# Patient Record
Sex: Male | Born: 1935 | Race: Black or African American | Hispanic: No | Marital: Single | Smoking: Former smoker
Health system: Southern US, Community
[De-identification: ages and names within clinical notes are randomized; demographics above are authoritative.]

## PROBLEM LIST (undated history)

## (undated) DIAGNOSIS — L89309 Pressure ulcer of unspecified buttock, unspecified stage: Secondary | ICD-10-CM | POA: Diagnosis present

## (undated) DIAGNOSIS — D649 Anemia, unspecified: Secondary | ICD-10-CM | POA: Diagnosis present

## (undated) DIAGNOSIS — N390 Urinary tract infection, site not specified: Secondary | ICD-10-CM | POA: Diagnosis present

## (undated) DIAGNOSIS — R1310 Dysphagia, unspecified: Secondary | ICD-10-CM | POA: Diagnosis present

## (undated) DIAGNOSIS — E7800 Pure hypercholesterolemia, unspecified: Secondary | ICD-10-CM

## (undated) DIAGNOSIS — Z87891 Personal history of nicotine dependence: Secondary | ICD-10-CM

## (undated) DIAGNOSIS — E119 Type 2 diabetes mellitus without complications: Secondary | ICD-10-CM | POA: Diagnosis present

## (undated) DIAGNOSIS — G9340 Encephalopathy, unspecified: Secondary | ICD-10-CM

## (undated) DIAGNOSIS — M1990 Unspecified osteoarthritis, unspecified site: Secondary | ICD-10-CM

## (undated) DIAGNOSIS — E785 Hyperlipidemia, unspecified: Secondary | ICD-10-CM | POA: Diagnosis present

## (undated) DIAGNOSIS — Y92009 Unspecified place in unspecified non-institutional (private) residence as the place of occurrence of the external cause: Secondary | ICD-10-CM

## (undated) DIAGNOSIS — Z960 Presence of urogenital implants: Secondary | ICD-10-CM

## (undated) DIAGNOSIS — I639 Cerebral infarction, unspecified: Secondary | ICD-10-CM

## (undated) DIAGNOSIS — I10 Essential (primary) hypertension: Secondary | ICD-10-CM | POA: Diagnosis present

## (undated) DIAGNOSIS — L8992 Pressure ulcer of unspecified site, stage 2: Secondary | ICD-10-CM | POA: Diagnosis present

## (undated) DIAGNOSIS — T83511A Infection and inflammatory reaction due to indwelling urethral catheter, initial encounter: Principal | ICD-10-CM | POA: Diagnosis present

## (undated) DIAGNOSIS — L8993 Pressure ulcer of unspecified site, stage 3: Secondary | ICD-10-CM | POA: Diagnosis present

## (undated) DIAGNOSIS — I4891 Unspecified atrial fibrillation: Secondary | ICD-10-CM

## (undated) DIAGNOSIS — L8995 Pressure ulcer of unspecified site, unstageable: Secondary | ICD-10-CM | POA: Diagnosis present

## (undated) DIAGNOSIS — Z978 Presence of other specified devices: Secondary | ICD-10-CM

## (undated) DIAGNOSIS — L89109 Pressure ulcer of unspecified part of back, unspecified stage: Secondary | ICD-10-CM | POA: Diagnosis present

## (undated) DIAGNOSIS — S2239XA Fracture of one rib, unspecified side, initial encounter for closed fracture: Secondary | ICD-10-CM

## (undated) DIAGNOSIS — I69959 Hemiplegia and hemiparesis following unspecified cerebrovascular disease affecting unspecified side: Secondary | ICD-10-CM

## (undated) DIAGNOSIS — E860 Dehydration: Secondary | ICD-10-CM | POA: Diagnosis present

## (undated) DIAGNOSIS — J189 Pneumonia, unspecified organism: Secondary | ICD-10-CM

## (undated) DIAGNOSIS — L89609 Pressure ulcer of unspecified heel, unspecified stage: Secondary | ICD-10-CM | POA: Diagnosis present

## (undated) DIAGNOSIS — Y846 Urinary catheterization as the cause of abnormal reaction of the patient, or of later complication, without mention of misadventure at the time of the procedure: Secondary | ICD-10-CM | POA: Diagnosis present

## (undated) HISTORY — PX: NO PAST SURGERIES: SHX2092

---

## 2013-12-15 DIAGNOSIS — J189 Pneumonia, unspecified organism: Secondary | ICD-10-CM

## 2013-12-15 HISTORY — DX: Pneumonia, unspecified organism: J18.9

## 2013-12-28 ENCOUNTER — Emergency Department: Payer: Self-pay | Admitting: Emergency Medicine

## 2013-12-28 LAB — URINALYSIS, COMPLETE
BACTERIA: NONE SEEN
BILIRUBIN, UR: NEGATIVE
Glucose,UR: NEGATIVE mg/dL (ref 0–75)
Leukocyte Esterase: NEGATIVE
NITRITE: NEGATIVE
Ph: 5 (ref 4.5–8.0)
RBC,UR: 8 /HPF (ref 0–5)
Specific Gravity: 1.029 (ref 1.003–1.030)
Squamous Epithelial: NONE SEEN
WBC UR: 1 /HPF (ref 0–5)

## 2013-12-28 LAB — COMPREHENSIVE METABOLIC PANEL
ANION GAP: 12 (ref 7–16)
Albumin: 3.7 g/dL (ref 3.4–5.0)
Alkaline Phosphatase: 74 U/L
BUN: 28 mg/dL — AB (ref 7–18)
Bilirubin,Total: 0.7 mg/dL (ref 0.2–1.0)
CALCIUM: 9.5 mg/dL (ref 8.5–10.1)
CHLORIDE: 101 mmol/L (ref 98–107)
CO2: 24 mmol/L (ref 21–32)
CREATININE: 0.96 mg/dL (ref 0.60–1.30)
EGFR (African American): >60
Glucose: 103 mg/dL — ABNORMAL HIGH (ref 65–99)
OSMOLALITY: 280 (ref 275–301)
Potassium: 4.4 mmol/L (ref 3.5–5.1)
SGOT(AST): 112 U/L — ABNORMAL HIGH (ref 15–37)
SGPT (ALT): 40 U/L (ref 12–78)
SODIUM: 137 mmol/L (ref 136–145)
TOTAL PROTEIN: 7.4 g/dL (ref 6.4–8.2)

## 2013-12-28 LAB — CBC
HCT: 52.7 % — AB (ref 40.0–52.0)
HGB: 17.5 g/dL (ref 13.0–18.0)
MCH: 31.5 pg (ref 26.0–34.0)
MCHC: 33.2 g/dL (ref 32.0–36.0)
MCV: 95 fL (ref 80–100)
PLATELETS: 328 10*3/uL (ref 150–440)
RBC: 5.56 10*6/uL (ref 4.40–5.90)
RDW: 13.6 % (ref 11.5–14.5)
WBC: 10.6 10*3/uL (ref 3.8–10.6)

## 2013-12-28 LAB — MAGNESIUM: MAGNESIUM: 1.8 mg/dL

## 2013-12-28 LAB — CK TOTAL AND CKMB (NOT AT ARMC)
CK, Total: 2202 U/L — ABNORMAL HIGH
CK-MB: 54.6 ng/mL — AB (ref 0.5–3.6)

## 2013-12-28 LAB — PROTIME-INR
INR: 1.2
PROTHROMBIN TIME: 14.7 s (ref 11.5–14.7)

## 2013-12-28 LAB — TROPONIN I: TROPONIN-I: 0.05 ng/mL

## 2013-12-29 DIAGNOSIS — S2239XA Fracture of one rib, unspecified side, initial encounter for closed fracture: Secondary | ICD-10-CM

## 2013-12-29 DIAGNOSIS — I639 Cerebral infarction, unspecified: Secondary | ICD-10-CM

## 2013-12-29 HISTORY — DX: Cerebral infarction, unspecified: I63.9

## 2013-12-29 HISTORY — DX: Fracture of one rib, unspecified side, initial encounter for closed fracture: S22.39XA

## 2014-01-09 ENCOUNTER — Emergency Department (HOSPITAL_COMMUNITY): Payer: Medicare Other

## 2014-01-09 ENCOUNTER — Inpatient Hospital Stay (HOSPITAL_COMMUNITY): Payer: Medicare Other

## 2014-01-09 ENCOUNTER — Other Ambulatory Visit: Payer: Self-pay

## 2014-01-09 ENCOUNTER — Inpatient Hospital Stay (HOSPITAL_COMMUNITY)
Admission: EM | Admit: 2014-01-09 | Discharge: 2014-01-14 | DRG: 189 | Disposition: A | Discharge status: Skilled Nursing Facility | Payer: Medicare Other | Attending: Internal Medicine | Admitting: Internal Medicine

## 2014-01-09 ENCOUNTER — Encounter (HOSPITAL_COMMUNITY): Payer: Self-pay | Admitting: Emergency Medicine

## 2014-01-09 DIAGNOSIS — E785 Hyperlipidemia, unspecified: Secondary | ICD-10-CM | POA: Diagnosis present

## 2014-01-09 DIAGNOSIS — D72829 Elevated white blood cell count, unspecified: Secondary | ICD-10-CM | POA: Diagnosis present

## 2014-01-09 DIAGNOSIS — I69991 Dysphagia following unspecified cerebrovascular disease: Secondary | ICD-10-CM

## 2014-01-09 DIAGNOSIS — J9600 Acute respiratory failure, unspecified whether with hypoxia or hypercapnia: Principal | ICD-10-CM | POA: Diagnosis present

## 2014-01-09 DIAGNOSIS — J9691 Respiratory failure, unspecified with hypoxia: Secondary | ICD-10-CM | POA: Diagnosis present

## 2014-01-09 DIAGNOSIS — I69959 Hemiplegia and hemiparesis following unspecified cerebrovascular disease affecting unspecified side: Secondary | ICD-10-CM

## 2014-01-09 DIAGNOSIS — J189 Pneumonia, unspecified organism: Secondary | ICD-10-CM | POA: Diagnosis present

## 2014-01-09 DIAGNOSIS — I4891 Unspecified atrial fibrillation: Secondary | ICD-10-CM | POA: Diagnosis present

## 2014-01-09 DIAGNOSIS — Z7901 Long term (current) use of anticoagulants: Secondary | ICD-10-CM

## 2014-01-09 DIAGNOSIS — Z823 Family history of stroke: Secondary | ICD-10-CM

## 2014-01-09 DIAGNOSIS — G9349 Other encephalopathy: Secondary | ICD-10-CM | POA: Diagnosis present

## 2014-01-09 DIAGNOSIS — G9340 Encephalopathy, unspecified: Secondary | ICD-10-CM | POA: Diagnosis present

## 2014-01-09 DIAGNOSIS — E119 Type 2 diabetes mellitus without complications: Secondary | ICD-10-CM | POA: Diagnosis present

## 2014-01-09 DIAGNOSIS — M25569 Pain in unspecified knee: Secondary | ICD-10-CM | POA: Diagnosis present

## 2014-01-09 DIAGNOSIS — R1310 Dysphagia, unspecified: Secondary | ICD-10-CM | POA: Diagnosis present

## 2014-01-09 DIAGNOSIS — I6340 Cerebral infarction due to embolism of unspecified cerebral artery: Secondary | ICD-10-CM | POA: Diagnosis present

## 2014-01-09 DIAGNOSIS — Z79899 Other long term (current) drug therapy: Secondary | ICD-10-CM

## 2014-01-09 DIAGNOSIS — I69992 Facial weakness following unspecified cerebrovascular disease: Secondary | ICD-10-CM

## 2014-01-09 DIAGNOSIS — I10 Essential (primary) hypertension: Secondary | ICD-10-CM | POA: Diagnosis present

## 2014-01-09 DIAGNOSIS — E870 Hyperosmolality and hypernatremia: Secondary | ICD-10-CM | POA: Diagnosis present

## 2014-01-09 DIAGNOSIS — I639 Cerebral infarction, unspecified: Secondary | ICD-10-CM

## 2014-01-09 HISTORY — DX: Fracture of one rib, unspecified side, initial encounter for closed fracture: S22.39XA

## 2014-01-09 HISTORY — DX: Unspecified atrial fibrillation: I48.91

## 2014-01-09 HISTORY — DX: Cerebral infarction, unspecified: I63.9

## 2014-01-09 HISTORY — DX: Essential (primary) hypertension: I10

## 2014-01-09 LAB — CBC WITH DIFFERENTIAL/PLATELET
BASOS ABS: 0 10*3/uL (ref 0.0–0.1)
Basophils Relative: 0 % (ref 0–1)
EOS ABS: 0 10*3/uL (ref 0.0–0.7)
Eosinophils Relative: 0 % (ref 0–5)
HCT: 37.9 % — ABNORMAL LOW (ref 39.0–52.0)
Hemoglobin: 13.1 g/dL (ref 13.0–17.0)
LYMPHS ABS: 1.8 10*3/uL (ref 0.7–4.0)
Lymphocytes Relative: 6 % — ABNORMAL LOW (ref 12–46)
MCH: 32.3 pg (ref 26.0–34.0)
MCHC: 34.6 g/dL (ref 30.0–36.0)
MCV: 93.6 fL (ref 78.0–100.0)
MONO ABS: 1.5 10*3/uL — AB (ref 0.1–1.0)
MONOS PCT: 5 % (ref 3–12)
Neutro Abs: 27.1 10*3/uL — ABNORMAL HIGH (ref 1.7–7.7)
Neutrophils Relative %: 89 % — ABNORMAL HIGH (ref 43–77)
PLATELETS: 415 10*3/uL — AB (ref 150–400)
RBC: 4.05 MIL/uL — ABNORMAL LOW (ref 4.22–5.81)
RDW: 13.5 % (ref 11.5–15.5)
WBC: 30.4 10*3/uL — AB (ref 4.0–10.5)

## 2014-01-09 LAB — URINALYSIS, ROUTINE W REFLEX MICROSCOPIC
Bilirubin Urine: NEGATIVE
GLUCOSE, UA: NEGATIVE mg/dL
Hgb urine dipstick: NEGATIVE
Ketones, ur: NEGATIVE mg/dL
LEUKOCYTES UA: NEGATIVE
Nitrite: NEGATIVE
PH: 6 (ref 5.0–8.0)
Protein, ur: NEGATIVE mg/dL
Specific Gravity, Urine: 1.027 (ref 1.005–1.030)
Urobilinogen, UA: 1 mg/dL (ref 0.0–1.0)

## 2014-01-09 LAB — COMPREHENSIVE METABOLIC PANEL
ALBUMIN: 2.5 g/dL — AB (ref 3.5–5.2)
ALT: 80 U/L — ABNORMAL HIGH (ref 0–53)
AST: 90 U/L — ABNORMAL HIGH (ref 0–37)
Alkaline Phosphatase: 173 U/L — ABNORMAL HIGH (ref 39–117)
BUN: 33 mg/dL — AB (ref 6–23)
CALCIUM: 9.3 mg/dL (ref 8.4–10.5)
CHLORIDE: 100 meq/L (ref 96–112)
CO2: 22 mEq/L (ref 19–32)
CREATININE: 1.23 mg/dL (ref 0.50–1.35)
GFR calc Af Amer: 61 mL/min — ABNORMAL LOW (ref >90)
GFR calc non Af Amer: 52 mL/min — ABNORMAL LOW (ref >90)
Glucose, Bld: 149 mg/dL — ABNORMAL HIGH (ref 70–99)
Potassium: 5.3 mEq/L (ref 3.7–5.3)
Sodium: 137 mEq/L (ref 137–147)
Total Bilirubin: 0.3 mg/dL (ref 0.3–1.2)
Total Protein: 7.1 g/dL (ref 6.0–8.3)

## 2014-01-09 LAB — PROTIME-INR
INR: 1.23 (ref 0.00–1.49)
PROTHROMBIN TIME: 15.2 s (ref 11.6–15.2)

## 2014-01-09 LAB — I-STAT CG4 LACTIC ACID, ED: LACTIC ACID, VENOUS: 1.58 mmol/L (ref 0.5–2.2)

## 2014-01-09 MED ORDER — PIPERACILLIN-TAZOBACTAM 3.375 G IVPB 30 MIN
3.3750 g | Freq: Once | INTRAVENOUS | Status: AC
  Administered 2014-01-09: 3.375 g via INTRAVENOUS
  Filled 2014-01-09: qty 50

## 2014-01-09 MED ORDER — VANCOMYCIN HCL 10 G IV SOLR
1500.0000 mg | Freq: Once | INTRAVENOUS | Status: AC
  Administered 2014-01-09: 1500 mg via INTRAVENOUS
  Filled 2014-01-09: qty 1500

## 2014-01-09 NOTE — ED Provider Notes (Signed)
CSN: [id number]     Arrival date & time 01/09/14  1809 History   First MD Initiated Contact with Patient 01/09/14 1810     Chief Complaint  Patient presents with  . Altered Mental Status  . Fever    HPI 80 year old male presents with fever and altered male status. He was admitted to [facility] a few days ago with a stroke. He was found laying in the floor covered in urine. His family had not seen him for about 3 days and they called the police. The police had to break the door down. He was found to have a right MCA ischemic stroke and was newly diagnosed with atrial fibrillation. Anticoagulation was deferred due to the fact that he had an epidural placed for pain management during his hospitalization pain control related to multiple rib fractures from his fall. He has dysphagia, mild left-sided facial droop, and dense left hemiparesis from his stroke.   He was brought in today by family from his nursing home via EMS. They are concerned that he has been lethargic, slightly confused and disoriented. He reportedly had a fever of 101.8 at the nursing home earlier today and nursing home notes indicate that he was hypoxic to 81%. Of note he is satting 90% on room air currently in the emergency department.  The history is limited as the patient is slightly altered. He is oriented to person, time, situation. He thinks that he is in the hospital at [facility] currently. He is somnolent and falls asleep easily during conversation and has poor attention. The history is mostly obtained from EMS and from his daughter. Also obtained records from [facility] for further history. He denies headache, chest pain, abdominal pain, nausea, vomiting. He denies dysuria.    Past Medical History  Diagnosis Date  . Stroke   . A-fib   . Diabetes mellitus without complication   . Hypertension   . Rib fracture    No past surgical history on file. No family history on file. History  Substance Use Topics  . Smoking status: Not  on file  . Smokeless tobacco: Not on file  . Alcohol Use: Not on file    Review of Systems  Unable to perform ROS: Mental status change      Allergies  Review of patient's allergies indicates no known allergies.  Home Medications   No current outpatient prescriptions on file. BP 145/60  Pulse 77  Temp(Src) 98.5 F (36.9 C) (Oral)  Resp 16  Ht 5\' 11"  (1.803 m)  Wt 188 lb 9.6 oz (85.548 kg)  BMI 26.32 kg/m2  SpO2 100% Physical Exam  Nursing note and vitals reviewed. Constitutional: He appears well-developed. No distress.  Ill appearing, lethargic, disoriented  HENT:  Head: Normocephalic and atraumatic.  Mouth/Throat: Oropharynx is clear and moist.  L facial droop  Eyes: Conjunctivae are normal. Pupils are equal, round, and reactive to light.  Neck: Normal range of motion. Neck supple. No JVD present.  Cardiovascular: Normal rate and intact distal pulses.  An irregularly irregular rhythm present. Exam reveals no gallop and no friction rub.   No murmur heard. Pulmonary/Chest: No accessory muscle usage. Tachypnea noted. No respiratory distress. He has no decreased breath sounds. He has no wheezes. He has rhonchi.  Abdominal: Soft. He exhibits no distension. There is no tenderness. There is no rebound and no guarding.  Musculoskeletal: He exhibits no edema.  Neurological:  Lethargic; oriented to person but not place, time, or situation.  Disordered thinking.  Poor attention.  Dense L upper and lower extremitiy hemiparesis.  L facial droop.  Protecting his airway, handling secretions.  Skin: Skin is warm and dry.    ED Course  Procedures (including critical care time) Labs Review Labs Reviewed  CBC WITH DIFFERENTIAL - Abnormal; Notable for the following:    WBC 30.4 (*)    RBC 4.05 (*)    HCT 37.9 (*)    Platelets 415 (*)    Neutrophils Relative % 89 (*)    Lymphocytes Relative 6 (*)    Neutro Abs 27.1 (*)    Monocytes Absolute 1.5 (*)    All other components  within normal limits  COMPREHENSIVE METABOLIC PANEL - Abnormal; Notable for the following:    Glucose, Bld 149 (*)    BUN 33 (*)    Albumin 2.5 (*)    AST 90 (*)    ALT 80 (*)    Alkaline Phosphatase 173 (*)    GFR calc non Af Amer 52 (*)    GFR calc Af Amer 61 (*)    All other components within normal limits  CULTURE, BLOOD (ROUTINE X 2)  CULTURE, BLOOD (ROUTINE X 2)  URINALYSIS, ROUTINE W REFLEX MICROSCOPIC  PROTIME-INR  I-STAT CG4 LACTIC ACID, ED   Imaging Review Dg Chest 2 View  01/09/2014   CLINICAL DATA:  Altered mental status.  Fever.  EXAM: CHEST  2 VIEW  COMPARISON:  CT CHEST W/O CM dated 12/28/2013  FINDINGS: The cardiac silhouette is enlarged. The lungs are mildly hypoinflated with mild bibasilar opacities. No pleural effusion or pneumothorax is identified. Minimally displaced left-sided rib fractures are better demonstrated on recent CT.  IMPRESSION: Hypoinflation with mild bibasilar opacity, likely atelectasis. No pneumothorax identified.   Electronically Signed   By: Sebastian AcheAllen  Grady   On: 01/09/2014 19:38   Ct Head Wo Contrast  01/09/2014   CLINICAL DATA:  Altered mental status and fever.  EXAM: CT HEAD WITHOUT CONTRAST  TECHNIQUE: Contiguous axial images were obtained from the base of the skull through the vertex without intravenous contrast.  COMPARISON:  12/28/2013  FINDINGS: Hypoattenuation involving the right basal ganglia is stable to slightly decreased compared to the prior study. Periventricular white matter hypodensities do not appear significantly changed and are compatible with moderate chronic small vessel ischemic disease. There is no evidence of acute intracranial hemorrhage. Cerebral atrophy is unchanged. There is no midline shift or extra-axial fluid collection. Orbits are unremarkable. Mastoid air cells are clear. Small amount of frothy secretion is present in the left sphenoid sinus.  IMPRESSION: 1. No evidence of acute intracranial hemorrhage or definite new  infarct. 2. Stable to slightly decreased prominence right basal ganglia hypoattenuation, which may reflect evolving infarct.   Electronically Signed   By: Sebastian AcheAllen  Grady   On: 01/09/2014 20:41     EKG Interpretation None      MDM   80 yo M, [facility] with [facility] and multiple rib fractures, here with acute encephalopathy, febrile illness, and hypoxia.    On exam, he is afebrile here, but reported T of 101.8 at nursing facility prior to arrival.  He is mildly tachypneic. sats on room air, 90% (no prior O2 requirement).  Normal BP in the 100s/50s.  He is lethargic.  Oriented to person.  Not place, or time or situation.  Poor concentration.  Disordered thinking.  Ill appearing.  No distress.  No resp distress.  L facial droop and dense LUE and LLE  hemiparesis (similar to discharge exam documented at discharge from [facility], unchanged since discharge per his daughter).    Obtained blood cultures.   Gave vancomycin, zosyn, and IVF bolus. I reviewed all labs and imaging as detailed above. ED workup pertinent for: leukocytosis, WBC 30.  Elevated BUN to 30.  Mildly elevated transaminasis.  Normal lactate.  Normal urine.  CT head with no new hemorrhage; possible small evolving R basal ganglia infarct, but similar to CT from [facility].    Impression: Final diagnoses:  Acute encephalopathy  Atrial fibrillation  HTN (hypertension)  Leukocytosis  PNA (pneumonia)  Respiratory failure with hypoxia   Admitted to hospitalist for further management.    Toney Sang, MD 01/10/14 1535

## 2014-01-09 NOTE — H&P (Addendum)
[facility] History and Physical  Angie Favaeter Breon ZOX:[id number] DOB: 08/28/1928 DOA: 01/09/2014  Referring physician: ER physician PCP: No primary provider on file.   Chief Complaint: altered mental status  HPI:  80 year old male with past medical history of recent CVA (right MCA stroke), [facility], was found to be in atrial fibrillation and is now on Eliquis for anticoagulation who [facility]09[facility] with complaints of increased lethargy and confusion as noted by his family for past 24 hours. Patient is from nursing home and per SNF report patient had a fever there of 101.8 F and his oxygen saturation was 81%. There was no respiratory distress. No complaints of chest pain, palpitations of shortness of breath. No cough. No abdominal pain, no nausea or vomiting. No diarrhea and no blood in stool or urine.  In ED, BP was 107/55, HR 80, Tmax 100.108F and oxygen saturation 91% on 2 L [place] oxygen support. CXR shoed possible basilar infiltrates. CT head did not reveal acute intracranial findings but it did show possible evolving infarct in right basal ganglia.  Blood work revealed leukocytosis of 30 otherwise unremarkable. Urinalysis did not show evidence of an infection. Patient was started on broad spectrum antibiotics for possible pneumonia.   Assessment and Plan:  Principal Problem:   Acute respiratory failure with hypoxia - likely secondary to pneumonia (HCAP) - patient was started on broad spectrum antibiotics, vanco and zosyn - resp status is stable at this time - continue oxygen support via nasal canula to keep O2 saturation above 90% Active Problems:   Acute encephalopathy - possible related to pneumonia but evolving infarct also possible - treatment is with vanco and zosyn for pneumonia; would also order MRI brain  - PT/OT eval once pt able to participate - also needs SLP eval prior to initiating PO meds   Possible CVA - evolving infarct seen on CT  head - obtain MRI brain; will see if neurology needs to be consulted once results are available   HCAP - pneumonia order set in place - continue vanco and zosyn - follow up blood culture results, legionella, influenza, strep pneumoniae results - oxygen support via nasal canula to keep O2 saturation above 90%   Atrial fibrillation - continue coreg and Eliquis   Left knee pain - obtain x ray    HTN (hypertension) - may continue coreg but hold norvasc as BP somewhat soft 107/55   Dyslipidemia - continue statin therapy   Leukocytosis - likely due to pneumonia - management as above   Radiological Exams on Admission: Dg Chest 2 View 01/09/2014 IMPRESSION: Hypoinflation with mild bibasilar opacity, likely atelectasis. No pneumothorax identified.     Ct Head Wo Contrast 01/09/2014   IMPRESSION: 1. No evidence of acute intracranial hemorrhage or definite new infarct. 2. Stable to slightly decreased prominence right basal ganglia hypoattenuation, which may reflect evolving infarct.    Code Status: Full Family Communication: Pt at bedside Disposition Plan: Admit for further evaluation  Manson PasseyEVINE, Alver Leete, MD  [facility] Pager [phone]  Review of Systems:  Constitutional: positive for fever, chills and malaise/fatigue. Negative for diaphoresis.  HENT: Negative for hearing loss, ear pain, nosebleeds, congestion, sore throat, neck pain, tinnitus and ear discharge.   Eyes: Negative for blurred vision, double vision, photophobia, pain, discharge and redness.  Respiratory: Negative for cough, hemoptysis, sputum production, shortness of breath, wheezing and stridor.   Cardiovascular: Negative for chest pain, palpitations, orthopnea, claudication and leg swelling.  Gastrointestinal: Negative for  nausea, vomiting and abdominal pain. Negative for heartburn, constipation, blood in stool and melena.  Genitourinary: Negative for dysuria, urgency, frequency, hematuria and flank pain.   Musculoskeletal: left knee pain  Skin: Negative for itching and rash.  Neurological: positive for confusion, lethargy Endo/Heme/Allergies: Negative for environmental allergies and polydipsia. Does not bruise/bleed easily.  Psychiatric/Behavioral: Negative for suicidal ideas. The patient is not nervous/anxious.      Past Medical History  Diagnosis Date  . Stroke   . A-fib   . Diabetes mellitus without complication   . Hypertension   . Rib fracture    No past surgical history on file. Social History:  has no tobacco, alcohol, and drug history on file.  No Known Allergies  Family History: Family medical history significant for CVA in brother, mother   Prior to Admission medications   Medication Sig Start Date End Date Taking? Authorizing Provider  acetaminophen (TYLENOL) 325 MG tablet Take 975 mg by mouth 3 (three) times daily.   Yes Historical Provider, MD  amLODipine (NORVASC) 10 MG tablet Take 10 mg by mouth daily.   Yes Historical Provider, MD  amoxicillin-clavulanate (AUGMENTIN) 875-125 MG per tablet Take 1 tablet by mouth 2 (two) times daily. For 5 days for UTI.  Started 01/09/14   Yes Historical Provider, MD  apixaban (ELIQUIS) 5 MG TABS tablet Take 5 mg by mouth 2 (two) times daily.   Yes Historical Provider, MD  atorvastatin (LIPITOR) 40 MG tablet Take 40 mg by mouth daily.   Yes Historical Provider, MD  brinzolamide (AZOPT) 1 % ophthalmic suspension Place 1 drop into both eyes 2 (two) times daily.   Yes Historical Provider, MD  Brinzolamide-Brimonidine 1-0.2 % SUSP Place 1 drop into both eyes 3 (three) times daily.   Yes Historical Provider, MD  carvedilol (COREG) 12.5 MG tablet Take 12.5 mg by mouth 2 (two) times daily with a meal.   Yes Historical Provider, MD  lidocaine (LIDODERM) 5 % Place 1 patch onto the skin daily. Remove & Discard patch within 12 hours or as directed by MD   Yes Historical Provider, MD  oxyCODONE (OXY IR/ROXICODONE) 5 MG immediate release tablet Take  5 mg by mouth every 4 (four) hours as needed for severe pain.   Yes Historical Provider, MD  polyethylene glycol (MIRALAX / GLYCOLAX) packet Take 17 g by mouth daily.   Yes Historical Provider, MD  sennosides-docusate sodium (SENOKOT-S) 8.6-50 MG tablet Take 2 tablets by mouth 2 (two) times daily.   Yes Historical Provider, MD  traMADol (ULTRAM) 50 MG tablet Take 25 mg by mouth every 8 (eight) hours.   Yes Historical Provider, MD  travoprost, benzalkonium, (TRAVATAN) 0.004 % ophthalmic solution Place 1 drop into both eyes at bedtime.   Yes Historical Provider, MD   Physical Exam: Filed Vitals:   01/09/14 1819 01/09/14 1835 01/09/14 2000 01/09/14 2050  BP:  109/53 107/55 108/67  Pulse:  80 71 66  Temp:  100.8 F (38.2 C)    TempSrc:  Rectal    Resp:  22 25 16   SpO2: 98% 95% 96% 99%    Physical Exam  Constitutional: Appears in no acute distress  HENT: Normocephalic. Dry mucus membranes Eyes: Conjunctivae are normal, PERRLA Neck: Normal ROM. Neck supple. No JVD. No tracheal deviation.  CVS: irregular rhythm, rate controlled, S1/S2 appreciated Pulmonary: decreased breath sounds but no wheezing appreciated  Abdominal: Soft. BS +,  no distension, tenderness, rebound or guarding.  Musculoskeletal: Normal range of motion. No edema and  no tenderness.  Lymphadenopathy: No lymphadenopathy noted, cervical, inguinal. Neuro: Alert. Left facial droop Skin: Skin is warm and dry. Psychiatric: Normal mood and affect.  Labs on Admission:  Basic Metabolic Panel:  Recent Labs Lab 01/09/14 1840  NA 137  K 5.3  CL 100  CO2 22  GLUCOSE 149*  BUN 33*  CREATININE 1.23  CALCIUM 9.3   Liver Function Tests:  Recent Labs Lab 01/09/14 1840  AST 90*  ALT 80*  ALKPHOS 173*  BILITOT 0.3  PROT 7.1  ALBUMIN 2.5*   No results found for this basename: LIPASE, AMYLASE,  in the last 168 hours No results found for this basename: AMMONIA,  in the last 168 hours CBC:  Recent Labs Lab  01/09/14 1840  WBC 30.4*  NEUTROABS 27.1*  HGB 13.1  HCT 37.9*  MCV 93.6  PLT 415*   Cardiac Enzymes: No results found for this basename: CKTOTAL, CKMB, CKMBINDEX, TROPONINI,  in the last 168 hours BNP: No components found with this basename: POCBNP,  CBG: No results found for this basename: GLUCAP,  in the last 168 hours  If 7PM-7AM, please contact night-coverage [email or web address] Password [facility] 01/09/2014, 9:11 PM

## 2014-01-09 NOTE — ED Notes (Signed)
Pt asked if he knew where he was, or the date. Responded "in the hospital, September 10 2014." When asked if he knew who the president was. "obama" A&O X 2

## 2014-01-09 NOTE — Progress Notes (Signed)
ANTIBIOTIC CONSULT NOTE - INITIAL  Pharmacy Consult for Zosyn, Vancomycin Indication: Fever with leukocytosis   No Known Allergies  Patient Measurements:   Adjusted Body Weight: n/a  Vital Signs: Temp: 100.8 F (38.2 C) (03/26 1835) Temp src: Rectal (03/26 1835) BP: 117/64 mmHg (03/26 2100) Pulse Rate: 60 (03/26 2100) Intake/Output from previous day:   Intake/Output from this shift:    Labs:  Recent Labs  01/09/14 1840  WBC 30.4*  HGB 13.1  PLT 415*  CREATININE 1.23   CrCl is unknown because there is no height on file for the current visit. No results found for this basename: VANCOTROUGH, VANCOPEAK, VANCORANDOM, GENTTROUGH, GENTPEAK, GENTRANDOM, TOBRATROUGH, TOBRAPEAK, TOBRARND, AMIKACINPEAK, AMIKACINTROU, AMIKACIN,  in the last 72 hours   Microbiology: No results found for this or any previous visit (from the past 720 hour(s)).  Medical History: Past Medical History  Diagnosis Date  . Stroke   . A-fib   . Diabetes mellitus without complication   . Hypertension   . Rib fracture     Medications:   (Not in a hospital admission) Assessment: [age] YOM presents with fever and AMS. He was [facility] a few days ago with a stroke. He had a temp of 100.8 in the ED and is somnolent and falls asleep easily during conversation. WBC elevated at 30.4. SCr 1.23. CrCl ~ 48.5. UA unimpressive. CXR note seems to show no evidence of pneumonia   Cultures: 3/26 Blood Cx x2>>  Abx doses given in ED: 1) Vancomycin 1500 mg IV x 1 dose  2) Zosyn 3.375 gm IV x 1 dose   Goal of Therapy:  Vancomycin trough level 15-20 mcg/ml  Plan:  1) Continue Vancomycin at maintenance dose of 750 mg IV Q 12 hours  2) Continue Zosyn 3.373 gm IV Q 8 hours  3) Monitor CBC, renal fx, cultures and patient's clinical progress 4) Collect vancomycin trough as necessary    Vinnie LevelBenjamin Zyion Doxtater, PharmD.  Clinical Pharmacist Pager [phone]

## 2014-01-09 NOTE — ED Notes (Signed)
Pt from [facility] via [facility] c/o increase lethargy, confusion, and fever.  [facility]/p found on floor x3 days with stroke.  [facility]  Pt in NAD.

## 2014-01-09 NOTE — ED Notes (Signed)
NOTIFIED DR. SHELDON OF PATIENTS LAB RESULTS OF CG4+ ,@ 1910 PM , 01/09/2014.

## 2014-01-10 ENCOUNTER — Inpatient Hospital Stay (HOSPITAL_COMMUNITY): Payer: Medicare Other

## 2014-01-10 DIAGNOSIS — J9600 Acute respiratory failure, unspecified whether with hypoxia or hypercapnia: Principal | ICD-10-CM

## 2014-01-10 DIAGNOSIS — J189 Pneumonia, unspecified organism: Secondary | ICD-10-CM

## 2014-01-10 LAB — CBC WITH DIFFERENTIAL/PLATELET
Basophils Absolute: 0 10*3/uL (ref 0.0–0.1)
Basophils Relative: 0 % (ref 0–1)
EOS ABS: 0.3 10*3/uL (ref 0.0–0.7)
Eosinophils Relative: 1 % (ref 0–5)
HCT: 38.4 % — ABNORMAL LOW (ref 39.0–52.0)
HEMOGLOBIN: 13.1 g/dL (ref 13.0–17.0)
Lymphocytes Relative: 10 % — ABNORMAL LOW (ref 12–46)
Lymphs Abs: 2.6 10*3/uL (ref 0.7–4.0)
MCH: 32.3 pg (ref 26.0–34.0)
MCHC: 34.1 g/dL (ref 30.0–36.0)
MCV: 94.8 fL (ref 78.0–100.0)
MONO ABS: 1.3 10*3/uL — AB (ref 0.1–1.0)
Monocytes Relative: 5 % (ref 3–12)
NEUTROS PCT: 84 % — AB (ref 43–77)
Neutro Abs: 22.1 10*3/uL — ABNORMAL HIGH (ref 1.7–7.7)
Platelets: 390 10*3/uL (ref 150–400)
RBC: 4.05 MIL/uL — ABNORMAL LOW (ref 4.22–5.81)
RDW: 13.4 % (ref 11.5–15.5)
WBC: 26.3 10*3/uL — ABNORMAL HIGH (ref 4.0–10.5)

## 2014-01-10 LAB — MAGNESIUM: MAGNESIUM: 2.3 mg/dL (ref 1.5–2.5)

## 2014-01-10 LAB — PROTIME-INR
INR: 1.25 (ref 0.00–1.49)
Prothrombin Time: 15.4 seconds — ABNORMAL HIGH (ref 11.6–15.2)

## 2014-01-10 LAB — MRSA PCR SCREENING: MRSA by PCR: NEGATIVE

## 2014-01-10 LAB — LIPID PANEL
CHOLESTEROL: 78 mg/dL (ref 0–200)
Cholesterol: 76 mg/dL (ref 0–200)
HDL: 21 mg/dL — ABNORMAL LOW (ref >39)
HDL: 21 mg/dL — ABNORMAL LOW (ref >39)
LDL CALC: 43 mg/dL (ref 0–99)
LDL Cholesterol: 45 mg/dL (ref 0–99)
TRIGLYCERIDES: 62 mg/dL (ref <150)
Total CHOL/HDL Ratio: 3.6 RATIO
Total CHOL/HDL Ratio: 3.7 RATIO
Triglycerides: 58 mg/dL (ref <150)
VLDL: 12 mg/dL (ref 0–40)
VLDL: 12 mg/dL (ref 0–40)

## 2014-01-10 LAB — LEGIONELLA ANTIGEN, URINE: LEGIONELLA ANTIGEN, URINE: NEGATIVE

## 2014-01-10 LAB — HEMOGLOBIN A1C
HEMOGLOBIN A1C: 6.8 % — AB (ref <5.7)
MEAN PLASMA GLUCOSE: 148 mg/dL — AB (ref <117)

## 2014-01-10 LAB — PHOSPHORUS: Phosphorus: 3.9 mg/dL (ref 2.3–4.6)

## 2014-01-10 LAB — INFLUENZA PANEL BY PCR (TYPE A & B)
H1N1 flu by pcr: NOT DETECTED
INFLAPCR: NEGATIVE
Influenza B By PCR: NEGATIVE

## 2014-01-10 LAB — COMPREHENSIVE METABOLIC PANEL
ALK PHOS: 156 U/L — AB (ref 39–117)
ALT: 67 U/L — ABNORMAL HIGH (ref 0–53)
AST: 63 U/L — ABNORMAL HIGH (ref 0–37)
Albumin: 2.5 g/dL — ABNORMAL LOW (ref 3.5–5.2)
BILIRUBIN TOTAL: 0.4 mg/dL (ref 0.3–1.2)
BUN: 30 mg/dL — ABNORMAL HIGH (ref 6–23)
CO2: 23 meq/L (ref 19–32)
Calcium: 9 mg/dL (ref 8.4–10.5)
Chloride: 101 mEq/L (ref 96–112)
Creatinine, Ser: 1.16 mg/dL (ref 0.50–1.35)
GFR calc Af Amer: 65 mL/min — ABNORMAL LOW (ref >90)
GFR calc non Af Amer: 56 mL/min — ABNORMAL LOW (ref >90)
GLUCOSE: 126 mg/dL — AB (ref 70–99)
POTASSIUM: 4.9 meq/L (ref 3.7–5.3)
Sodium: 138 mEq/L (ref 137–147)
Total Protein: 6.9 g/dL (ref 6.0–8.3)

## 2014-01-10 LAB — TSH: TSH: 2.31 u[IU]/mL (ref 0.350–4.500)

## 2014-01-10 LAB — APTT: aPTT: 43 seconds — ABNORMAL HIGH (ref 24–37)

## 2014-01-10 LAB — STREP PNEUMONIAE URINARY ANTIGEN: Strep Pneumo Urinary Antigen: NEGATIVE

## 2014-01-10 MED ORDER — APIXABAN 5 MG PO TABS
5.0000 mg | ORAL_TABLET | Freq: Two times a day (BID) | ORAL | Status: DC
  Administered 2014-01-10 – 2014-01-14 (×8): 5 mg via ORAL
  Filled 2014-01-10 (×11): qty 1

## 2014-01-10 MED ORDER — HYDROCODONE-ACETAMINOPHEN 5-325 MG PO TABS
1.0000 | ORAL_TABLET | ORAL | Status: DC | PRN
  Administered 2014-01-11 – 2014-01-14 (×5): 1 via ORAL
  Filled 2014-01-10 (×5): qty 1

## 2014-01-10 MED ORDER — ATORVASTATIN CALCIUM 40 MG PO TABS
40.0000 mg | ORAL_TABLET | Freq: Every day | ORAL | Status: DC
  Administered 2014-01-10 – 2014-01-14 (×5): 40 mg via ORAL
  Filled 2014-01-10 (×5): qty 1

## 2014-01-10 MED ORDER — ACETAMINOPHEN 650 MG RE SUPP
650.0000 mg | Freq: Four times a day (QID) | RECTAL | Status: DC | PRN
Start: 2014-01-10 — End: 2014-01-14

## 2014-01-10 MED ORDER — VANCOMYCIN HCL IN DEXTROSE 750-5 MG/150ML-% IV SOLN
750.0000 mg | Freq: Two times a day (BID) | INTRAVENOUS | Status: DC
  Administered 2014-01-10 – 2014-01-14 (×9): 750 mg via INTRAVENOUS
  Filled 2014-01-10 (×10): qty 150

## 2014-01-10 MED ORDER — SODIUM CHLORIDE 0.9 % IJ SOLN
3.0000 mL | Freq: Two times a day (BID) | INTRAMUSCULAR | Status: DC
  Administered 2014-01-12: 3 mL via INTRAVENOUS

## 2014-01-10 MED ORDER — PIPERACILLIN-TAZOBACTAM 3.375 G IVPB
3.3750 g | Freq: Three times a day (TID) | INTRAVENOUS | Status: DC
  Administered 2014-01-10 – 2014-01-14 (×13): 3.375 g via INTRAVENOUS
  Filled 2014-01-10 (×15): qty 50

## 2014-01-10 MED ORDER — TRAVOPROST (BAK FREE) 0.004 % OP SOLN
1.0000 [drp] | Freq: Every day | OPHTHALMIC | Status: DC
  Administered 2014-01-10 – 2014-01-13 (×5): 1 [drp] via OPHTHALMIC
  Filled 2014-01-10: qty 2.5

## 2014-01-10 MED ORDER — ONDANSETRON HCL 4 MG/2ML IJ SOLN: 4.0000 mg | Freq: Four times a day (QID) | INTRAMUSCULAR | Status: DC | PRN

## 2014-01-10 MED ORDER — SENNA-DOCUSATE SODIUM 8.6-50 MG PO TABS
2.0000 | ORAL_TABLET | Freq: Two times a day (BID) | ORAL | Status: DC
  Administered 2014-01-10 – 2014-01-14 (×9): 2 via ORAL
  Filled 2014-01-10 (×11): qty 2

## 2014-01-10 MED ORDER — SODIUM CHLORIDE 0.9 % IV SOLN
INTRAVENOUS | Status: AC
  Administered 2014-01-10: 01:00:00 via INTRAVENOUS

## 2014-01-10 MED ORDER — CARVEDILOL 12.5 MG PO TABS
12.5000 mg | ORAL_TABLET | Freq: Two times a day (BID) | ORAL | Status: DC
  Administered 2014-01-10 – 2014-01-14 (×9): 12.5 mg via ORAL
  Filled 2014-01-10 (×12): qty 1

## 2014-01-10 MED ORDER — ONDANSETRON HCL 4 MG PO TABS: 4.0000 mg | ORAL_TABLET | Freq: Four times a day (QID) | ORAL | Status: DC | PRN

## 2014-01-10 MED ORDER — TRAMADOL HCL 50 MG PO TABS
25.0000 mg | ORAL_TABLET | Freq: Three times a day (TID) | ORAL | Status: DC
  Administered 2014-01-10 – 2014-01-14 (×11): 25 mg via ORAL
  Filled 2014-01-10 (×13): qty 1

## 2014-01-10 MED ORDER — ACETAMINOPHEN 325 MG PO TABS
650.0000 mg | ORAL_TABLET | Freq: Four times a day (QID) | ORAL | Status: DC | PRN
  Administered 2014-01-11: 650 mg via ORAL
  Filled 2014-01-10: qty 2

## 2014-01-10 MED ORDER — POLYETHYLENE GLYCOL 3350 17 G PO PACK
17.0000 g | PACK | Freq: Every day | ORAL | Status: DC
  Administered 2014-01-10 – 2014-01-14 (×5): 17 g via ORAL
  Filled 2014-01-10 (×5): qty 1

## 2014-01-10 MED ORDER — BRINZOLAMIDE 1 % OP SUSP
1.0000 [drp] | Freq: Two times a day (BID) | OPHTHALMIC | Status: DC
Start: 2014-01-10 — End: 2014-01-14
  Administered 2014-01-10 – 2014-01-14 (×9): 1 [drp] via OPHTHALMIC
  Filled 2014-01-10: qty 10

## 2014-01-10 NOTE — Progress Notes (Signed)
PT Cancellation Note  Patient Details Name: Angie Favaeter Goulette MRN: [id number] DOB: 04/29/1928   Cancelled Treatment:    Reason Eval Not Completed: Patient at procedure or test/unavailable (SLP to work with pt and RN reports MRI coming to get him); also remains on strict bedrest.   Imelda Dandridge 01/10/2014, 9:53 AM Pager [phone]

## 2014-01-10 NOTE — Progress Notes (Signed)
OT Cancellation Note  Patient Details Name: Joel [name] MRN: [id number] DOB: 07/30/1928   Cancelled Treatment:    Reason Eval/Treat Not Completed: Other (comment);Patient at procedure or test/ unavailable. SLP to work with pt and RN reports MRI coming to get him; also remains on strict bedrest   Galen ManilaSpencer, Anara Cowman [name], [place]OT [phone] 01/10/2014, 9:58 AM

## 2014-01-10 NOTE — ED Provider Notes (Signed)
I saw and evaluated the patient, reviewed the resident's note and I agree with the findings and plan.   EKG Interpretation None      Pt with recently inpatient admission for stroke and rib fracture now in local SNF began to be confused and febrile today. Admit for probable pneumonia.   Joel [name] B. Bernette MayersSheldon, MD 01/10/14 [phone]

## 2014-01-10 NOTE — Progress Notes (Signed)
Utilization review completed.  

## 2014-01-10 NOTE — Evaluation (Signed)
Speech Language Pathology Evaluation Patient Details Name: Joel [name] MRN: [id number] DOB: 01/14/1928 Today's Date: 01/10/2014 Time: 1100-1130 SLP Time Calculation (min): 30 min  Problem List:  Patient Active Problem List   Diagnosis Date Noted  . Acute encephalopathy 01/09/2014  . Atrial fibrillation 01/09/2014  . HTN (hypertension) 01/09/2014  . Dyslipidemia 01/09/2014  . Leukocytosis 01/09/2014  . Respiratory failure with hypoxia 01/09/2014  . PNA (pneumonia) 01/09/2014   Past Medical History:  Past Medical History  Diagnosis Date  . Stroke   . A-fib   . Diabetes mellitus without complication   . Hypertension   . Rib fracture    Past Surgical History: No past surgical history on file. HPI:  80 year old male admitted 01/09/14 due to AMS.  PMH significant for R MCA CVA.   Assessment / Plan / Recommendation Clinical Impression  Pt able to attend to evaluation in quiet environment.  Oriented x4 at this time (except day/date). Recalled specific information given to pt after ~ 5 minute delay. Pt awareness and higher level cognition are anticipated to be impaired, given advanced age, RN indication of confusion, and reduced awareness of Left UE/LE functionality.    SLP Assessment  All further Speech Lanaguage Pathology  needs can be addressed in the next venue of care    Follow Up Recommendations  24 hour supervision/assistance;Skilled Nursing facility    Frequency and Duration min 1 x/week      Pertinent Vitals/Pain VSS, pt reported pain in left leg   SLP Goals   defer to SLP at next level of care  SLP Evaluation Prior Functioning  Cognitive/Linguistic Baseline: Information not available Type of Home: Skilled Nursing Facility Education: 11th grade Vocation: Retired   IT consultantCognition  Overall Cognitive Status: No family/caregiver present to determine baseline cognitive functioning Arousal/Alertness: Awake/alert Orientation Level: Oriented to person;Oriented to  place;Oriented to time;Oriented to situation Comments: Pt able to recall information after a delay, but was then confused re: living arrangements and if he had oxygen in place.     Comprehension  Auditory Comprehension Overall Auditory Comprehension: Appears within functional limits for tasks assessed    Expression Expression Primary Mode of Expression: Verbal Verbal Expression Overall Verbal Expression: Appears within functional limits for tasks assessed Written Expression Dominant Hand: Right   Oral / Motor Oral Motor/Sensory Function Overall Oral Motor/Sensory Function: Appears within functional limits for tasks assessed Motor Speech Overall Motor Speech: Appears within functional limits for tasks assessed   GO   Chundra Sauerwein B. Murvin NatalBueche, [facility] North HospitalMSP, CCC-SLP [phone]  Leigh AuroraBueche, Orva Riles Brown 01/10/2014, 12:13 PM

## 2014-01-10 NOTE — Progress Notes (Addendum)
[facility] PROGRESS NOTE  Joel [name] ZOX:[id number] DOB: [birth year] DOA: 01/09/2014 PCP: No primary provider on file.  Assessment/Plan: recent CVA (right MCA stroke), hospitalized at [facility], was found to be in atrial fibrillation and is now on Eliquis for anticoagulation who presented to [facility] ED from SNF with complaints of increased lethargy and confusion, fever is admitted with HCAP  Principal Problem:   1. HCAP/sepsis + productive cough, +fever; CXR: likely infiltrate  -cont IV atx, pending cultures; cont bronchodilator, oxygen as needed  -speech eval;   2. Acute respiratory failure with hypoxia likely secondary to pneumonia (HCAP); as above   3. Acute encephalopathy  possible related to pneumonia but evolving infarct also possible  -improving; cont current regimen; f/u MRI  4. Recent CVA(right MCA) possible evolving CVA; CT head: no acute infarct, but possible evolution cva -patient has residual L sided weakness, neglect;  -pend MRI brain; cont current regimen; will see if neurology needs to be consulted once results are available   5. Atrial fibrillation continue coreg and Eliquis   6. DJD, Left knee pain; cont pain control    7. HTN (hypertension)  - may continue coreg but hold norvasc as BP somewhat soft 107/55    Code Status: full Family Communication: d/w patient, called updated [name],Joel Daughter [phone] (indicate person spoken with, relationship, and if by phone, the number) Disposition Plan: likely snf pend clinical improvement    Consultants:  None   Procedures:  Pend MRI  Antibiotics:  Zosyn  3/26<<<<  vanc 3/26<<<<  (indicate start date, and stop date if known)  HPI/Subjective: alert  Objective: Filed Vitals:   01/10/14 0600  BP: 145/60  Pulse: 77  Temp: 98.5 F (36.9 C)  Resp: 16    Intake/Output Summary (Last 24 hours) at 01/10/14 1007 Last data filed at 01/10/14 0040  Gross per 24 hour  Intake      0 ml   Output    900 ml  Net   -900 ml   Filed Weights   01/09/14 2100 01/10/14 0034  Weight: 88.905 kg (196 lb) 85.548 kg (188 lb 9.6 oz)    Exam:   General:  alert  Cardiovascular: s1,s2 rrr  Respiratory: LL rales  Abdomen: soft, nt,nd   Musculoskeletal: no edema   Data Reviewed: Basic Metabolic Panel:  Recent Labs Lab 01/09/14 1840 01/10/14 0153  NA 137 138  K 5.3 4.9  CL 100 101  CO2 22 23  GLUCOSE 149* 126*  BUN 33* 30*  CREATININE 1.23 1.16  CALCIUM 9.3 9.0  MG  --  2.3  PHOS  --  3.9   Liver Function Tests:  Recent Labs Lab 01/09/14 1840 01/10/14 0153  AST 90* 63*  ALT 80* 67*  ALKPHOS 173* 156*  BILITOT 0.3 0.4  PROT 7.1 6.9  ALBUMIN 2.5* 2.5*   No results found for this basename: LIPASE, AMYLASE,  in the last 168 hours No results found for this basename: AMMONIA,  in the last 168 hours CBC:  Recent Labs Lab 01/09/14 1840 01/10/14 0153  WBC 30.4* 26.3*  NEUTROABS 27.1* 22.1*  HGB 13.1 13.1  HCT 37.9* 38.4*  MCV 93.6 94.8  PLT 415* 390   Cardiac Enzymes: No results found for this basename: CKTOTAL, CKMB, CKMBINDEX, TROPONINI,  in the last 168 hours BNP (last 3 results) No results found for this basename: PROBNP,  in the last 8760 hours CBG: No results found for this basename: GLUCAP,  in the last 168 hours  Recent Results (from the past 240 hour(s))  MRSA PCR SCREENING     Status: None   Collection Time    01/10/14 12:47 AM      Result Value Ref Range Status   MRSA by PCR NEGATIVE  NEGATIVE Final   Comment:            The GeneXpert MRSA Assay (FDA     approved for NASAL specimens     only), is one component of a     comprehensive MRSA colonization     surveillance program. It is not     intended to diagnose MRSA     infection nor to guide or     monitor treatment for     MRSA infections.     Studies: Dg Chest 2 View  01/09/2014   CLINICAL DATA:  Weakness on the left side.  EXAM: CHEST  2 VIEW  COMPARISON:  Chest x-ray  01/09/2014.  FINDINGS: Lung volumes are slightly low. Mild diffuse peribronchial cuffing. No definite acute consolidative airspace disease. No definite pleural effusions. No evidence of pulmonary edema. Heart size is mildly enlarged. Upper mediastinal contours are within normal limits. Atherosclerosis in the thoracic aorta.  IMPRESSION: 1. Mild diffuse peribronchial cuffing. This could suggest a mild bronchitis. 2. Mild cardiomegaly. 3. Atherosclerosis.   Electronically Signed   By: Trudie Reed M.D.   On: 01/09/2014 23:25   Dg Chest 2 View  01/09/2014   CLINICAL DATA:  Altered mental status.  Fever.  EXAM: CHEST  2 VIEW  COMPARISON:  CT CHEST W/O CM dated 12/28/2013  FINDINGS: The cardiac silhouette is enlarged. The lungs are mildly hypoinflated with mild bibasilar opacities. No pleural effusion or pneumothorax is identified. Minimally displaced left-sided rib fractures are better demonstrated on recent CT.  IMPRESSION: Hypoinflation with mild bibasilar opacity, likely atelectasis. No pneumothorax identified.   Electronically Signed   By: Sebastian Ache   On: 01/09/2014 19:38   Ct Head Wo Contrast  01/09/2014   CLINICAL DATA:  Altered mental status and fever.  EXAM: CT HEAD WITHOUT CONTRAST  TECHNIQUE: Contiguous axial images were obtained from the base of the skull through the vertex without intravenous contrast.  COMPARISON:  12/28/2013  FINDINGS: Hypoattenuation involving the right basal ganglia is stable to slightly decreased compared to the prior study. Periventricular white matter hypodensities do not appear significantly changed and are compatible with moderate chronic small vessel ischemic disease. There is no evidence of acute intracranial hemorrhage. Cerebral atrophy is unchanged. There is no midline shift or extra-axial fluid collection. Orbits are unremarkable. Mastoid air cells are clear. Small amount of frothy secretion is present in the left sphenoid sinus.  IMPRESSION: 1. No evidence of acute  intracranial hemorrhage or definite new infarct. 2. Stable to slightly decreased prominence right basal ganglia hypoattenuation, which may reflect evolving infarct.   Electronically Signed   By: Sebastian Ache   On: 01/09/2014 20:41   Dg Knee Left Port  01/10/2014   CLINICAL DATA:  Fall  EXAM: PORTABLE LEFT KNEE - 1-2 VIEW  COMPARISON:  None available  FINDINGS: Study is somewhat limited due to patient positioning. Severe tricompartmental degenerative osteoarthrosis is seen as evidenced by joint space narrowing, juxta-articular osteophytosis, and subchondral sclerosis. Findings are most severe within the patellofemoral joint space compartment. A small joint effusion present within the suprapatellar recess. Chondrocalcinosis noted. No definite acute fracture or dislocation.  No soft tissue abnormality identified.  IMPRESSION: 1. Somewhat limited study due to patient positioning.  No definite acute fracture or dislocation. 2. Severe tricompartmental degenerative osteoarthrosis. 3. Small joint effusion.   Electronically Signed   By: Rise MuBenjamin  McClintock M.D.   On: 01/10/2014 00:27    Scheduled Meds: . apixaban  5 mg Oral BID  . atorvastatin  40 mg Oral Daily  . brinzolamide  1 drop Both Eyes BID  . carvedilol  12.5 mg Oral BID WC  . piperacillin-tazobactam (ZOSYN)  IV  3.375 g Intravenous Q8H  . polyethylene glycol  17 g Oral Daily  . sennosides-docusate sodium  2 tablet Oral BID  . sodium chloride  3 mL Intravenous Q12H  . traMADol  25 mg Oral 3 times per day  . Travoprost (BAK Free)  1 drop Both Eyes QHS  . vancomycin  750 mg Intravenous Q12H   Continuous Infusions: . sodium chloride 75 mL/hr at 01/10/14 0115    Principal Problem:   Respiratory failure with hypoxia Active Problems:   Acute encephalopathy   Atrial fibrillation   HTN (hypertension)   Dyslipidemia   Leukocytosis   PNA (pneumonia)    Time spent: >35 minutes     Esperanza SheetsBURIEV, Hodan Wurtz N  [facility] Pager [phone]. If  7PM-7AM, please contact night-coverage at [email or web address], password [email or web address] 01/10/2014, 10:07 AM  LOS: 1 day

## 2014-01-10 NOTE — Evaluation (Signed)
Clinical/Bedside Swallow Evaluation Patient Details  Name: Joel [name] MRN: [id number] Date of Birth: 06/18/1928  Today's Date: 01/10/2014 Time: 1010-1100 SLP Time Calculation (min): 50 min  Past Medical History:  Past Medical History  Diagnosis Date  . Stroke   . A-fib   . Diabetes mellitus without complication   . Hypertension   . Rib fracture    Past Surgical History: No past surgical history on file. HPI:  80 year old male admitted 01/09/14 due to AMS.  PMH significant for R MCA CVA.   Assessment / Plan / Recommendation Clinical Impression  Pt completed oral care after setup.  Oral motor strength and function appear adequate.  No overt s/s aspiration observed during po trials.  Will begin modified diet due to LUE immobility (dys 3 with chopped meats and thin liquids). SLP will follow for diet tolerance.    Aspiration Risk  Mild    Diet Recommendation Dysphagia 3 (Mechanical Soft);Thin liquid   Liquid Administration via: Cup;Straw Medication Administration: Whole meds with liquid Supervision: Patient able to self feed Compensations: Slow rate;Small sips/bites Postural Changes and/or Swallow Maneuvers: Seated upright 90 degrees;Upright 30-60 min after meal    Other  Recommendations Oral Care Recommendations: Oral care BID   Follow Up Recommendations  24 hour supervision/assistance    Frequency and Duration min 1 x/week  1 week   Pertinent Vitals/Pain VSS; pt reports pain in left leg    SLP Swallow Goals   see care plan   Swallow Study Prior Functional Status   history of CVA. Per pt, [facility], and pt placed on thickened liquids.    General Date of Onset: 01/09/14 HPI: 80 year old male admitted 01/09/14 due to AMS.  PMH significant for R MCA CVA. Type of Study: Bedside swallow evaluation Previous Swallow Assessment: none at this facility. Pt [facility]. Diet Prior to this Study: NPO Temperature Spikes Noted: No Respiratory  Status: Nasal cannula History of Recent Intubation: No Behavior/Cognition: Alert;Cooperative;Pleasant mood;Confused Oral Cavity - Dentition: Dentures, bottom;Dentures, top Self-Feeding Abilities: Needs assist Patient Positioning: Upright in bed Baseline Vocal Quality: Clear Volitional Cough: Strong Volitional Swallow: Able to elicit    Oral/Motor/Sensory Function Overall Oral Motor/Sensory Function: Appears within functional limits for tasks assessed   Ice Chips Ice chips: Not tested   Thin Liquid Thin Liquid: Within functional limits Presentation: Straw;Cup    Nectar Thick Nectar Thick Liquid: Not tested   Honey Thick Honey Thick Liquid: Not tested   Puree Puree: Within functional limits Presentation: Spoon   Solid   GO   Vangie Henthorn B. Murvin NatalBueche, [facility] Physicians PaMSP, CCC-SLP [phone] Solid: Within functional limits Presentation: Self Fed       Murvin NatalBueche, Ruffin Pyoelia Brown 01/10/2014,11:59 AM

## 2014-01-11 LAB — CBC
HEMATOCRIT: 38.6 % — AB (ref 39.0–52.0)
HEMOGLOBIN: 13.1 g/dL (ref 13.0–17.0)
MCH: 32 pg (ref 26.0–34.0)
MCHC: 33.9 g/dL (ref 30.0–36.0)
MCV: 94.1 fL (ref 78.0–100.0)
Platelets: 417 10*3/uL — ABNORMAL HIGH (ref 150–400)
RBC: 4.1 MIL/uL — AB (ref 4.22–5.81)
RDW: 13.1 % (ref 11.5–15.5)
WBC: 12.8 10*3/uL — ABNORMAL HIGH (ref 4.0–10.5)

## 2014-01-11 LAB — BASIC METABOLIC PANEL
BUN: 24 mg/dL — AB (ref 6–23)
CALCIUM: 9.2 mg/dL (ref 8.4–10.5)
CO2: 18 meq/L — AB (ref 19–32)
CREATININE: 1.12 mg/dL (ref 0.50–1.35)
Chloride: 99 mEq/L (ref 96–112)
GFR calc Af Amer: 68 mL/min — ABNORMAL LOW (ref >90)
GFR, EST NON AFRICAN AMERICAN: 59 mL/min — AB (ref >90)
GLUCOSE: 117 mg/dL — AB (ref 70–99)
Potassium: 4.7 mEq/L (ref 3.7–5.3)
Sodium: 133 mEq/L — ABNORMAL LOW (ref 137–147)

## 2014-01-11 LAB — GLUCOSE, CAPILLARY: Glucose-Capillary: 107 mg/dL — ABNORMAL HIGH (ref 70–99)

## 2014-01-11 NOTE — Evaluation (Signed)
Physical Therapy Evaluation Patient Details Name: Joel [name] MRN: [id number] DOB: 04/12/1928 Today's Date: 01/11/2014   History of Present Illness  80 year old male with past medical history of recent CVA (right MCA stroke), [facility], was found to be in atrial fibrillation and is now on Eliquis for anticoagulation who [facility] with complaints of increased lethargy and confusion as noted by his family for past 24 hours. Patient is from nursing home and per SNF report patient had a fever there of 101.8 F and his oxygen saturation was 81%. There was no respiratory distress. No complaints of chest pain, palpitations of shortness of breath. No cough. No abdominal pain, no nausea or vomiting. No diarrhea and no blood in stool or urine.    Clinical Impression  Pt adm with the above dx. Pt's functional mobility is limited to pain, generalized weakness and L hemiplegia. Pt req 2 person max A for OOB transfers. Pt expects to return to SNF upon d/c. Pt to benefit from skilled acute PT to address limitations listed below.     Follow Up Recommendations SNF    Equipment Recommendations       Recommendations for Other Services       Precautions / Restrictions Precautions Precautions: Fall Restrictions Weight Bearing Restrictions: No      Mobility  Bed Mobility Overal bed mobility: Needs Assistance Bed Mobility: Supine to Sit     Supine to sit: Max assist     General bed mobility comments: pt able to hold and assist pulling up with R UE. verbal cues for hand placement. Pt req max A trunk support and max A LE swing EOB   Transfers Overall transfer level: Needs assistance Equipment used:  (2 person gait belt and bed pad) Transfers: Sit to/from BJ'sStand;Stand Pivot Transfers Sit to Stand: +2 physical assistance;Max assist Stand pivot transfers: +2 physical assistance;Max assist       General transfer comment: pt unable to acheive full upright standing  position with 2 person max A. pt req blocking on BIlat LE and knees with gait belt and bed pad transfer. verbal cues for safety and hand placement  Ambulation/Gait                Stairs            Wheelchair Mobility    Modified Rankin (Stroke Patients Only)       Balance Overall balance assessment: Needs assistance Sitting-balance support: Single extremity supported;Feet supported Sitting balance-Leahy Scale: Poor Sitting balance - Comments: pt req initial max A for trunk and verbal cues for hand placement and to adjust to midline. pt able to maintain sitting balance with min guard for approximately 30 sec  Postural control: Posterior lean Standing balance support:  (unable to acheive standing position)                         Pertinent Vitals/Pain Pt reports mild chest pain when attempting to shrug shoulders. Pt reports pain with all functional mobility activities.     Home Living Family/patient expects to be discharged to:: Skilled nursing facility                 Additional Comments: [facility]     Prior Function Level of Independence: Needs assistance   Gait / Transfers Assistance Needed: began working on ambulation at rehab otherwise used w/c  ADL's / Homemaking Assistance Needed: assist  for rehab staff for dressing/bathing. pt able to feed self with R hand        Hand Dominance   Dominant Hand: Right    Extremity/Trunk Assessment   Upper Extremity Assessment: Generalized weakness;LUE deficits/detail       LUE Deficits / Details: flaccid    Lower Extremity Assessment: Generalized weakness;LLE deficits/detail   LLE Deficits / Details: minimal ADD and ER. unable to produce any other movements     Communication   Communication: No difficulties  Cognition Arousal/Alertness: Awake/alert Behavior During Therapy: WFL for tasks assessed/performed Overall Cognitive Status: Within Functional Limits for  tasks assessed                      General Comments General comments (skin integrity, edema, etc.): highly sensitive L heel to touch.     Exercises General Exercises - Lower Extremity Ankle Circles/Pumps: Strengthening;Right;5 reps;Supine Heel Slides: AAROM;5 reps;Seated;Strengthening      Assessment/Plan    PT Assessment Patient needs continued PT services  PT Diagnosis Generalized weakness;Hemiplegia non-dominant side   PT Problem List Decreased strength;Decreased range of motion;Decreased activity tolerance;Decreased balance;Decreased mobility;Decreased coordination;Decreased knowledge of use of DME;Decreased safety awareness;Pain  PT Treatment Interventions Gait training;Stair training;DME instruction;Functional mobility training;Therapeutic activities;Therapeutic exercise;Balance training;Neuromuscular re-education;Patient/family education   PT Goals (Current goals can be found in the Care Plan section) Acute Rehab PT Goals Patient Stated Goal: none stated PT Goal Formulation: With patient Time For Goal Achievement: 01/18/14 Potential to Achieve Goals: Good    Frequency Min 3X/week   Barriers to discharge        End of Session Equipment Utilized During Treatment: Gait belt;Oxygen (3L O2 on wall) Activity Tolerance: Patient limited by fatigue Patient left: in chair;with call bell/phone within reach         Time: 1000-1047 PT Time Calculation (min): 47 min   Charges:   PT Evaluation $Initial PT Evaluation Tier I: 1 Procedure PT Treatments $Therapeutic Activity: 8-22 mins   PT G Codes:          Marybell Robards SPT 01/11/2014, 11:15 AM

## 2014-01-11 NOTE — Evaluation (Addendum)
Physical Therapy Evaluation Patient Details Name: Joel [name] MRN: [id number] DOB: 05/03/1928 Today's Date: 01/11/2014   History of Present Illness  80 year old male with past medical history of recent CVA (right MCA stroke), hospitalized at [facility], was found to be in atrial fibrillation and is now on Eliquis for anticoagulation who presented to [facility] ED 01/09/2014 with complaints of increased lethargy and confusion as noted by his family for past 24 hours. Patient is from nursing home and per SNF report patient had a fever there of 101.8 F and his oxygen saturation was 81%. There was no respiratory distress. No complaints of chest pain, palpitations of shortness of breath. No cough. No abdominal pain, no nausea or vomiting. No diarrhea and no blood in stool or urine.    Clinical Impression  Pt adm with the above dx. Pt's functional mobility is limited to pain, generalized weakness and L hemiplegia. Pt req 2 person max A for OOB transfers. Pt con't to be appropriate for return to SNF upon d/c, however daughter request different facility than [facility]. Pt to benefit from skilled acute PT to address limitations listed below.     Follow Up Recommendations SNF    Equipment Recommendations  None recommended by PT    Recommendations for Other Services       Precautions / Restrictions Precautions Precautions: Fall Restrictions Weight Bearing Restrictions: No      Mobility  Bed Mobility Overal bed mobility: Needs Assistance Bed Mobility: Supine to Sit     Supine to sit: Max assist     General bed mobility comments: pt able to hold and assist pulling up with R UE. verbal cues for hand placement. Pt req max A trunk support and max A LE swing EOB   Transfers Overall transfer level: Needs assistance Equipment used:  (2 person gait belt and bed pad) Transfers: Sit to/from [employer] Transfers Sit to Stand: +2 physical assistance;Max assist Stand pivot transfers: +2  physical assistance;Max assist       General transfer comment: pt unable to acheive full upright standing position with 2 person max A. pt req blocking on BIlat LE and knees with gait belt and bed pad transfer. verbal cues for safety and hand placement  Ambulation/Gait                Stairs            Wheelchair Mobility    Modified Rankin (Stroke Patients Only) Modified Rankin (Stroke Patients Only) Pre-Morbid Rankin Score: Severe disability Modified Rankin: Severe disability     Balance Overall balance assessment: Needs assistance Sitting-balance support: Single extremity supported;Feet supported Sitting balance-Leahy Scale: Poor Sitting balance - Comments: pt req initial max A for trunk and verbal cues for hand placement and to adjust to midline. pt able to maintain sitting balance with min guard for approximately 30 sec  Postural control: Posterior lean Standing balance support:  (unable to acheive standing position)                         Pertinent Vitals/Pain Pt reports mild chest pain when attempting to shrug shoulders. Pt reports pain with all functional mobility activities.     Home Living Family/patient expects to be discharged to:: Skilled nursing facility                 Additional Comments: pt was at [facility].     Prior Function Level of  Independence: Needs assistance   Gait / Transfers Assistance Needed: began working on ambulation at rehab otherwise used w/c  ADL's / Homemaking Assistance Needed: assist for rehab staff for dressing/bathing. pt able to feed self with R hand        Hand Dominance   Dominant Hand: Right    Extremity/Trunk Assessment   Upper Extremity Assessment: Generalized weakness;LUE deficits/detail       LUE Deficits / Details: flaccid    Lower Extremity Assessment: Generalized weakness;LLE deficits/detail   LLE Deficits / Details: minimal ADD and ER. unable to produce any other  movements  Cervical / Trunk Assessment: Kyphotic  Communication   Communication: No difficulties  Cognition Arousal/Alertness: Awake/alert Behavior During Therapy: WFL for tasks assessed/performed Overall Cognitive Status: Within Functional Limits for tasks assessed                      General Comments General comments (skin integrity, edema, etc.): highly sensitive L heel to touch.     Exercises General Exercises - Lower Extremity Ankle Circles/Pumps: Strengthening;Right;5 reps;Supine Heel Slides: AAROM;5 reps;Seated;Strengthening      Assessment/Plan    PT Assessment Patient needs continued PT services  PT Diagnosis Generalized weakness;Hemiplegia non-dominant side   PT Problem List Decreased strength;Decreased range of motion;Decreased activity tolerance;Decreased balance;Decreased mobility;Decreased coordination;Decreased knowledge of use of DME;Decreased safety awareness;Pain  PT Treatment Interventions Gait training;Stair training;DME instruction;Functional mobility training;Therapeutic activities;Therapeutic exercise;Balance training;Neuromuscular re-education;Patient/family education   PT Goals (Current goals can be found in the Care Plan section) Acute Rehab PT Goals Patient Stated Goal: none stated PT Goal Formulation: With patient Time For Goal Achievement: 01/18/14 Potential to Achieve Goals: Good    Frequency Min 3X/week   Barriers to discharge        End of Session Equipment Utilized During Treatment: Gait belt;Oxygen (3L O2 on wall) Activity Tolerance: Patient limited by fatigue Patient left: in chair;with call bell/phone within reach         Time: 1000-1047 PT Time Calculation (min): 47 min   Charges:   PT Evaluation $Initial PT Evaluation Tier I: 1 Procedure PT Treatments $Therapeutic Exercise: 8-22 mins $Therapeutic Activity: 8-22 mins   PT G Codes:          Malachi ParadiseKohler, Andrew SPT 01/11/2014, 1:13 PM  Agree with above  assessment.  Lewis ShockAshly Starr Urias, PT, DPT Pager #: [phone] Office #: [phone]

## 2014-01-11 NOTE — Progress Notes (Signed)
OT Cancellation Note  Patient Details Name: Joel [name] Eddie MRN: [id number] DOB: 05/22/1928   Cancelled Treatment:    Reason Eval/Treat Not Completed: Other (comment).Pt is from SNF and current D/C plan is back to  SNF. No apparent immediate acute care OT needs, therefore will defer OT to SNF. If OT eval is needed please call Acute Rehab Dept. at [phone] or text page OT at [phone].    Evette GeorgesLeonard, [name] [name] [phone] 01/11/2014, 12:32 PM

## 2014-01-11 NOTE — Progress Notes (Signed)
[facility] PROGRESS NOTE  Joel [name] ZOX:[id number] DOB: 06/05/1928 DOA: 01/09/2014 PCP: No primary provider on file.  Brief narrative: 80 year old male with past medical history of recent CVA (right MCA stroke), from SNF, [facility], was found to be in atrial fibrillation and is now on Eliquis for anticoagulation who presented to [facility] ED 01/09/2014 with complaints of increased lethargy and confusion for past 1 day prior to this admission. In addition, per SNF report patient had a fever there of 101.8 F and his oxygen saturation was 81%. There was no respiratory distress.  In ED, BP was 107/55, HR 80, Tmax 100.2F and oxygen saturation 91% on 2 L [place] oxygen support. CXR shoed possible basilar infiltrates. CT head did not reveal acute intracranial findings but it did show possible evolving infarct in right basal ganglia. Blood work revealed leukocytosis of 30 otherwise unremarkable. Urinalysis did not show evidence of an infection. Patient was started on broad spectrum antibiotics for possible pneumonia.   Assessment and Plan:   Principal Problem:  Acute respiratory failure with hypoxia  - likely secondary to pneumonia (HCAP)  - patient was started on broad spectrum antibiotics, vanco and zosyn  - resp status remains stable at this time  - continue oxygen support via nasal canula to keep O2 saturation above 90%  - bronchodilators as needed Active Problems:  Acute encephalopathy  - possible related to pneumonia but evolving infarct also possible; per MRI brain possible late acute to subacute right MCA territory insult, possible embolic phenomenon, no hemorrhagic transformation.  - PT/OT - will return to SNF when stable for discharge - dysphagia 3 diet per SLP Right MCA CVA - evolving infarct seen on CT head; per MRI brain possible late acute to subacute right MCA territory insult, possible embolic phenomenon, no hemorrhagic transformation - follow up carotid doppler and 2  D ECHO HCAP - pneumonia order set in place  - continue vanco and zosyn  - blood culture, legionella, influenza, strep pneumoniae - all negative - oxygen support via nasal canula to keep O2 saturation above 90%  Atrial fibrillation  - continue coreg and Eliquis  Left knee pain  - x ray small effusion but no fracture; pain better with pain meds HTN (hypertension)  - may continue coreg; if P rises will use norvasc per home dose Dyslipidemia  - continue statin therapy  Leukocytosis  - likely due to pneumonia  - management as above   Code Status: Full  Family Communication: Pt at bedside  Disposition Plan: remains inpatient    Manson PasseyEVINE, Vanice Rappa, MD  [facility] Pager [phone]  If 7PM-7AM, please contact night-coverage [email or web address] Password [email or web address] 01/11/2014, 1:39 PM   LOS: 2 days   Consultants:  None   Procedures:  None   Antibiotics:  vanco 3/26 -->  Zosyn 3/26 -->  HPI/Subjective: No overnight events.   Objective: Filed Vitals:   01/11/14 0400 01/11/14 0738 01/11/14 1000 01/11/14 1143  BP: 153/75 135/73 118/74 130/76  Pulse: 69 70 68 71  Temp: 98.5 F (36.9 C) 98.4 F (36.9 C) 98.6 F (37 C) 97.9 F (36.6 C)  TempSrc: Oral Oral Oral Oral  Resp: 18 16 22 18   Height:      Weight: 86.682 kg (191 lb 1.6 oz)     SpO2: 99% 99% 96% 100%    Intake/Output Summary (Last 24 hours) at 01/11/14 1339 Last data filed at 01/11/14 0800  Gross per 24 hour  Intake    240 ml  Output   1750 ml  Net  -1510 ml    Exam:   General:  Pt is alert, confused, no acute distress  Cardiovascular: Regular rate and rhythm, S1/S2 appreciated  Respiratory: Clear to auscultation bilaterally, no wheezing  Abdomen: Soft, non tender, non distended, bowel sounds present  Extremities: Pulses DP and PT palpable bilaterally  Neuro: Grossly nonfocal  Data Reviewed: Basic Metabolic Panel:  Recent Labs Lab 01/09/14 1840 01/10/14 0153 01/11/14 0535  NA 137 138 133*  K  5.3 4.9 4.7  CL 100 101 99  CO2 22 23 18*  GLUCOSE 149* 126* 117*  BUN 33* 30* 24*  CREATININE 1.23 1.16 1.12  CALCIUM 9.3 9.0 9.2  MG  --  2.3  --   PHOS  --  3.9  --    Liver Function Tests:  Recent Labs Lab 01/09/14 1840 01/10/14 0153  AST 90* 63*  ALT 80* 67*  ALKPHOS 173* 156*  BILITOT 0.3 0.4  PROT 7.1 6.9  ALBUMIN 2.5* 2.5*   No results found for this basename: LIPASE, AMYLASE,  in the last 168 hours No results found for this basename: AMMONIA,  in the last 168 hours CBC:  Recent Labs Lab 01/09/14 1840 01/10/14 0153 01/11/14 0535  WBC 30.4* 26.3* 12.8*  NEUTROABS 27.1* 22.1*  --   HGB 13.1 13.1 13.1  HCT 37.9* 38.4* 38.6*  MCV 93.6 94.8 94.1  PLT 415* 390 417*   Cardiac Enzymes: No results found for this basename: CKTOTAL, CKMB, CKMBINDEX, TROPONINI,  in the last 168 hours BNP: No components found with this basename: POCBNP,  CBG:  Recent Labs Lab 01/11/14 0737  GLUCAP 107*    Recent Results (from the past 240 hour(s))  CULTURE, BLOOD (ROUTINE X 2)     Status: None   Collection Time    01/09/14  7:40 PM      Result Value Ref Range Status   Specimen Description BLOOD HAND RIGHT   Final   Special Requests BOTTLES DRAWN AEROBIC AND ANAEROBIC 5CC   Final   Culture  Setup Time     Final   Value: 01/10/2014 00:54     Performed at [facility]   Culture     Final   Value:        BLOOD CULTURE RECEIVED NO GROWTH TO DATE CULTURE WILL BE HELD FOR 5 DAYS BEFORE ISSUING A FINAL NEGATIVE REPORT     Performed at [facility]   Report Status PENDING   Incomplete  CULTURE, BLOOD (ROUTINE X 2)     Status: None   Collection Time    01/09/14  8:15 PM      Result Value Ref Range Status   Specimen Description BLOOD ARM LEFT   Final   Special Requests BOTTLES DRAWN AEROBIC AND ANAEROBIC 10CC   Final   Culture  Setup Time     Final   Value: 01/10/2014 00:55     Performed at [facility]   Culture     Final   Value:        BLOOD  CULTURE RECEIVED NO GROWTH TO DATE CULTURE WILL BE HELD FOR 5 DAYS BEFORE ISSUING A FINAL NEGATIVE REPORT     Performed at [facility]   Report Status PENDING   Incomplete  MRSA PCR SCREENING     Status: None   Collection Time    01/10/14 12:47 AM      Result Value Ref Range Status  MRSA by PCR NEGATIVE  NEGATIVE Final   Comment:            The GeneXpert MRSA Assay (FDA     approved for NASAL specimens     only), is one component of a     comprehensive MRSA colonization     surveillance program. It is not     intended to diagnose MRSA     infection nor to guide or     monitor treatment for     MRSA infections.     Studies: Dg Chest 2 View  01/09/2014   CLINICAL DATA:  Weakness on the left side.  EXAM: CHEST  2 VIEW  COMPARISON:  Chest x-ray 01/09/2014.  FINDINGS: Lung volumes are slightly low. Mild diffuse peribronchial cuffing. No definite acute consolidative airspace disease. No definite pleural effusions. No evidence of pulmonary edema. Heart size is mildly enlarged. Upper mediastinal contours are within normal limits. Atherosclerosis in the thoracic aorta.  IMPRESSION: 1. Mild diffuse peribronchial cuffing. This could suggest a mild bronchitis. 2. Mild cardiomegaly. 3. Atherosclerosis.   Electronically Signed   By: Trudie Reed M.D.   On: 01/09/2014 23:25   Dg Chest 2 View  01/09/2014   CLINICAL DATA:  Altered mental status.  Fever.  EXAM: CHEST  2 VIEW  COMPARISON:  CT CHEST W/O CM dated 12/28/2013  FINDINGS: The cardiac silhouette is enlarged. The lungs are mildly hypoinflated with mild bibasilar opacities. No pleural effusion or pneumothorax is identified. Minimally displaced left-sided rib fractures are better demonstrated on recent CT.  IMPRESSION: Hypoinflation with mild bibasilar opacity, likely atelectasis. No pneumothorax identified.   Electronically Signed   By: Sebastian Ache   On: 01/09/2014 19:38   Ct Head Wo Contrast  01/09/2014   CLINICAL DATA:  Altered  mental status and fever.  EXAM: CT HEAD WITHOUT CONTRAST  TECHNIQUE: Contiguous axial images were obtained from the base of the skull through the vertex without intravenous contrast.  COMPARISON:  12/28/2013  FINDINGS: Hypoattenuation involving the right basal ganglia is stable to slightly decreased compared to the prior study. Periventricular white matter hypodensities do not appear significantly changed and are compatible with moderate chronic small vessel ischemic disease. There is no evidence of acute intracranial hemorrhage. Cerebral atrophy is unchanged. There is no midline shift or extra-axial fluid collection. Orbits are unremarkable. Mastoid air cells are clear. Small amount of frothy secretion is present in the left sphenoid sinus.  IMPRESSION: 1. No evidence of acute intracranial hemorrhage or definite new infarct. 2. Stable to slightly decreased prominence right basal ganglia hypoattenuation, which may reflect evolving infarct.   Electronically Signed   By: Sebastian Ache   On: 01/09/2014 20:41   Mr Brain Wo Contrast  01/10/2014   CLINICAL DATA:  Altered mental status. History of right MCA stroke. Sepsis. Atrial fibrillation  EXAM: MRI HEAD WITHOUT CONTRAST  MRA HEAD WITHOUT CONTRAST  TECHNIQUE: Multiplanar, multiecho pulse sequences of the brain and surrounding structures were obtained without intravenous contrast. Angiographic images of the head were obtained using MRA technique without contrast.  COMPARISON:  CT head 01/09/2014  FINDINGS: MRI HEAD FINDINGS  Restricted diffusion within the right posterior frontal cortex, right basal ganglia, and periventricular white matter consistent with the history of recent stroke. No blood products to suggest hemorrhagic conversion. No left-sided ischemic change.  Moderate cerebral and cerebellar atrophy. Advanced subcortical and periventricular white matter signal abnormality, also affecting the brainstem, most consistent with chronic microvascular ischemic  change. Flow voids  are maintained in the carotid and basilar arteries. Right vertebral is dominant with diminished flow related enhancement left vertebral.  Mild pannus. Normal pituitary and cerebellar tonsils. No osseous lesions. No acute sinus or mastoid disease. Negative appearing orbits. Similar appearance to most recent CT.  MRA HEAD FINDINGS  Internal carotid arteries are widely patent. Basilar artery is widely patent with left vertebral dominant. Right vertebral demonstrates diminished flow related enhancement, likely distal stenosis of a flow-limiting nature.  There are tandem stenoses involving the M1 segment of the right middle cerebral artery potentially flow reducing. Diminished number of MCA trifurcation vessels on the right.  The left M1 middle cerebral artery demonstrates severe to absent flow related enhancement. No appreciable trifurcation branch visualization  Severe stenosis P2 segment right posterior cerebral artery. Left PCA widely patent.  Mild irregularity both proximal anterior cerebral arteries without distal disease. Cerebellar branches poorly visualized with gross patency of the superior cerebellar arteries. No visible intracranial aneurysm.  IMPRESSION: Restricted diffusion within the right posterior frontal cortex, right basal ganglia, and periventricular white matter consistent with the history of recent stroke. The distribution of ischemia is consistent with a late acute to subacute right MCA territory insult.  No hemorrhagic transformation, and no multifocal areas of acute infarction which might suggest an embolic phenomenon.  Severe intracranial atherosclerotic disease, most notable in the left greater than right middle cerebral arteries, left distal vertebral artery, and right P2 posterior cerebral artery. See discussion above.   Electronically Signed   By: Davonna Belling M.D.   On: 01/10/2014 15:57   Dg Knee Left Port  01/10/2014   CLINICAL DATA:  Fall  EXAM: PORTABLE LEFT KNEE -  1-2 VIEW  COMPARISON:  None available  FINDINGS: Study is somewhat limited due to patient positioning. Severe tricompartmental degenerative osteoarthrosis is seen as evidenced by joint space narrowing, juxta-articular osteophytosis, and subchondral sclerosis. Findings are most severe within the patellofemoral joint space compartment. A small joint effusion present within the suprapatellar recess. Chondrocalcinosis noted. No definite acute fracture or dislocation.  No soft tissue abnormality identified.  IMPRESSION: 1. Somewhat limited study due to patient positioning. No definite acute fracture or dislocation. 2. Severe tricompartmental degenerative osteoarthrosis. 3. Small joint effusion.   Electronically Signed   By: Rise Mu M.D.   On: 01/10/2014 00:27   Mr Maxine Glenn Head/brain Wo Cm  01/10/2014   CLINICAL DATA:  Altered mental status. History of right MCA stroke. Sepsis. Atrial fibrillation  EXAM: MRI HEAD WITHOUT CONTRAST  MRA HEAD WITHOUT CONTRAST  TECHNIQUE: Multiplanar, multiecho pulse sequences of the brain and surrounding structures were obtained without intravenous contrast. Angiographic images of the head were obtained using MRA technique without contrast.  COMPARISON:  CT head 01/09/2014  FINDINGS: MRI HEAD FINDINGS  Restricted diffusion within the right posterior frontal cortex, right basal ganglia, and periventricular white matter consistent with the history of recent stroke. No blood products to suggest hemorrhagic conversion. No left-sided ischemic change.  Moderate cerebral and cerebellar atrophy. Advanced subcortical and periventricular white matter signal abnormality, also affecting the brainstem, most consistent with chronic microvascular ischemic change. Flow voids are maintained in the carotid and basilar arteries. Right vertebral is dominant with diminished flow related enhancement left vertebral.  Mild pannus. Normal pituitary and cerebellar tonsils. No osseous lesions. No acute  sinus or mastoid disease. Negative appearing orbits. Similar appearance to most recent CT.  MRA HEAD FINDINGS  Internal carotid arteries are widely patent. Basilar artery is widely patent with left vertebral dominant. Right  vertebral demonstrates diminished flow related enhancement, likely distal stenosis of a flow-limiting nature.  There are tandem stenoses involving the M1 segment of the right middle cerebral artery potentially flow reducing. Diminished number of MCA trifurcation vessels on the right.  The left M1 middle cerebral artery demonstrates severe to absent flow related enhancement. No appreciable trifurcation branch visualization  Severe stenosis P2 segment right posterior cerebral artery. Left PCA widely patent.  Mild irregularity both proximal anterior cerebral arteries without distal disease. Cerebellar branches poorly visualized with gross patency of the superior cerebellar arteries. No visible intracranial aneurysm.  IMPRESSION: Restricted diffusion within the right posterior frontal cortex, right basal ganglia, and periventricular white matter consistent with the history of recent stroke. The distribution of ischemia is consistent with a late acute to subacute right MCA territory insult.  No hemorrhagic transformation, and no multifocal areas of acute infarction which might suggest an embolic phenomenon.  Severe intracranial atherosclerotic disease, most notable in the left greater than right middle cerebral arteries, left distal vertebral artery, and right P2 posterior cerebral artery. See discussion above.   Electronically Signed   By: Davonna Belling M.D.   On: 01/10/2014 15:57    Scheduled Meds: . apixaban  5 mg Oral BID  . atorvastatin  40 mg Oral Daily  . brinzolamide  1 drop Both Eyes BID  . carvedilol  12.5 mg Oral BID WC  . piperacillin-tazobactam (ZOSYN)  IV  3.375 g Intravenous Q8H  . polyethylene glycol  17 g Oral Daily  . sennosides-docusate sodium  2 tablet Oral BID  . sodium  chloride  3 mL Intravenous Q12H  . traMADol  25 mg Oral 3 times per day  . Travoprost (BAK Free)  1 drop Both Eyes QHS  . vancomycin  750 mg Intravenous Q12H   Continuous Infusions:

## 2014-01-12 DIAGNOSIS — I6350 Cerebral infarction due to unspecified occlusion or stenosis of unspecified cerebral artery: Secondary | ICD-10-CM

## 2014-01-12 DIAGNOSIS — I369 Nonrheumatic tricuspid valve disorder, unspecified: Secondary | ICD-10-CM

## 2014-01-12 LAB — URINE CULTURE
COLONY COUNT: NO GROWTH
Culture: NO GROWTH

## 2014-01-12 NOTE — Progress Notes (Signed)
[facility] PROGRESS NOTE  Tal Kempker ZOX:[id number] DOB: 1928-07-10 DOA: 01/09/2014 PCP: No primary provider on file.  Brief narrative: 80 year old male with past medical history of recent CVA (right MCA stroke), from SNF, hospitalized at [facility], was found to be in atrial fibrillation and is now on Eliquis for anticoagulation who presented to [facility] ED 01/09/2014 with ncreased lethargy and confusion for past  24 hours prior to this admission. In addition, per SNF report patient had a fever there of 101.8 F and his oxygen saturation was 81%. There was no respiratory distress.  In ED, BP was 107/55, HR 80, Tmax 100.52F and oxygen saturation 91% on 2 L [place] oxygen support. CXR shoed possible basilar infiltrates. CT head did not reveal acute intracranial findings but it did show possible evolving infarct in right basal ganglia. Blood work revealed leukocytosis of 30 but otherwise unremarkable. Urinalysis did not show evidence of an infection. Patient was started on broad spectrum antibiotics for possible pneumonia.   Assessment and Plan:   Principal Problem:  Acute respiratory failure with hypoxia  - likely secondary to pneumonia (HCAP); respiratory status is stable - patient was started on broad spectrum antibiotics, vanco and zosyn  - continue oxygen support via nasal canula to keep O2 saturation above 90%  - bronchodilators as needed  Active Problems:  Acute encephalopathy  - possible related to pneumonia but evolving infarct also possible; per MRI brain possible late acute to subacute right MCA territory insult, possible embolic phenomenon, no hemorrhagic transformation.  - PT/OT - will return to SNF when stable for discharge  likely in next 24-48 hours - dysphagia 3 diet per SLP  Right MCA CVA - evolving infarct seen on CT head; per MRI brain possible late acute to subacute right MCA territory insult, possible embolic phenomenon, no hemorrhagic transformation  - follow up carotid  doppler and 2 D ECHO - not yet done  HCAP - pneumonia order set in place  - continue vanco and zosyn  - blood culture, legionella, influenza, strep pneumoniae - all negative  - oxygen support via nasal canula to keep O2 saturation above 90%  Hypernatremia - mild, sodium 133 this am Atrial fibrillation  - continue coreg and Eliquis  Left knee pain  - x ray small effusion but no fracture; pain better with pain meds  HTN (hypertension)  - may continue coreg; if P rises will use norvasc per home dose; currently BP 120/55 Dyslipidemia  - continue statin therapy  Leukocytosis  - likely due to pneumonia  - management as above  - WBC count trended down from 26 to 12.8  Code Status: Full  Family Communication: updated the pt daughter at bedside on the admission Disposition Plan: remains inpatient    Consultants:  None  Procedures:  None  Antibiotics:  vanco 3/26 -->  Zosyn 3/26 -->   Manson Passey, MD  [facility] Pager [phone]  If 7PM-7AM, please contact night-coverage [email or web address] Password [email or web address] 01/12/2014, 10:30 AM   LOS: 3 days    HPI/Subjective: No overnight events.   Objective: Filed Vitals:   01/11/14 2000 01/11/14 2359 01/12/14 0400 01/12/14 0805  BP: 134/77 128/80 133/82 120/55  Pulse: 55 70 90 74  Temp: 97.8 F (36.6 C) 98.1 F (36.7 C) 98.9 F (37.2 C) 99.2 F (37.3 C)  TempSrc:    Oral  Resp: 18 18 18 16   Height:      Weight:   82.419 kg (181 lb 11.2 oz)   SpO2:  100% 100% 95% 98%    Intake/Output Summary (Last 24 hours) at 01/12/14 1030 Last data filed at 01/12/14 0500  Gross per 24 hour  Intake      0 ml  Output   1000 ml  Net  -1000 ml    Exam:  General: Pt is alert, confused, no acute distress  Cardiovascular: Regular rate and rhythm, S1/S2 appreciated  Respiratory: Clear to auscultation bilaterally, no wheezing  Abdomen: Soft, non tender, non distended, bowel sounds present  Extremities: Pulses DP and PT palpable bilaterally   Neuro: Grossly nonfocal   Data Reviewed: Basic Metabolic Panel:  Recent Labs Lab 01/09/14 1840 01/10/14 0153 01/11/14 0535  NA 137 138 133*  K 5.3 4.9 4.7  CL 100 101 99  CO2 22 23 18*  GLUCOSE 149* 126* 117*  BUN 33* 30* 24*  CREATININE 1.23 1.16 1.12  CALCIUM 9.3 9.0 9.2  MG  --  2.3  --   PHOS  --  3.9  --    Liver Function Tests:  Recent Labs Lab 01/09/14 1840 01/10/14 0153  AST 90* 63*  ALT 80* 67*  ALKPHOS 173* 156*  BILITOT 0.3 0.4  PROT 7.1 6.9  ALBUMIN 2.5* 2.5*   No results found for this basename: LIPASE, AMYLASE,  in the last 168 hours No results found for this basename: AMMONIA,  in the last 168 hours CBC:  Recent Labs Lab 01/09/14 1840 01/10/14 0153 01/11/14 0535  WBC 30.4* 26.3* 12.8*  NEUTROABS 27.1* 22.1*  --   HGB 13.1 13.1 13.1  HCT 37.9* 38.4* 38.6*  MCV 93.6 94.8 94.1  PLT 415* 390 417*   Cardiac Enzymes: No results found for this basename: CKTOTAL, CKMB, CKMBINDEX, TROPONINI,  in the last 168 hours BNP: No components found with this basename: POCBNP,  CBG:  Recent Labs Lab 01/11/14 0737  GLUCAP 107*    CULTURE, BLOOD (ROUTINE X 2)     Status: None   Collection Time    01/09/14  7:40 PM      Result Value Ref Range Status   Specimen Description BLOOD HAND RIGHT   Final   Value:        BLOOD CULTURE RECEIVED NO GROWTH TO DATE     Performed at [facility]   Report Status PENDING   Incomplete  CULTURE, BLOOD (ROUTINE X 2)     Status: None   Collection Time    01/09/14  8:15 PM      Result Value Ref Range Status   Specimen Description BLOOD ARM LEFT   Final   Value:        BLOOD CULTURE RECEIVED NO GROWTH TO DATE      Performed at [facility]   Report Status PENDING   Incomplete  MRSA PCR SCREENING     Status: None   Collection Time    01/10/14 12:47 AM      Result Value Ref Range Status   MRSA by PCR NEGATIVE  NEGATIVE Final     Studies: Mr Brain Wo Contrast 01/10/2014    IMPRESSION: Restricted  diffusion within the right posterior frontal cortex, right basal ganglia, and periventricular white matter consistent with the history of recent stroke. The distribution of ischemia is consistent with a late acute to subacute right MCA territory insult.  No hemorrhagic transformation, and no multifocal areas of acute infarction which might suggest an embolic phenomenon.  Severe intracranial atherosclerotic disease, most notable in the left greater than right  middle cerebral arteries, left distal vertebral artery, and right P2 posterior cerebral artery. See discussion above.   Electronically Signed   By: Davonna BellingJohn  Curnes M.D.   On: 01/10/2014 15:57   Mr Maxine GlennMra Head/brain Wo Cm 01/10/2014    IMPRESSION: Restricted diffusion within the right posterior frontal cortex, right basal ganglia, and periventricular white matter consistent with the history of recent stroke. The distribution of ischemia is consistent with a late acute to subacute right MCA territory insult.  No hemorrhagic transformation, and no multifocal areas of acute infarction which might suggest an embolic phenomenon.  Severe intracranial atherosclerotic disease, most notable in the left greater than right middle cerebral arteries, left distal vertebral artery, and right P2 posterior cerebral artery. See discussion above.   Electronically Signed   By: Davonna BellingJohn  Curnes M.D.   On: 01/10/2014 15:57    Scheduled Meds: . apixaban  5 mg Oral BID  . atorvastatin  40 mg Oral Daily  . brinzolamide  1 drop Both Eyes BID  . carvedilol  12.5 mg Oral BID WC  . piperacillin-tazobactam   3.375 g Intravenous Q8H  . polyethylene glycol  17 g Oral Daily  . sennosides-docusate   2 tablet Oral BID  . traMADol  25 mg Oral 3 times per day  . Travoprost (BAK Free)  1 drop Both Eyes QHS  . vancomycin  750 mg Intravenous Q12H

## 2014-01-12 NOTE — Progress Notes (Signed)
Echocardiogram 2D Echocardiogram has been performed.  Dorothey BasemanReel, Kyani Simkin M 01/12/2014, 12:42 PM

## 2014-01-12 NOTE — Progress Notes (Signed)
VASCULAR LAB PRELIMINARY  PRELIMINARY  PRELIMINARY  PRELIMINARY  Carotid duplex completed.    Preliminary report:  Bilateral:  1-39% ICA stenosis.  Vertebral artery flow is antegrade.     Herby Amick, RVS 01/12/2014, 12:51 PM

## 2014-01-12 NOTE — Clinical Social Work Note (Signed)
[facility] handoff[facility] may [facility] another [facility]. [facility].  Roddie McBryant Maye Parkinson, [place]CSWA, [place], [phone]

## 2014-01-12 NOTE — Clinical Social Work Note (Signed)
CSW left message for daughter Dois DavenportSandra. Waiting for call back.   Roddie McBryant Maddix Heinz, [place]CSWA, [place]ASA, [phone]

## 2014-01-12 NOTE — Progress Notes (Signed)
ANTIBIOTIC CONSULT NOTE - FOLLOW UP  Pharmacy Consult for vancomycin and Zosyn Indication: HCAP  No Known Allergies  Patient Measurements: Height: 5\' 11"  (180.3 cm) Weight: 181 lb 11.2 oz (82.419 kg) IBW/kg (Calculated) : 75.3  Vital Signs: Temp: 99.2 F (37.3 C) (03/29 0805) Temp src: Oral (03/29 0805) BP: 120/55 mmHg (03/29 0805) Pulse Rate: 74 (03/29 0805) Intake/Output from previous day: 03/28 0701 - 03/29 0700 In: 240 [P.O.:240] Out: 1000 [Urine:1000] Intake/Output from this shift:    Labs:  Recent Labs  01/09/14 1840 01/10/14 0153 01/11/14 0535  WBC 30.4* 26.3* 12.8*  HGB 13.1 13.1 13.1  PLT 415* 390 417*  CREATININE 1.23 1.16 1.12   Estimated Creatinine Clearance: 53.2 ml/min (by C-G formula based on Cr of 1.12). No results found for this basename: VANCOTROUGH, VANCOPEAK, VANCORANDOM, GENTTROUGH, GENTPEAK, GENTRANDOM, TOBRATROUGH, TOBRAPEAK, TOBRARND, AMIKACINPEAK, AMIKACINTROU, AMIKACIN,  in the last 72 hours   Microbiology: Recent Results (from the past 720 hour(s))  CULTURE, BLOOD (ROUTINE X 2)     Status: None   Collection Time    01/09/14  7:40 PM      Result Value Ref Range Status   Specimen Description BLOOD HAND RIGHT   Final   Special Requests BOTTLES DRAWN AEROBIC AND ANAEROBIC 5CC   Final   Culture  Setup Time     Final   Value: 01/10/2014 00:54     Performed at [facility]   Culture     Final   Value:        BLOOD CULTURE RECEIVED NO GROWTH TO DATE CULTURE WILL BE HELD FOR 5 DAYS BEFORE ISSUING A FINAL NEGATIVE REPORT     Performed at [facility]   Report Status PENDING   Incomplete  CULTURE, BLOOD (ROUTINE X 2)     Status: None   Collection Time    01/09/14  8:15 PM      Result Value Ref Range Status   Specimen Description BLOOD ARM LEFT   Final   Special Requests BOTTLES DRAWN AEROBIC AND ANAEROBIC 10CC   Final   Culture  Setup Time     Final   Value: 01/10/2014 00:55     Performed at [facility]   Culture      Final   Value:        BLOOD CULTURE RECEIVED NO GROWTH TO DATE CULTURE WILL BE HELD FOR 5 DAYS BEFORE ISSUING A FINAL NEGATIVE REPORT     Performed at [facility]   Report Status PENDING   Incomplete  MRSA PCR SCREENING     Status: None   Collection Time    01/10/14 12:47 AM      Result Value Ref Range Status   MRSA by PCR NEGATIVE  NEGATIVE Final   Comment:            The GeneXpert MRSA Assay (FDA     approved for NASAL specimens     only), is one component of a     comprehensive MRSA colonization     surveillance program. It is not     intended to diagnose MRSA     infection nor to guide or     monitor treatment for     MRSA infections.  URINE CULTURE     Status: None   Collection Time    01/11/14 12:07 AM      Result Value Ref Range Status   Specimen Description URINE, RANDOM   Final  Special Requests NONE   Final   Culture  Setup Time     Final   Value: 01/11/2014 13:40     [facility]   Colony Count     Final   Value: NO GROWTH     [facility] at [facility]   Culture     Final   Value: NO GROWTH     [facility] at [facility]   Report Status 01/12/2014 FINAL   Final    Anti-infectives   Start     Dose/Rate Route Frequency Ordered Stop   01/10/14 0900  vancomycin (VANCOCIN) IVPB 750 mg/150 ml premix     750 mg 150 mL/hr over 60 Minutes Intravenous Every 12 hours 01/10/14 0006     01/10/14 0500  piperacillin-tazobactam (ZOSYN) IVPB 3.375 g     3.375 g 12.5 mL/hr over 240 Minutes Intravenous Every 8 hours 01/10/14 0006     01/09/14 1915  vancomycin (VANCOCIN) 1,500 mg in sodium chloride 0.9 % 500 mL IVPB     1,500 mg 250 mL/hr over 120 Minutes Intravenous  Once 01/09/14 1903 01/10/14 0115   01/09/14 1915  piperacillin-tazobactam (ZOSYN) IVPB 3.375 g     3.375 g 100 mL/hr over 30 Minutes Intravenous  Once 01/09/14 1903 01/09/14 2058      Assessment: 80 y/o male on day 4 vancomycin and Zosyn for HCAP. Renal  function is stable, WBC trending down, and he is afebrile. Cultures are negative so far. Possible d/c in 24 to 48 hours.  Goal of Therapy:  Vancomycin trough level 15-20 mcg/ml  Plan:  - Continue vancomycin 750 mg IV q12h - Will hold off on trough as may be discontinued in a day or two - Continue Zosyn 3.375 g IV q8h to be infused over 4 hours - Monitor renal function and clinical course  [facility] CharlestonJennifer [place], [address] BoulevardPharm.D., BCPS Clinical Pharmacist Pager: [phone] 01/12/2014 1:41 PM

## 2014-01-12 NOTE — Clinical Social Work Placement (Addendum)
Clinical Social Work Department CLINICAL SOCIAL WORK PLACEMENT NOTE 01/12/2014  Patient:  Joel [name],Joel [name]  Account Number:  [id number] Admit date:  01/09/2014  Clinical Social Worker:  Cherre BlancJOSEPH BRYANT CAMPBELL, [place]CSWA  Date/time:  01/12/2014 08:07 PM  Clinical Social Work is seeking post-discharge placement for this patient at the following level of care:   SKILLED NURSING   (*CSW will update this form in Epic as items are completed)   01/12/2014  Patient/family provided with [facility] of Clinical Social Work's list of facilities offering this level of care within the geographic area requested by the patient (or if unable, by the patient's family).  01/12/2014  Patient/family informed of their freedom to choose among providers that offer the needed level of care, that participate in Medicare, Medicaid or managed care program needed by the patient, have an available bed and are willing to accept the patient.  01/12/2014  Patient/family informed of [facility]' ownership interest in [facility], as well as of the fact that they are under no obligation to receive care at this facility.  PASARR submitted to EDS on  PASARR number received from EDS on   FL2 transmitted to all facilities in geographic area requested by pt/family on  01/12/2014 FL2 transmitted to all facilities within larger geographic area on   Patient informed that his/her managed care company has contracts with or will negotiate with  certain facilities, including the following:     Patient/family informed of bed offers received:  01/13/2014 [facility]ician recommends and patient chooses bed at    [facility]  01/14/2014 Patient to be transferred to facility by [facility]  The following physician request were entered in Epic:   Additional Comments:    Roddie McBryant Campbell, Bryon LionsLCSWA, LCASA, [phone]

## 2014-01-13 DIAGNOSIS — E785 Hyperlipidemia, unspecified: Secondary | ICD-10-CM

## 2014-01-13 LAB — GLUCOSE, CAPILLARY: Glucose-Capillary: 130 mg/dL — ABNORMAL HIGH (ref 70–99)

## 2014-01-13 MED ORDER — OXYCODONE HCL 5 MG PO TABS: 5.0000 mg | ORAL_TABLET | ORAL | Status: DC | PRN

## 2014-01-13 MED ORDER — LEVOFLOXACIN 500 MG PO TABS: 500.0000 mg | ORAL_TABLET | Freq: Every day | ORAL | Status: DC

## 2014-01-13 NOTE — Progress Notes (Signed)
Clinical Social Work Department BRIEF PSYCHOSOCIAL ASSESSMENT 01/13/2014  Patient:  Joel FavaMORGAN,Jiro     Account Number:  [id number]     Admit date:  01/09/2014  Clinical Social Worker:  Harless NakayamaAMBELAL,Natallie Ravenscroft, LCSWA  Date/Time:  01/13/2014 10:30 AM  Referred by:  Physician  Date Referred:  01/13/2014 Referred for  SNF Placement   Other Referral:   Interview type:  Family Other interview type:   Spoke with pt sister and daughter over the phone    PSYCHOSOCIAL DATA Living Status:  FACILITY [facility] Level of care:  Skilled Nursing Facility Primary support name:  Posey ProntoSandra Babineaux [phone] Primary support relationship to patient:  CHILD, ADULT Degree of support available:   Pt has supportive family    CURRENT CONCERNS Current Concerns  Post-Acute Placement   Other Concerns:    SOCIAL WORK ASSESSMENT / PLAN CSW informed pt was admitted from facility but family would like new placement options, and pt is ready for dc today. CSW called pt sister and discussed dc options. Pt sister asked for CSW to please call pt daughter as they were under the impression that pt would be able to receive rehab here at the hospital. CSW called pt daughter and discussed returning to SNF or deciding on new SNF. CSW also explained that at this time [facility] is not being recommended by PT. Pt daughter understanding of this but had numerous concerns with pt being ready for dc today. CSW informed pt daughter that CSW would page MD and ask to please call her. Pt daughter thankful for this. CSW provided pt daughter with alternate bed offers [facility] CSW to follow up with pt daughter on decision after she has spoken with MD and had time to make decision.   Assessment/plan status:  Psychosocial Support/Ongoing Assessment of Needs Other assessment/ plan:   Information/referral to community resources:   Pt daughter already has SNF list     PATIENT'S/FAMILY'S RESPONSE TO PLAN OF CARE: Pt family wanting to arrange for pt to [facility]       Shasta Chinn, LCSWA [phone]

## 2014-01-13 NOTE — Progress Notes (Addendum)
[facility] PROGRESS NOTE  Angie Favaeter Osgood ZOX:[id number] DOB: 06/25/1928 DOA: 01/09/2014 PCP: No primary provider on file.  Brief narrative: 80 year old male with past medical history of recent CVA (right MCA stroke), from SNF, [facility], was found to be in atrial fibrillation and is now on Eliquis for anticoagulation who [facility] with ncreased lethargy and confusion for past 24 hours prior to this admission. In addition, per SNF report patient had a fever there of 101.8 F and his oxygen saturation was 81%. There was no respiratory distress.  In ED, BP was 107/55, HR 80, Tmax 100.95F and oxygen saturation 91% on 2 L [place] oxygen support. CXR shoed possible basilar infiltrates. CT head did not reveal acute intracranial findings but it did show possible evolving infarct in right basal ganglia. Blood work revealed leukocytosis of 30 but otherwise unremarkable. Urinalysis did not show evidence of an infection. Patient was started on broad spectrum antibiotics for possible pneumonia.   Assessment and Plan:   Principal Problem:  Acute respiratory failure with hypoxia  - likely secondary to pneumonia (HCAP); respiratory status continues to be stable - patient was started on broad spectrum antibiotics, vanco and zosyn; since blood cultures show no growth to date will switch to PO on discharge  - continue oxygen support via nasal canula to keep O2 saturation above 90%  - bronchodilators as needed  Active Problems:  Acute encephalopathy  - possible related to pneumonia but evolving infarct also possible; per MRI brain possible late acute to subacute right MCA territory insult, possible embolic phenomenon, no hemorrhagic transformation.  - PT/OT - will return to SNF in am or home with home health - dysphagia 3 diet per SLP  Right MCA CVA - evolving infarct seen on CT head; per MRI brain possible late acute to subacute right MCA territory insult, possible embolic  phenomenon, no hemorrhagic transformation  - carotid doppler normal; 2 D ECHO EF 35-40%  HCAP - pneumonia order set in place  - continue vanco and zosyn  - blood culture, legionella, influenza, strep pneumoniae - all negative  - oxygen support via nasal canula to keep O2 saturation above 90%  Hypernatremia  - mild, likely dehydration  Atrial fibrillation  - continue coreg and Eliquis  Left knee pain  - x ray small effusion but no fracture; pain better with pain meds  HTN (hypertension)  - may continue coreg; if BP rises will use norvasc per home dose; currently BP 120/55  Dyslipidemia  - continue statin therapy  Leukocytosis  - likely due to pneumonia  - management as above  - WBC count trended down from 26 to 12.8   Code Status: Full  Family Communication: updated the pt daughter at bedside on the admission  Disposition Plan: to SNF in am   Consultants:  None  Procedures:  None  Antibiotics:  vanco 3/26 -->  Zosyn 3/26 -->   Manson PasseyEVINE, Alyria Krack, MD  [facility] Pager [phone]  If 7PM-7AM, please contact night-coverage [email or web address] Password [email or web address] 01/13/2014, 2:57 PM   LOS: 4 days    HPI/Subjective: No overnight events.   Objective: Filed Vitals:   01/13/14 0100 01/13/14 0500 01/13/14 0845 01/13/14 1209  BP: 128/57 135/65 144/68 149/74  Pulse: 66 68 62 64  Temp: 98.9 F (37.2 C) 98.2 F (36.8 C) 98.7 F (37.1 C) 97.4 F (36.3 C)  TempSrc:   Oral Oral  Resp: 16 16 16 15   Height:  Weight:  86.183 kg (190 lb)    SpO2: 99% 100% 98% 100%    Intake/Output Summary (Last 24 hours) at 01/13/14 1457 Last data filed at 01/13/14 0500  Gross per 24 hour  Intake      0 ml  Output   1200 ml  Net  -1200 ml    Exam:   General:  Pt is alert, no acute distress  Cardiovascular: Regular rate and rhythm, S1/S2 appreciated  Respiratory: Clear to auscultation bilaterally, no wheezing  Abdomen: Soft, non tender, non distended  Extremities: pulses  palpable  Neuro: Grossly nonfocal  Data Reviewed: Basic Metabolic Panel:  Recent Labs Lab 01/09/14 1840 01/10/14 0153 01/11/14 0535  NA 137 138 133*  K 5.3 4.9 4.7  CL 100 101 99  CO2 22 23 18*  GLUCOSE 149* 126* 117*  BUN 33* 30* 24*  CREATININE 1.23 1.16 1.12  CALCIUM 9.3 9.0 9.2  MG  --  2.3  --   PHOS  --  3.9  --    Liver Function Tests:  Recent Labs Lab 01/09/14 1840 01/10/14 0153  AST 90* 63*  ALT 80* 67*  ALKPHOS 173* 156*  BILITOT 0.3 0.4  PROT 7.1 6.9  ALBUMIN 2.5* 2.5*   No results found for this basename: LIPASE, AMYLASE,  in the last 168 hours No results found for this basename: AMMONIA,  in the last 168 hours CBC:  Recent Labs Lab 01/09/14 1840 01/10/14 0153 01/11/14 0535  WBC 30.4* 26.3* 12.8*  NEUTROABS 27.1* 22.1*  --   HGB 13.1 13.1 13.1  HCT 37.9* 38.4* 38.6*  MCV 93.6 94.8 94.1  PLT 415* 390 417*   Cardiac Enzymes: No results found for this basename: CKTOTAL, CKMB, CKMBINDEX, TROPONINI,  in the last 168 hours BNP: No components found with this basename: POCBNP,  CBG:  Recent Labs Lab 01/11/14 0737 01/13/14 0735  GLUCAP 107* 130*    Recent Results (from the past 240 hour(s))  CULTURE, BLOOD (ROUTINE X 2)     Status: None   Collection Time    01/09/14  7:40 PM      Result Value Ref Range Status   Specimen Description BLOOD HAND RIGHT   Final   Special Requests BOTTLES DRAWN AEROBIC AND ANAEROBIC 5CC   Final   Culture  Setup Time     Final   Value: 01/10/2014 00:54     Performed at [facility]   Culture     Final   Value:        BLOOD CULTURE RECEIVED NO GROWTH TO DATE CULTURE WILL BE HELD FOR 5 DAYS BEFORE ISSUING A FINAL NEGATIVE REPORT     Performed at [facility]   Report Status PENDING   Incomplete  CULTURE, BLOOD (ROUTINE X 2)     Status: None   Collection Time    01/09/14  8:15 PM      Result Value Ref Range Status   Specimen Description BLOOD ARM LEFT   Final   Special Requests BOTTLES DRAWN  AEROBIC AND ANAEROBIC 10CC   Final   Culture  Setup Time     Final   Value: 01/10/2014 00:55     Performed at [facility]   Culture     Final   Value:        BLOOD CULTURE RECEIVED NO GROWTH TO DATE CULTURE WILL BE HELD FOR 5 DAYS BEFORE ISSUING A FINAL NEGATIVE REPORT     Performed at [facility]   Report Status PENDING   Incomplete  MRSA PCR SCREENING     Status: None   Collection Time    01/10/14 12:47 AM      Result Value Ref Range Status   MRSA by PCR NEGATIVE  NEGATIVE Final   Comment:            The GeneXpert MRSA Assay (FDA     approved for NASAL specimens     only), is one component of a     comprehensive MRSA colonization     surveillance program. It is not     intended to diagnose MRSA     infection nor to guide or     monitor treatment for     MRSA infections.  URINE CULTURE     Status: None   Collection Time    01/11/14 12:07 AM      Result Value Ref Range Status   Specimen Description URINE, RANDOM   Final   Special Requests NONE   Final   Culture  Setup Time     Final   Value: 01/11/2014 13:40     Performed at [facility] Count     Final   Value: NO GROWTH     Performed at [facility]   Culture     Final   Value: NO GROWTH     Performed at [facility]   Report Status 01/12/2014 FINAL   Final     Studies: No results found.  Scheduled Meds: . apixaban  5 mg Oral BID  . atorvastatin  40 mg Oral Daily  . brinzolamide  1 drop Both Eyes BID  . carvedilol  12.5 mg Oral BID WC  . piperacillin-tazobactam (ZOSYN)  IV  3.375 g Intravenous Q8H  . polyethylene glycol  17 g Oral Daily  . sennosides-docusate sodium  2 tablet Oral BID  . sodium chloride  3 mL Intravenous Q12H  . traMADol  25 mg Oral 3 times per day  . Travoprost (BAK Free)  1 drop Both Eyes QHS  . vancomycin  750 mg Intravenous Q12H   Continuous Infusions:

## 2014-01-13 NOTE — Progress Notes (Signed)
Physical Therapy Treatment Patient Details Name: Joel [name] MRN: [id number] DOB: 01/14/1928 Today's Date: 01/13/2014    History of Present Illness 80 year old male with past medical history of recent CVA (right MCA stroke), [facility], was found to be in atrial fibrillation and is now on Eliquis for anticoagulation who [facility] with complaints of increased lethargy and confusion as noted by his family for past 24 hours. Patient is from nursing home and per SNF report patient had a fever there of 101.8 F and his oxygen saturation was 81%. There was no respiratory distress. No complaints of chest pain, palpitations of shortness of breath. No cough. No abdominal pain, no nausea or vomiting. No diarrhea and no blood in stool or urine.      PT Comments    **+2 total assist for supine to sit and to pivot to recliner. Mod assist for static sitting balance. LLE pain limits activity tolerance. Increased assist for mobility noted today.  *  Follow Up Recommendations  SNF     Equipment Recommendations  None recommended by PT    Recommendations for Other Services       Precautions / Restrictions Precautions Precautions: Fall Restrictions Weight Bearing Restrictions: No    Mobility  Bed Mobility Overal bed mobility: Needs Assistance Bed Mobility: Supine to Sit;Rolling Rolling: +2 for physical assistance;Total assist   Supine to sit: +2 for physical assistance;Total assist     General bed mobility comments: pt 20%, assist to initiate movement and to raise trunk and advance BLEs, pt reports pain in L knee with movement  Transfers Overall transfer level: Needs assistance Equipment used:  (2 person gait belt and bed pad) Transfers: Sit to/from BJ'sStand;Stand Pivot Transfers Sit to Stand: +2 physical assistance;Total assist Stand pivot transfers: +2 physical assistance;Total assist       General transfer comment: pt unable to acheive full upright  standing position with 2 person total A. pt req blocking on BIlat LE and knees with gait belt and bed pad transfer. verbal cues for safety and hand placement  Ambulation/Gait                 Stairs            Wheelchair Mobility    Modified Rankin (Stroke Patients Only) Modified Rankin (Stroke Patients Only) Pre-Morbid Rankin Score: Severe disability Modified Rankin: Severe disability     Balance     Sitting balance-Leahy Scale: Poor Sitting balance - Comments: pt req initial max A for trunk due to posterior lean and verbal cues for hand placement and to adjust to midline. pt eventually able to maintain sitting balance with min guard. Pt sat on EOB x 8 minutes.  Postural control: Posterior lean                          Cognition Arousal/Alertness: Awake/alert Behavior During Therapy: WFL for tasks assessed/performed Overall Cognitive Status: Within Functional Limits for tasks assessed                      Exercises General Exercises - Lower Extremity Ankle Circles/Pumps: PROM;Left;15 reps;Seated Long Arc Quad: AAROM;Left;15 reps;Seated    General Comments        Pertinent Vitals/Pain *L knee pain with movement, RN notified **    Home Living  Prior Function            PT Goals (current goals can now be found in the care plan section) Acute Rehab PT Goals Patient Stated Goal: none stated PT Goal Formulation: With patient Time For Goal Achievement: 01/18/14 Potential to Achieve Goals: Good Progress towards PT goals: Not progressing toward goals - comment (total assist required for bed mobilityand transfer)    Frequency  Min 3X/week    PT Plan Current plan remains appropriate    End of Session Equipment Utilized During Treatment: Gait belt;Oxygen (3L O2 on wall) Activity Tolerance: Patient limited by fatigue Patient left: in chair;with call bell/phone within reach     Time: 1115-1144 PT Time  Calculation (min): 29 min  Charges:  $Therapeutic Exercise: 23-37 mins                    G Codes:      Tamala Ser 01/13/2014, 11:51 AM [phone]

## 2014-01-13 NOTE — Progress Notes (Signed)
CSW Proofreader(Clinical Social Worker) spoke with pt daughter over the phone again this morning. Pt daughter stated that MD will be keeping pt one more night. Pt daughter to review bed offers with other family members and call CSW with decision by tomorrow morning.  Joel [name], LCSWA [phone]

## 2014-01-14 MED ORDER — OXYCODONE HCL 5 MG PO TABS: 5.0000 mg | ORAL_TABLET | ORAL | Status: DC | PRN

## 2014-01-14 MED ORDER — IPRATROPIUM-ALBUTEROL 0.5-2.5 (3) MG/3ML IN SOLN: 3.0000 mL | RESPIRATORY_TRACT | Status: DC | PRN

## 2014-01-14 MED ORDER — LEVOFLOXACIN 500 MG PO TABS: 500.0000 mg | ORAL_TABLET | Freq: Every day | ORAL | Status: DC

## 2014-01-14 NOTE — Discharge Summary (Addendum)
Physician Discharge Summary  Dyke Weible ZOX:[id number] DOB: 03-17-1928 DOA: 01/09/2014  PCP: No primary provider on file.  Admit date: 01/09/2014 Discharge date: 01/14/2014  Recommendations for Outpatient Follow-up:  Please continue Levaquin for 5 more days on discharge Dysphagia 3 diet or soft mechanical food per SLP recommendations  Discharge Diagnoses:  Principal Problem:   Respiratory failure with hypoxia Active Problems:   Acute encephalopathy   PNA (pneumonia)   Atrial fibrillation   HTN (hypertension)   Dyslipidemia   Leukocytosis    Discharge Condition: medically stable for discharge to SNF today   Diet recommendation: dysphagia 3 diet   History of present illness:  80 year old male with past medical history of recent CVA (right MCA stroke), from SNF, hospitalized at [facility] hospital, was found to be in atrial fibrillation and is now on Eliquis for anticoagulation who presented to [facility] ED 01/09/2014 with ncreased lethargy and confusion for past 24 hours prior to this admission. In addition, per SNF report patient had a fever there of 101.8 F and his oxygen saturation was 81%. There was no respiratory distress.  In ED, BP was 107/55, HR 80, Tmax 100.71F and oxygen saturation 91% on 2 L [place] oxygen support. CXR shoed possible basilar infiltrates. CT head did not reveal acute intracranial findings but it did show possible evolving infarct in right basal ganglia. Blood work revealed leukocytosis of 30 but otherwise unremarkable. Urinalysis did not show evidence of an infection. Patient was started on broad spectrum antibiotics for possible pneumonia.   Assessment and Plan:   Principal Problem:  Acute respiratory failure with hypoxia  - likely secondary to pneumonia (HCAP); respiratory status continues to be stable  - patient was started on broad spectrum antibiotics, vanco and zosyn; since blood cultures show no growth to date will switch to Levaquin on discharge for 5 days  - continue  oxygen support via nasal canula to keep O2 saturation above 90%  - bronchodilators as needed given in hospital  Active Problems:  Acute encephalopathy  - possible related to pneumonia but evolving infarct also possible; per MRI brain possible late acute to subacute right MCA territory insult, possible embolic phenomenon, no hemorrhagic transformation.  - PT/OT - will return to SNF  - dysphagia 3 diet per SLP  Right MCA CVA - evolving infarct seen on CT head; per MRI brain possible late acute to subacute right MCA territory insult, possible embolic phenomenon, no hemorrhagic transformation  - carotid doppler normal; 2 D ECHO EF 35-40%  HCAP - pneumonia order set in place  - as mentioned above, was on vanco and zosyn but will switch to PO Levaquin on discharge  - blood culture, legionella, influenza, strep pneumoniae - all negative  - oxygen support via nasal canula to keep O2 saturation above 90%  Hypernatremia  - mild, likely dehydration  Atrial fibrillation  - continue coreg and Eliquis  Left knee pain  - x ray small effusion but no fracture; pain better with pain meds  HTN (hypertension)  - may continue coreg and norvasc Dyslipidemia  - continue statin therapy  Leukocytosis  - likely due to pneumonia  - management as above  - WBC count trended down from 26 to 12.8   Code Status: Full  Family Communication: updated the pt daughter at bedside on the admission   Consultants:  None  Procedures:  None  Antibiotics:  vanco 3/26 --> 3/31 Zosyn 3/26 --> 3/31 Levaquin for 5 days on discharge   Signed:  Jia Dottavio,  Jaymon Dudek, MD  [facility] 01/14/2014, 9:26 AM  Pager #: [phone]    Discharge Exam: Filed Vitals:   01/14/14 0835  BP: 142/96  Pulse: 73  Temp: 98.4 F (36.9 C)  Resp: 20   Filed Vitals:   01/13/14 2000 01/14/14 0000 01/14/14 0400 01/14/14 0835  BP: 157/68 153/73 145/65 142/96  Pulse: 71 82 86 73  Temp: 98.9 F (37.2 C) 99.5 F (37.5 C) 99.2 F  (37.3 C) 98.4 F (36.9 C)  TempSrc: Oral Oral Oral Oral  Resp: 20 20 20 20   Height:      Weight:  83.099 kg (183 lb 3.2 oz)    SpO2: 100% 99% 98% 98%    Exam:  General: Pt is alert, no acute distress  Cardiovascular: Regular rate and rhythm, S1/S2 appreciated  Respiratory: Clear to auscultation bilaterally, no wheezing  Abdomen: Soft, non tender, non distended  Extremities: pulses palpable  Neuro: Grossly nonfocal  Discharge Instructions  Discharge Orders   Future Orders Complete By Expires   Call MD for:  difficulty breathing, headache or visual disturbances  As directed    Call MD for:  persistant dizziness or light-headedness  As directed    Call MD for:  persistant nausea and vomiting  As directed    Call MD for:  severe uncontrolled pain  As directed    Diet - low sodium heart healthy  As directed    Discharge instructions  As directed    Comments:     Please continue Levaquin for 5 more days on discharge Dysphagia 3 diet or soft mechanical food per SLP recommendations   Increase activity slowly  As directed        Medication List    STOP taking these medications       amoxicillin-clavulanate 875-125 MG per tablet  Commonly known as:  AUGMENTIN      TAKE these medications       acetaminophen 325 MG tablet  Commonly known as:  TYLENOL  Take 975 mg by mouth 3 (three) times daily.     amLODipine 10 MG tablet  Commonly known as:  NORVASC  Take 10 mg by mouth daily.     apixaban 5 MG Tabs tablet  Commonly known as:  ELIQUIS  Take 5 mg by mouth 2 (two) times daily.     atorvastatin 40 MG tablet  Commonly known as:  LIPITOR  Take 40 mg by mouth daily.     brinzolamide 1 % ophthalmic suspension  Commonly known as:  AZOPT  Place 1 drop into both eyes 2 (two) times daily.     Brinzolamide-Brimonidine 1-0.2 % Susp  Place 1 drop into both eyes 3 (three) times daily.     carvedilol 12.5 MG tablet  Commonly known as:  COREG  Take 12.5 mg by mouth 2 (two)  times daily with a meal.     ipratropium-albuterol 0.5-2.5 (3) MG/3ML Soln  Commonly known as:  DUONEB  Take 3 mLs by nebulization every 4 (four) hours as needed.     levofloxacin 500 MG tablet  Commonly known as:  LEVAQUIN  Take 1 tablet (500 mg total) by mouth daily.     lidocaine 5 %  Commonly known as:  LIDODERM  Place 1 patch onto the skin daily. Remove & Discard patch within 12 hours or as directed by MD     oxyCODONE 5 MG immediate release tablet  Commonly known as:  Oxy IR/ROXICODONE  Take 1 tablet (5 mg total)  by mouth every 4 (four) hours as needed for severe pain.     polyethylene glycol packet  Commonly known as:  MIRALAX / GLYCOLAX  Take 17 g by mouth daily.     sennosides-docusate sodium 8.6-50 MG tablet  Commonly known as:  SENOKOT-S  Take 2 tablets by mouth 2 (two) times daily.     traMADol 50 MG tablet  Commonly known as:  ULTRAM  Take 25 mg by mouth every 8 (eight) hours.     travoprost (benzalkonium) 0.004 % ophthalmic solution  Commonly known as:  TRAVATAN  Place 1 drop into both eyes at bedtime.            Follow-up Information   Schedule an appointment as soon as possible for a visit with Manson Passey, MD.   Specialty:  Internal Medicine   Contact information:   [address] [place] [address] [phone]        The results of significant diagnostics from this hospitalization (including imaging, microbiology, ancillary and laboratory) are listed below for reference.    Significant Diagnostic Studies: Dg Chest 2 View 01/09/2014    IMPRESSION: 1. Mild diffuse peribronchial cuffing. This could suggest a mild bronchitis. 2. Mild cardiomegaly. 3. Atherosclerosis.   Electronically Signed   By: Trudie Reed M.D.   On: 01/09/2014 23:25   Dg Chest 2 View 01/09/2014  MPRESSION: Hypoinflation with mild bibasilar opacity, likely atelectasis. No pneumothorax identified.   Electronically Signed   By: Sebastian Ache   On: 01/09/2014 19:38    Ct Head Wo Contrast 01/09/2014    IMPRESSION: 1. No evidence of acute intracranial hemorrhage or definite new infarct. 2. Stable to slightly decreased prominence right basal ganglia hypoattenuation, which may reflect evolving infarct.   Electronically Signed   By: Sebastian Ache   On: 01/09/2014 20:41   Mr Brain Wo Contrast 01/10/2014     IMPRESSION: Restricted diffusion within the right posterior frontal cortex, right basal ganglia, and periventricular white matter consistent with the history of recent stroke. The distribution of ischemia is consistent with a late acute to subacute right MCA territory insult.  No hemorrhagic transformation, and no multifocal areas of acute infarction which might suggest an embolic phenomenon.  Severe intracranial atherosclerotic disease, most notable in the left greater than right middle cerebral arteries, left distal vertebral artery, and right P2 posterior cerebral artery. See discussion above.   Electronically Signed   By: Davonna Belling M.D.   On: 01/10/2014 15:57   Dg Knee Left Port 01/10/2014  IMPRESSION: 1. Somewhat limited study due to patient positioning. No definite acute fracture or dislocation. 2. Severe tricompartmental degenerative osteoarthrosis. 3. Small joint effusion.   Electronically Signed   By: Rise Mu M.D.   On: 01/10/2014 00:27   Mr Maxine Glenn Head/brain Wo Cm 01/10/2014  IMPRESSION: Restricted diffusion within the right posterior frontal cortex, right basal ganglia, and periventricular white matter consistent with the history of recent stroke. The distribution of ischemia is consistent with a late acute to subacute right MCA territory insult.  No hemorrhagic transformation, and no multifocal areas of acute infarction which might suggest an embolic phenomenon.  Severe intracranial atherosclerotic disease, most notable in the left greater than right middle cerebral arteries, left distal vertebral artery, and right P2 posterior cerebral artery. See  discussion above.   Electronically Signed   By: Davonna Belling M.D.   On: 01/10/2014 15:57    Microbiology: Recent Results (from the past 240 hour(s))  CULTURE,  BLOOD (ROUTINE X 2)     Status: None   Collection Time    01/09/14  7:40 PM      Result Value Ref Range Status   Specimen Description BLOOD HAND RIGHT   Final   Special Requests BOTTLES DRAWN AEROBIC AND ANAEROBIC 5CC   Final   Culture  Setup Time     Final   Value: 01/10/2014 00:54     Performed at [facility]   Culture     Final   Value:        BLOOD CULTURE RECEIVED NO GROWTH TO DATE CULTURE WILL BE HELD FOR 5 DAYS BEFORE ISSUING A FINAL NEGATIVE REPORT     Performed at [facility]   Report Status PENDING   Incomplete  CULTURE, BLOOD (ROUTINE X 2)     Status: None   Collection Time    01/09/14  8:15 PM      Result Value Ref Range Status   Specimen Description BLOOD ARM LEFT   Final   Special Requests BOTTLES DRAWN AEROBIC AND ANAEROBIC 10CC   Final   Culture  Setup Time     Final   Value: 01/10/2014 00:55     Performed at [facility]   Culture     Final   Value:        BLOOD CULTURE RECEIVED NO GROWTH TO DATE CULTURE WILL BE HELD FOR 5 DAYS BEFORE ISSUING A FINAL NEGATIVE REPORT     Performed at [facility]   Report Status PENDING   Incomplete  MRSA PCR SCREENING     Status: None   Collection Time    01/10/14 12:47 AM      Result Value Ref Range Status   MRSA by PCR NEGATIVE  NEGATIVE Final   Comment:            The GeneXpert MRSA Assay (FDA     approved for NASAL specimens     only), is one component of a     comprehensive MRSA colonization     surveillance program. It is not     intended to diagnose MRSA     infection nor to guide or     monitor treatment for     MRSA infections.  URINE CULTURE     Status: None   Collection Time    01/11/14 12:07 AM      Result Value Ref Range Status   Specimen Description URINE, RANDOM   Final   Special Requests NONE   Final   Culture   Setup Time     Final   Value: 01/11/2014 13:40     Performed at [facility]   Colony Count     Final   Value: NO GROWTH     Performed at [facility]   Culture     Final   Value: NO GROWTH     Performed at [facility]   Report Status 01/12/2014 FINAL   Final     Labs: Basic Metabolic Panel:  Recent Labs Lab 01/09/14 1840 01/10/14 0153 01/11/14 0535  NA 137 138 133*  K 5.3 4.9 4.7  CL 100 101 99  CO2 22 23 18*  GLUCOSE 149* 126* 117*  BUN 33* 30* 24*  CREATININE 1.23 1.16 1.12  CALCIUM 9.3 9.0 9.2  MG  --  2.3  --   PHOS  --  3.9  --    Liver Function Tests:  Recent Labs Lab 01/09/14 1840 01/10/14 0153  AST 90* 63*  ALT 80* 67*  ALKPHOS 173* 156*  BILITOT 0.3 0.4  PROT 7.1 6.9  ALBUMIN 2.5* 2.5*   No results found for this basename: LIPASE, AMYLASE,  in the last 168 hours No results found for this basename: AMMONIA,  in the last 168 hours CBC:  Recent Labs Lab 01/09/14 1840 01/10/14 0153 01/11/14 0535  WBC 30.4* 26.3* 12.8*  NEUTROABS 27.1* 22.1*  --   HGB 13.1 13.1 13.1  HCT 37.9* 38.4* 38.6*  MCV 93.6 94.8 94.1  PLT 415* 390 417*   Cardiac Enzymes: No results found for this basename: CKTOTAL, CKMB, CKMBINDEX, TROPONINI,  in the last 168 hours BNP: BNP (last 3 results) No results found for this basename: PROBNP,  in the last 8760 hours CBG:  Recent Labs Lab 01/11/14 0737 01/13/14 0735  GLUCAP 107* 130*    Time coordinating discharge: Over 30 minutes

## 2014-01-14 NOTE — Discharge Instructions (Addendum)
Pneumonia, Adult °Pneumonia is an infection of the lungs.  °CAUSES °Pneumonia may be caused by bacteria or a virus. Usually, these infections are caused by breathing infectious particles into the lungs (respiratory tract). °SYMPTOMS  °· Cough. °· Fever. °· Chest pain. °· Increased rate of breathing. °· Wheezing. °· Mucus production. °DIAGNOSIS  °If you have the common symptoms of pneumonia, your caregiver will typically confirm the diagnosis with a chest X-ray. The X-ray will show an abnormality in the lung (pulmonary infiltrate) if you have pneumonia. Other tests of your blood, urine, or sputum may be done to find the specific cause of your pneumonia. Your caregiver may also do tests (blood gases or pulse oximetry) to see how well your lungs are working. °TREATMENT  °Some forms of pneumonia may be spread to other people when you cough or sneeze. You may be asked to wear a mask before and during your exam. Pneumonia that is caused by bacteria is treated with antibiotic medicine. Pneumonia that is caused by the influenza virus may be treated with an antiviral medicine. Most other viral infections must run their course. These infections will not respond to antibiotics.  °PREVENTION °A pneumococcal shot (vaccine) is available to prevent a common bacterial cause of pneumonia. This is usually suggested for: °· People over 65 years old. °· Patients on chemotherapy. °· People with chronic lung problems, such as bronchitis or emphysema. °· People with immune system problems. °If you are over 65 or have a high risk condition, you may receive the pneumococcal vaccine if you have not received it before. In some countries, a routine influenza vaccine is also recommended. This vaccine can help prevent some cases of pneumonia. You may be offered the influenza vaccine as part of your care. °If you smoke, it is time to quit. You may receive instructions on how to stop smoking. Your caregiver can provide medicines and counseling to  help you quit. °HOME CARE INSTRUCTIONS  °· Cough suppressants may be used if you are losing too much rest. However, coughing protects you by clearing your lungs. You should avoid using cough suppressants if you can. °· Your caregiver may have prescribed medicine if he or she thinks your pneumonia is caused by a bacteria or influenza. Finish your medicine even if you start to feel better. °· Your caregiver may also prescribe an expectorant. This loosens the mucus to be coughed up. °· Only take over-the-counter or prescription medicines for pain, discomfort, or fever as directed by your caregiver. °· Do not smoke. Smoking is a common cause of bronchitis and can contribute to pneumonia. If you are a smoker and continue to smoke, your cough may last several weeks after your pneumonia has cleared. °· A cold steam vaporizer or humidifier in your room or home may help loosen mucus. °· Coughing is often worse at night. Sleeping in a semi-upright position in a recliner or using a couple pillows under your head will help with this. °· Get rest as you feel it is needed. Your body will usually let you know when you need to rest. °SEEK IMMEDIATE MEDICAL CARE IF:  °· Your illness becomes worse. This is especially true if you are elderly or weakened from any other disease. °· You cannot control your cough with suppressants and are losing sleep. °· You begin coughing up blood. °· You develop pain which is getting worse or is uncontrolled with medicines. °· You have a fever. °· Any of the symptoms which initially brought you in for treatment   are getting worse rather than better.  You develop shortness of breath or chest pain. MAKE SURE YOU:   Understand these instructions.  Will watch your condition.  Will get help right away if you are not doing well or get worse. Document Released: 10/03/2005 Document Revised: 12/26/2011 Document Reviewed: 12/23/2010 [facility] Patient Information 2014 [place], [place]. STROKE/TIA  DISCHARGE INSTRUCTIONS SMOKING Cigarette smoking nearly doubles your risk of having a stroke & is the single most alterable risk factor  If you smoke or have smoked in the last 12 months, you are advised to quit smoking for your health.  Most of the excess cardiovascular risk related to smoking disappears within a year of stopping.  Ask you doctor about anti-smoking medications  [place] Quit Line: 1-800-QUIT NOW  Free Smoking Cessation Classes [phone]  CHOLESTEROL Know your levels; limit fat & cholesterol in your diet  Lipid Panel     Component Value Date/Time   CHOL 78 01/10/2014 0410   TRIG 62 01/10/2014 0410   HDL 21* 01/10/2014 0410   CHOLHDL 3.7 01/10/2014 0410   VLDL 12 01/10/2014 0410   LDLCALC 45 01/10/2014 0410      Many patients benefit from treatment even if their cholesterol is at goal.  Goal: Total Cholesterol (CHOL) less than 160  Goal:  Triglycerides (TRIG) less than 150  Goal:  HDL greater than 40  Goal:  LDL (LDLCALC) less than 100   BLOOD PRESSURE American Stroke Association blood pressure target is less that 120/80 mm/Hg  Your discharge blood pressure is:  BP: 142/96 mmHg  Monitor your blood pressure  Limit your salt and alcohol intake  Many individuals will require more than one medication for high blood pressure  DIABETES (A1c is a blood sugar average for last 3 months) Goal HGBA1c is under 7% (HBGA1c is blood sugar average for last 3 months)  Diabetes:     Lab Results  Component Value Date   HGBA1C 6.8* 01/10/2014     Your HGBA1c can be lowered with medications, healthy diet, and exercise.  Check your blood sugar as directed by your physician  Call your physician if you experience unexplained or low blood sugars.  PHYSICAL ACTIVITY/REHABILITATION Goal is 30 minutes at least 4 days per week  Activity: Increase activity slowly, Therapies: Physical Therapy: Nursing Facility Return to work: N/A  Activity decreases your risk of heart attack and  stroke and makes your heart stronger.  It helps control your weight and blood pressure; helps you relax and can improve your mood.  Participate in a regular exercise program.  Talk with your doctor about the best form of exercise for you (dancing, walking, swimming, cycling).  DIET/WEIGHT Goal is to maintain a healthy weight  Your discharge diet is: Dysphagia thin liquids Your height is:  Height: 5\' 11"  (180.3 cm) Your current weight is: Weight: 83.099 kg (183 lb 3.2 oz) Your Body Mass Index (BMI) is:  BMI (Calculated): 26.4  Following the type of diet specifically designed for you will help prevent another stroke.  Your goal weight range is:  155-189  Your goal Body Mass Index (BMI) is 19-24.  Healthy food habits can help reduce 3 risk factors for stroke:  High cholesterol, hypertension, and excess weight.  RESOURCES Stroke/Support Group:  Call [phone]   STROKE EDUCATION PROVIDED/REVIEWED AND GIVEN TO PATIENT Stroke warning signs and symptoms How to activate emergency medical system (call 911). Medications prescribed at discharge. Need for follow-up after discharge. Personal risk factors for stroke. Pneumonia  vaccine given: No Flu vaccine given: No My questions have been answered, the writing is legible, and I understand these instructions.  I will adhere to these goals & educational materials that have been provided to me after my discharge from the hospital.

## 2014-01-14 NOTE — Progress Notes (Signed)
CSW Proofreader(Clinical Social Worker) spoke with pt daughter and [facility] CSW confirmed with facility that they can accept pt today. CSW prepared pt dc packet and placed with shadow chart. CSW arranged non-emergent ambulance transport at 1pm. Pt, pt family, pt nurse, and facility informed. CSW signing off.   Dorsie Burich, LCSWA [phone]

## 2014-01-14 NOTE — Progress Notes (Signed)
Speech Language Pathology Treatment: Dysphagia  Patient Details Name: Joel [name] Old MRN: [id number] DOB: 11/27/1927 Today's Date: 01/14/2014 Time: 1000-1020 SLP Time Calculation (min): 20 min  Assessment / Plan / Recommendation Clinical Impression  Skilled observation of soft solids/thin via straw without overt s/s of aspiration noted; patient c/o one incident of "coughing during meal when someone asked him a question", but otherwise, nursing stated medication administration and consumption of meals appear functional at present time; pt with slow but thorough mastication of soft solids and thin via straw with small sips/bites encouraged with minimal verbal cueing; A-P transit appears adequate with all consistencies assessed; pt able to brush teeth independently with one cough noted during swishing and pt informed of risk of aspiration with larger sips while brushing teeth/rinsing. Pt in agreement to use smaller sips to rinse oral residue while brushing; pt oriented to self and day; recommend f/u x1 d/t pt c/o "coughing" and minimal risk for aspiration d/t PMH of R MCA CVA and residual left weakness   HPI HPI: 80 year old male admitted 01/09/14 due to AMS.  PMH significant for R MCA CVA.   Pertinent Vitals WDL  SLP Plan  Continue with current plan of care    Recommendations Diet recommendations: Dysphagia 3 (mechanical soft);Thin liquid Liquids provided via: Straw;Cup Medication Administration: Whole meds with liquid Supervision: Patient able to self feed Compensations: Slow rate;Small sips/bites Postural Changes and/or Swallow Maneuvers: Seated upright 90 degrees;Upright 30-60 min after meal              Oral Care Recommendations: Oral care BID Follow up Recommendations: 24 hour supervision/assistance;Skilled Nursing facility Plan: Continue with current plan of care         Peggyann Zwiefelhofer,PAT, CCC-SLP 01/14/2014, 11:42 AM

## 2014-01-15 ENCOUNTER — Other Ambulatory Visit: Payer: Self-pay | Admitting: *Deleted

## 2014-01-16 LAB — CULTURE, BLOOD (ROUTINE X 2)
Culture: NO GROWTH
Culture: NO GROWTH

## 2014-01-19 ENCOUNTER — Emergency Department (HOSPITAL_COMMUNITY): Payer: PRIVATE HEALTH INSURANCE

## 2014-01-19 ENCOUNTER — Inpatient Hospital Stay (HOSPITAL_COMMUNITY)
Admission: EM | Admit: 2014-01-19 | Discharge: 2014-01-22 | DRG: 917 | Disposition: A | Discharge status: Skilled Nursing Facility | Payer: PRIVATE HEALTH INSURANCE | Attending: Internal Medicine | Admitting: Internal Medicine

## 2014-01-19 ENCOUNTER — Encounter (HOSPITAL_COMMUNITY): Payer: Self-pay | Admitting: Emergency Medicine

## 2014-01-19 DIAGNOSIS — T398X1A Poisoning by other nonopioid analgesics and antipyretics, not elsewhere classified, accidental (unintentional), initial encounter: Principal | ICD-10-CM | POA: Diagnosis present

## 2014-01-19 DIAGNOSIS — G9340 Encephalopathy, unspecified: Secondary | ICD-10-CM | POA: Diagnosis present

## 2014-01-19 DIAGNOSIS — I4891 Unspecified atrial fibrillation: Secondary | ICD-10-CM | POA: Diagnosis present

## 2014-01-19 DIAGNOSIS — Z7901 Long term (current) use of anticoagulants: Secondary | ICD-10-CM

## 2014-01-19 DIAGNOSIS — E785 Hyperlipidemia, unspecified: Secondary | ICD-10-CM

## 2014-01-19 DIAGNOSIS — R4182 Altered mental status, unspecified: Secondary | ICD-10-CM

## 2014-01-19 DIAGNOSIS — M25469 Effusion, unspecified knee: Secondary | ICD-10-CM | POA: Diagnosis present

## 2014-01-19 DIAGNOSIS — Z8673 Personal history of transient ischemic attack (TIA), and cerebral infarction without residual deficits: Secondary | ICD-10-CM

## 2014-01-19 DIAGNOSIS — D72829 Elevated white blood cell count, unspecified: Secondary | ICD-10-CM | POA: Diagnosis present

## 2014-01-19 DIAGNOSIS — R7401 Elevation of levels of liver transaminase levels: Secondary | ICD-10-CM | POA: Diagnosis present

## 2014-01-19 DIAGNOSIS — E7800 Pure hypercholesterolemia, unspecified: Secondary | ICD-10-CM | POA: Diagnosis present

## 2014-01-19 DIAGNOSIS — R740 Nonspecific elevation of levels of transaminase and lactic acid dehydrogenase [LDH]: Secondary | ICD-10-CM

## 2014-01-19 DIAGNOSIS — J189 Pneumonia, unspecified organism: Secondary | ICD-10-CM

## 2014-01-19 DIAGNOSIS — E119 Type 2 diabetes mellitus without complications: Secondary | ICD-10-CM | POA: Diagnosis present

## 2014-01-19 DIAGNOSIS — I10 Essential (primary) hypertension: Secondary | ICD-10-CM | POA: Diagnosis present

## 2014-01-19 DIAGNOSIS — R7402 Elevation of levels of lactic acid dehydrogenase (LDH): Secondary | ICD-10-CM | POA: Diagnosis present

## 2014-01-19 HISTORY — DX: Encephalopathy, unspecified: G93.40

## 2014-01-19 HISTORY — DX: Pneumonia, unspecified organism: J18.9

## 2014-01-19 HISTORY — DX: Cerebral infarction, unspecified: I63.9

## 2014-01-19 HISTORY — DX: Pure hypercholesterolemia, unspecified: E78.00

## 2014-01-19 LAB — CBC
HEMATOCRIT: 35.1 % — AB (ref 39.0–52.0)
HEMOGLOBIN: 12.4 g/dL — AB (ref 13.0–17.0)
MCH: 32.6 pg (ref 26.0–34.0)
MCHC: 35.3 g/dL (ref 30.0–36.0)
MCV: 92.4 fL (ref 78.0–100.0)
Platelets: 525 10*3/uL — ABNORMAL HIGH (ref 150–400)
RBC: 3.8 MIL/uL — AB (ref 4.22–5.81)
RDW: 13.3 % (ref 11.5–15.5)
WBC: 21.7 10*3/uL — AB (ref 4.0–10.5)

## 2014-01-19 LAB — COMPREHENSIVE METABOLIC PANEL
ALBUMIN: 2.2 g/dL — AB (ref 3.5–5.2)
ALT: 431 U/L — ABNORMAL HIGH (ref 0–53)
AST: 604 U/L — AB (ref 0–37)
Alkaline Phosphatase: 324 U/L — ABNORMAL HIGH (ref 39–117)
BUN: 57 mg/dL — AB (ref 6–23)
CALCIUM: 9.3 mg/dL (ref 8.4–10.5)
CO2: 22 mEq/L (ref 19–32)
CREATININE: 1.53 mg/dL — AB (ref 0.50–1.35)
Chloride: 101 mEq/L (ref 96–112)
GFR calc Af Amer: 47 mL/min — ABNORMAL LOW (ref >90)
GFR calc non Af Amer: 40 mL/min — ABNORMAL LOW (ref >90)
Glucose, Bld: 150 mg/dL — ABNORMAL HIGH (ref 70–99)
Potassium: 4.3 mEq/L (ref 3.7–5.3)
Sodium: 140 mEq/L (ref 137–147)
TOTAL PROTEIN: 7.4 g/dL (ref 6.0–8.3)
Total Bilirubin: 0.4 mg/dL (ref 0.3–1.2)

## 2014-01-19 LAB — CBG MONITORING, ED: GLUCOSE-CAPILLARY: 128 mg/dL — AB (ref 70–99)

## 2014-01-19 MED ORDER — NALOXONE HCL 0.4 MG/ML IJ SOLN
0.4000 mg | Freq: Once | INTRAMUSCULAR | Status: AC
  Administered 2014-01-19: 0.4 mg via INTRAVENOUS
  Filled 2014-01-19: qty 1

## 2014-01-19 MED ORDER — IOHEXOL 300 MG/ML  SOLN
20.0000 mL | INTRAMUSCULAR | Status: AC
  Administered 2014-01-19: 25 mL via ORAL

## 2014-01-19 NOTE — ED Notes (Signed)
[facility] and reported pt mental status is not normal for him.  Pt was hospitalized with same over one week ago having being released 01/14/14.

## 2014-01-19 NOTE — ED Notes (Signed)
Pt alert and talkative s/p narcan.  When asked if he knew why he was in the ED, pt stated "because she (pointing at his daughter) didn't think I was eating enough."  This appears to be a true statement by daughter as she was attempting to feed him prior to coming to ED and he was not eating well.

## 2014-01-19 NOTE — ED Notes (Signed)
Patient transported to CT 

## 2014-01-19 NOTE — ED Provider Notes (Signed)
CSN: [id number]     Arrival date & time 01/19/14  2013 History   First MD Initiated Contact with Patient 01/19/14 2019     Chief Complaint  Patient presents with  . Altered Mental Status     (Consider location/radiation/quality/duration/timing/severity/associated sxs/prior Treatment) HPI  80 year old male with history of recent stroke, atrial fibrillation, diabetes, from skill nursing facility, hospitalized at [facility] and placed on Eliquis, also recent hospitalization at [facility] from 3/27-3/31 due to respiratory failure likely 2/2 HCAP now is here for evaluation of AMS.  Within the past month, pt has been hospitalized at [facility] for new R MCA ischemic stroke and newly diagnosed afib. After discharged from [facility],   A few days later he experience SOB and AMS and was hospitalized at [facility].  He was released 4 days ago to a nursing rehab facility.  Family reports since he had multiple ribs fracture from the last fall,  he has been given narcotic pain medication.  Daughter notice he appears more altered today, "staring into space, less responsive".  Sts he doesn't seems to be eating much, and they would like for him to be evaluated.  Daughter also notice pt has been having pain to L arm and worry of potential injury from recent fall.  Pt sent here for further evaluation.  Level V caveats applies due to altered mental status.     Past Medical History  Diagnosis Date  . Stroke   . A-fib   . Diabetes mellitus without complication   . Hypertension   . Rib fracture    No past surgical history on file. No family history on file. History  Substance Use Topics  . Smoking status: Not on file  . Smokeless tobacco: Not on file  . Alcohol Use: Not on file    Review of Systems  Unable to perform ROS: Mental status change      Allergies  Review of patient's allergies indicates no known allergies.  Home Medications   Current Outpatient Rx  Name  Route  Sig  Dispense  Refill  .  acetaminophen (TYLENOL) 325 MG tablet   Oral   Take 975 mg by mouth 3 (three) times daily.         Marland Kitchen amLODipine (NORVASC) 10 MG tablet   Oral   Take 10 mg by mouth daily.         Marland Kitchen apixaban (ELIQUIS) 5 MG TABS tablet   Oral   Take 5 mg by mouth 2 (two) times daily.         Marland Kitchen atorvastatin (LIPITOR) 40 MG tablet   Oral   Take 40 mg by mouth daily.         . brinzolamide (AZOPT) 1 % ophthalmic suspension   Both Eyes   Place 1 drop into both eyes 2 (two) times daily.         . Brinzolamide-Brimonidine 1-0.2 % SUSP   Both Eyes   Place 1 drop into both eyes 3 (three) times daily.         . carvedilol (COREG) 12.5 MG tablet   Oral   Take 12.5 mg by mouth 2 (two) times daily with a meal.         . ipratropium-albuterol (DUONEB) 0.5-2.5 (3) MG/3ML SOLN   Nebulization   Take 3 mLs by nebulization every 4 (four) hours as needed.   360 mL   0   . levofloxacin (LEVAQUIN) 500 MG tablet   Oral   Take  1 tablet (500 mg total) by mouth daily.   5 tablet   0   . lidocaine (LIDODERM) 5 %   Transdermal   Place 1 patch onto the skin daily. Remove & Discard patch within 12 hours or as directed by MD         . oxyCODONE (OXY IR/ROXICODONE) 5 MG immediate release tablet   Oral   Take 1 tablet (5 mg total) by mouth every 4 (four) hours as needed for severe pain.   30 tablet   0   . polyethylene glycol (MIRALAX / GLYCOLAX) packet   Oral   Take 17 g by mouth daily.         . sennosides-docusate sodium (SENOKOT-S) 8.6-50 MG tablet   Oral   Take 2 tablets by mouth 2 (two) times daily.         . traMADol (ULTRAM) 50 MG tablet   Oral   Take 25 mg by mouth every 8 (eight) hours.         . travoprost, benzalkonium, (TRAVATAN) 0.004 % ophthalmic solution   Both Eyes   Place 1 drop into both eyes at bedtime.          There were no vitals taken for this visit. Physical Exam  Nursing note and vitals reviewed. Constitutional:  Ill appearing male, drowsy in  appearance, but response to painful stimuli.    HENT:  Head: Normocephalic and atraumatic.  Eyes:  Pinpoint pupils.    Neck: Neck supple.  Cardiovascular: Normal rate and regular rhythm.   Pulmonary/Chest: He exhibits tenderness (diffused anterior chest wall tenderness on palpation.  ).  Decreased lung sounds due to poor effort.  No obvious rales.    Abdominal: Soft. There is no tenderness.  Neurological:  Alert only to self.  Unable to answer any additional questions.      Skin: No rash noted.    ED Course  Procedures (including critical care time)  8:54 PM Pt with R MCA stroke affecting L side here due to increased AMS per family member.  He is currently being treated for HCAP with levaquin, and was discharge from hospital to nursing facility 4 days ago.  Pt has pinpoint pupils and is altered, suspicious of narcotic causing AMS.  Narcan ordered. Has diffused chest wall pain 2/2 recent ribs fx.  No significant pain to L upper arm on palpation and no deformity. Wife worries of possible fx.  Will reimage head CT and CXR.  Care discussed with Dr. Doy Mince.  9:17 PM After receiving Narcan, patient became more alert and able to answer questions appropriately. He does complaining of pain to his left side of body. Apparently pt is receiving Ultram 48m TID and oxycodon as needed.    10:12 PM Patient has generalized abdominal pain on reexamination. Evidence of renal insufficiency with BUN 57, creatinine 1.53. Evidence of transaminitis with AST 604, ALT 431, alkaline phosphatase 324. Leukocytosis with WBC of 21.7. Given this finding will obtain abdominal and pelvis CT scan for further evaluation  12:19 AM CT scan demonstrates mild pneumonia.  Also scattered lucencies along lumbar spine which may need further evaluation to exclude Multiple Myeloma.  No acute changes  Given that pt is currently taking Levaquin and now having transaminitis, i suspect hepato toxicity 2/2 quinolone use.  Will  consult for admission.    12:41 AM I have consulted with [facility], Dr. GAlcario Drought who agrees to admit pt for further care.    Labs Review Labs Reviewed  COMPREHENSIVE METABOLIC PANEL - Abnormal; Notable for the following:    Glucose, Bld 150 (*)    BUN 57 (*)    Creatinine, Ser 1.53 (*)    Albumin 2.2 (*)    AST 604 (*)    ALT 431 (*)    Alkaline Phosphatase 324 (*)    GFR calc non Af Amer 40 (*)    GFR calc Af Amer 47 (*)    All other components within normal limits  CBC - Abnormal; Notable for the following:    WBC 21.7 (*)    RBC 3.80 (*)    Hemoglobin 12.4 (*)    HCT 35.1 (*)    Platelets 525 (*)    All other components within normal limits  CBG MONITORING, ED - Abnormal; Notable for the following:    Glucose-Capillary 128 (*)    All other components within normal limits  BASIC METABOLIC PANEL   Imaging Review Ct Abdomen Pelvis Wo Contrast  01/20/2014   CLINICAL DATA:  Altered mental status.  Loss of appetite.  EXAM: CT ABDOMEN AND PELVIS WITHOUT CONTRAST  TECHNIQUE: Multidetector CT imaging of the abdomen and pelvis was performed following the standard protocol without intravenous contrast.  COMPARISON:  CT of the abdomen and pelvis performed 12/28/2013  FINDINGS: Mild bibasilar airspace opacities, right greater than left, may reflect atelectasis or possibly mild pneumonia.  The liver and spleen are unremarkable in appearance. The gallbladder is within normal limits. The pancreas and adrenal glands are unremarkable.  The kidneys are unremarkable in appearance. There is no evidence of hydronephrosis. No renal or ureteral stones are seen. Mild nonspecific perinephric stranding is noted bilaterally.  No free fluid is identified. The small bowel is unremarkable in appearance. The stomach is within normal limits. No acute vascular abnormalities are seen. Minimal calcification is seen along the right common iliac artery.  The appendix is normal in caliber and contains air,  without evidence for appendicitis. Scattered diverticulosis is noted along the transverse, descending and sigmoid colon. The colon is otherwise unremarkable in appearance.  The bladder is mildly distended and grossly unremarkable. The prostate remains normal in size. No inguinal lymphadenopathy is seen.  No acute osseous abnormalities are identified. Healing left-sided rib fractures are noted. Scattered lucencies are seen along the lumbar spine, of uncertain significance. These are similar in appearance to the recent prior study.  IMPRESSION: 1. Mild bibasilar airspace opacities, right greater than left, may reflect atelectasis or possibly mild pneumonia. 2. Scattered lucencies along the lumbar spine, of uncertain significance. Would consider bone scan for further evaluation, or follow-up with lab values to exclude multiple myeloma. 3. Healing left-sided rib fractures noted. These were first seen on the prior study. 4. Scattered diverticulosis along the transverse, descending and sigmoid colon, without evidence of diverticulitis.   Electronically Signed   By: Garald Balding M.D.   On: 01/20/2014 00:09   Ct Head Wo Contrast  01/19/2014   CLINICAL DATA:  Altered mental status  EXAM: CT HEAD WITHOUT CONTRAST  TECHNIQUE: Contiguous axial images were obtained from the base of the skull through the vertex without intravenous contrast. Study was performed within 24 hr of patient's arrival the emergency.  COMPARISON:  Brain CT January 09, 2014 and brain MRI January 10, 2014  FINDINGS: There is mild diffuse atrophy. There is no mass, hemorrhage, extra-axial fluid collection, or midline shift. There is small vessel disease throughout much of the centra semiovale bilaterally, stable. There are stable known recent infarcts in the  right internal capsule with involvement of portions of the anterior limb, genu, and posterior limbs of the right internal capsule, stable. Small vessel disease is noted in both colonic regions as well as  in the superior right lentiform nucleus, stable. No new infarct is seen compared to recent prior studies.  Bony calvarium appears intact. The mastoid air cells are clear. There is left sphenoid sinus disease, slightly progressed compared to recent prior study.  IMPRESSION: Atrophy with small vessel disease. Recent prior right basal ganglia infarcts appear stable. No new infarct is appreciable compared to recent prior studies. No hemorrhage or mass effect. There has been some progression of left sphenoid sinus disease compared to recent prior study.   Electronically Signed   By: Lowella Grip M.D.   On: 01/19/2014 21:48   Dg Chest Port 1 View  01/19/2014   CLINICAL DATA:  Altered mental status; recent diagnosis of rib fractures.  EXAM: PORTABLE CHEST - 1 VIEW  COMPARISON:  Chest radiograph from 01/09/2014, and CT of the chest performed 12/28/2013  FINDINGS: The lungs are well-aerated. Minimal left-sided atelectasis is seen. There is no evidence of pleural effusion or pneumothorax.  The cardiomediastinal silhouette is mildly enlarged. No acute osseous abnormalities are seen.  IMPRESSION: Mild cardiomegaly; minimal left-sided atelectasis seen. Known left-sided rib fractures are not well characterized on radiograph.   Electronically Signed   By: Garald Balding M.D.   On: 01/19/2014 21:12     EKG Interpretation None      Date: 01/19/2014  Rate:96  Rhythm: atrial fibrillation  QRS Axis: normal  Intervals: normal  ST/T Wave abnormalities: nonspecific T wave changes  Conduction Disutrbances:none  Narrative Interpretation:   Old EKG Reviewed: unchanged    MDM   Final diagnoses:  Transaminitis  Leukocytosis  Altered mental status    BP 164/84  Pulse 98  Temp(Src) 98.2 F (36.8 C) (Oral)  Resp 20  Ht 6' (1.829 m)  Wt 189 lb 1.6 oz (85.775 kg)  BMI 25.64 kg/m2  SpO2 100%  I have reviewed nursing notes and vital signs. I personally reviewed the imaging tests through PACS system  I  reviewed available ER/hospitalization records thought the EMR\     Domenic Moras, PA-C 01/20/14 3200

## 2014-01-19 NOTE — ED Notes (Signed)
Returned from CT.  Pt remains alert and speaking with daughter.  Daughter feels he is mentally himself  now.

## 2014-01-20 ENCOUNTER — Inpatient Hospital Stay (HOSPITAL_COMMUNITY): Payer: PRIVATE HEALTH INSURANCE

## 2014-01-20 ENCOUNTER — Encounter (HOSPITAL_COMMUNITY): Payer: Self-pay | Admitting: *Deleted

## 2014-01-20 DIAGNOSIS — R7401 Elevation of levels of liver transaminase levels: Secondary | ICD-10-CM | POA: Diagnosis present

## 2014-01-20 DIAGNOSIS — R740 Nonspecific elevation of levels of transaminase and lactic acid dehydrogenase [LDH]: Secondary | ICD-10-CM

## 2014-01-20 LAB — CBC
HEMATOCRIT: 36 % — AB (ref 39.0–52.0)
Hemoglobin: 12.4 g/dL — ABNORMAL LOW (ref 13.0–17.0)
MCH: 31.9 pg (ref 26.0–34.0)
MCHC: 34.4 g/dL (ref 30.0–36.0)
MCV: 92.5 fL (ref 78.0–100.0)
PLATELETS: 472 10*3/uL — AB (ref 150–400)
RBC: 3.89 MIL/uL — AB (ref 4.22–5.81)
RDW: 13.2 % (ref 11.5–15.5)
WBC: 16.9 10*3/uL — ABNORMAL HIGH (ref 4.0–10.5)

## 2014-01-20 LAB — URINE MICROSCOPIC-ADD ON

## 2014-01-20 LAB — URINALYSIS, ROUTINE W REFLEX MICROSCOPIC
Bilirubin Urine: NEGATIVE
GLUCOSE, UA: NEGATIVE mg/dL
Hgb urine dipstick: NEGATIVE
Ketones, ur: NEGATIVE mg/dL
LEUKOCYTES UA: NEGATIVE
Nitrite: NEGATIVE
PH: 5.5 (ref 5.0–8.0)
PROTEIN: 30 mg/dL — AB
SPECIFIC GRAVITY, URINE: 1.026 (ref 1.005–1.030)
Urobilinogen, UA: 1 mg/dL (ref 0.0–1.0)

## 2014-01-20 LAB — GLUCOSE, CAPILLARY
GLUCOSE-CAPILLARY: 154 mg/dL — AB (ref 70–99)
Glucose-Capillary: 131 mg/dL — ABNORMAL HIGH (ref 70–99)
Glucose-Capillary: 169 mg/dL — ABNORMAL HIGH (ref 70–99)
Glucose-Capillary: 159 mg/dL — ABNORMAL HIGH (ref 70–99)

## 2014-01-20 LAB — COMPREHENSIVE METABOLIC PANEL
ALT: 396 U/L — ABNORMAL HIGH (ref 0–53)
AST: 479 U/L — AB (ref 0–37)
Albumin: 2.1 g/dL — ABNORMAL LOW (ref 3.5–5.2)
Alkaline Phosphatase: 312 U/L — ABNORMAL HIGH (ref 39–117)
BILIRUBIN TOTAL: 0.4 mg/dL (ref 0.3–1.2)
BUN: 56 mg/dL — AB (ref 6–23)
CALCIUM: 9.6 mg/dL (ref 8.4–10.5)
CO2: 23 meq/L (ref 19–32)
CREATININE: 1.29 mg/dL (ref 0.50–1.35)
Chloride: 103 mEq/L (ref 96–112)
GFR calc Af Amer: 57 mL/min — ABNORMAL LOW (ref >90)
GFR, EST NON AFRICAN AMERICAN: 50 mL/min — AB (ref >90)
Glucose, Bld: 159 mg/dL — ABNORMAL HIGH (ref 70–99)
Potassium: 4.5 mEq/L (ref 3.7–5.3)
Sodium: 143 mEq/L (ref 137–147)
Total Protein: 7 g/dL (ref 6.0–8.3)

## 2014-01-20 LAB — RAPID URINE DRUG SCREEN, HOSP PERFORMED
AMPHETAMINES: NOT DETECTED
BENZODIAZEPINES: NOT DETECTED
Barbiturates: NOT DETECTED
Cocaine: NOT DETECTED
Opiates: NOT DETECTED
Tetrahydrocannabinol: NOT DETECTED

## 2014-01-20 LAB — HEPATITIS PANEL, ACUTE
HCV AB: NEGATIVE
HEP A IGM: NONREACTIVE
Hep B C IgM: NONREACTIVE
Hepatitis B Surface Ag: NEGATIVE

## 2014-01-20 LAB — ACETAMINOPHEN LEVEL

## 2014-01-20 MED ORDER — CARVEDILOL 12.5 MG PO TABS
12.5000 mg | ORAL_TABLET | Freq: Two times a day (BID) | ORAL | Status: DC
  Administered 2014-01-20 – 2014-01-22 (×5): 12.5 mg via ORAL
  Filled 2014-01-20 (×7): qty 1

## 2014-01-20 MED ORDER — HEPARIN SODIUM (PORCINE) 5000 UNIT/ML IJ SOLN: 5000.0000 [IU] | Freq: Three times a day (TID) | INTRAMUSCULAR | Status: DC

## 2014-01-20 MED ORDER — ATORVASTATIN CALCIUM 40 MG PO TABS
40.0000 mg | ORAL_TABLET | Freq: Every day | ORAL | Status: DC
  Administered 2014-01-20 – 2014-01-21 (×2): 40 mg via ORAL
  Filled 2014-01-20 (×4): qty 1

## 2014-01-20 MED ORDER — LATANOPROST 0.005 % OP SOLN
1.0000 [drp] | Freq: Every day | OPHTHALMIC | Status: DC
  Administered 2014-01-20 – 2014-01-21 (×2): 1 [drp] via OPHTHALMIC
  Filled 2014-01-20: qty 2.5

## 2014-01-20 MED ORDER — TRAVOPROST 0.004 % OP SOLN: 1.0000 [drp] | Freq: Every day | OPHTHALMIC | Status: DC

## 2014-01-20 MED ORDER — POLYETHYLENE GLYCOL 3350 17 G PO PACK
17.0000 g | PACK | Freq: Every day | ORAL | Status: DC
  Administered 2014-01-20 – 2014-01-22 (×3): 17 g via ORAL
  Filled 2014-01-20 (×3): qty 1

## 2014-01-20 MED ORDER — LIDOCAINE 5 % EX PTCH
1.0000 | MEDICATED_PATCH | CUTANEOUS | Status: DC
  Administered 2014-01-20 – 2014-01-22 (×3): 1 via TRANSDERMAL
  Filled 2014-01-20 (×3): qty 1

## 2014-01-20 MED ORDER — AMLODIPINE BESYLATE 10 MG PO TABS
10.0000 mg | ORAL_TABLET | Freq: Every day | ORAL | Status: DC
  Administered 2014-01-20 – 2014-01-22 (×3): 10 mg via ORAL
  Filled 2014-01-20 (×3): qty 1

## 2014-01-20 MED ORDER — BRINZOLAMIDE 1 % OP SUSP
1.0000 [drp] | Freq: Two times a day (BID) | OPHTHALMIC | Status: DC
  Administered 2014-01-20 – 2014-01-22 (×5): 1 [drp] via OPHTHALMIC
  Filled 2014-01-20: qty 10

## 2014-01-20 MED ORDER — IPRATROPIUM-ALBUTEROL 0.5-2.5 (3) MG/3ML IN SOLN: 3.0000 mL | RESPIRATORY_TRACT | Status: DC | PRN

## 2014-01-20 MED ORDER — SENNA-DOCUSATE SODIUM 8.6-50 MG PO TABS
2.0000 | ORAL_TABLET | Freq: Two times a day (BID) | ORAL | Status: DC
  Administered 2014-01-20 – 2014-01-22 (×5): 2 via ORAL
  Filled 2014-01-20 (×6): qty 2

## 2014-01-20 MED ORDER — SODIUM CHLORIDE 0.9 % IV SOLN
INTRAVENOUS | Status: DC
  Administered 2014-01-20: 14:00:00 via INTRAVENOUS
  Administered 2014-01-20 – 2014-01-21 (×2): 1000 mL via INTRAVENOUS
  Administered 2014-01-21 (×2): via INTRAVENOUS

## 2014-01-20 MED ORDER — APIXABAN 5 MG PO TABS
5.0000 mg | ORAL_TABLET | Freq: Two times a day (BID) | ORAL | Status: DC
  Administered 2014-01-20 – 2014-01-22 (×5): 5 mg via ORAL
  Filled 2014-01-20 (×6): qty 1

## 2014-01-20 MED ORDER — TRAMADOL HCL 50 MG PO TABS
25.0000 mg | ORAL_TABLET | Freq: Three times a day (TID) | ORAL | Status: DC
  Administered 2014-01-20 – 2014-01-22 (×9): 25 mg via ORAL
  Filled 2014-01-20 (×9): qty 1

## 2014-01-20 NOTE — Progress Notes (Signed)
UR complete.  Katia Hannen RN, MSN 

## 2014-01-20 NOTE — Evaluation (Signed)
Physical Therapy Evaluation Patient Details Name: Joel [name] MRN: [id number] DOB: [birth year] Today's Date: 01/20/2014   History of Present Illness  80 year old male with past medical history of recent CVA (right MCA stroke), hospitalized at [facility], was found to be in atrial fibrillation and is now on Eliquis for anticoagulation who presented to [facility] ED 01/09/2014 with complaints of increased lethargy and confusion as noted by his family for past 24 hours. Patient is from nursing home and per SNF report patient had a fever there of 101.8 F and his oxygen saturation was 81%. There was no respiratory distress. No complaints of chest pain, palpitations of shortness of breath. No cough. No abdominal pain, no nausea or vomiting. No diarrhea and no blood in stool or urine.    Clinical Impression  Patient demonstrates deficits in mobility as indicated. Will need continued PT to address deficits and maximize function. Will see as indicated and progress as tolerated. Recommend return to SNF for rehab.     Follow Up Recommendations SNF    Equipment Recommendations  None recommended by PT    Recommendations for Other Services       Precautions / Restrictions Precautions Precautions: Fall Restrictions Weight Bearing Restrictions: No      Mobility  Bed Mobility Overal bed mobility: Needs Assistance Bed Mobility: Rolling;Supine to Sit;Sit to Supine Rolling: Max assist   Supine to sit: Max assist Sit to supine: Max assist   General bed mobility comments: Max assist using chuck pad for mobility, patient able to initiate RUE movement using hand rail, extreme pain noted with mobility as patient crying out in pain.  Transfers                    Ambulation/Gait                Stairs            Wheelchair Mobility    Modified Rankin (Stroke Patients Only) Modified Rankin (Stroke Patients Only) Pre-Morbid Rankin Score: Severe disability Modified Rankin: Severe  disability     Balance Overall balance assessment: Needs assistance Sitting-balance support: Single extremity supported;Feet supported Sitting balance-Leahy Scale: Poor Sitting balance - Comments: Patient with heavy LUE lean, with posterior bias, unable to come off left elbow support. Maximal assist to approach midline but unable to withstand pain and position without assist. Postural control: Posterior lean;Left lateral lean                                   Pertinent Vitals/Pain No number provided, but complains of significant pain in bilateral Knees, heels, and LUE    Home Living Family/patient expects to be discharged to:: Skilled nursing facility                 Additional Comments: pt was at [facility].     Prior Function Level of Independence: Needs assistance   Gait / Transfers Assistance Needed: began working on ambulation at rehab otherwise used w/c  ADL's / Homemaking Assistance Needed: assist for rehab staff for dressing/bathing. pt able to feed self with R hand        Hand Dominance   Dominant Hand: Right    Extremity/Trunk Assessment   Upper Extremity Assessment: Defer to OT evaluation (poor use of LUE)           Lower Extremity Assessment: Generalized weakness;RLE deficits/detail;LLE deficits/detail  LLE Deficits / Details: spastic/tight position at rest, responds with movement, minimal ADD and ER. unable to produce any other movements  Cervical / Trunk Assessment: Kyphotic  Communication   Communication: No difficulties  Cognition Arousal/Alertness: Awake/alert Behavior During Therapy: WFL for tasks assessed/performed Overall Cognitive Status: Within Functional Limits for tasks assessed                      General Comments General comments (skin integrity, edema, etc.): Increased sensativity to touch bilateral LEs, heels, and knees. Able to tolerated pain with some movement but unable to take and  weight shift through RLE at this time.    Exercises General Exercises - Lower Extremity Ankle Circles/Pumps: PROM;5 reps;Supine Heel Slides: PROM (used to relieve spasticity prior to positioning)      Assessment/Plan    PT Assessment Patient needs continued PT services  PT Diagnosis Generalized weakness;Hemiplegia non-dominant side   PT Problem List Decreased strength;Decreased range of motion;Decreased activity tolerance;Decreased balance;Decreased mobility;Decreased coordination;Decreased knowledge of use of DME;Decreased safety awareness;Pain  PT Treatment Interventions Gait training;Stair training;DME instruction;Functional mobility training;Therapeutic activities;Therapeutic exercise;Balance training;Neuromuscular re-education;Patient/family education   PT Goals (Current goals can be found in the Care Plan section) Acute Rehab PT Goals Patient Stated Goal: none stated PT Goal Formulation: With patient Time For Goal Achievement: 02/03/14 Potential to Achieve Goals: Fair    Frequency Min 3X/week   Barriers to discharge        Co-evaluation               End of Session Equipment Utilized During Treatment: Gait belt;Oxygen (3L O2 on wall) Activity Tolerance: Patient limited by fatigue;Patient limited by pain Patient left: in bed;with call bell/phone within reach;with bed alarm set Nurse Communication: Mobility status ( need for a lift pad for transfers)         Time: [id number] PT Time Calculation (min): 21 min   Charges:   PT Evaluation $Initial PT Evaluation Tier I: 1 Procedure PT Treatments $Therapeutic Activity: 8-22 mins   PT G CodesFabio [name]:          Joel [name] J 01/20/2014, 3:29 PM Joel [name], PT DPT  [phone]

## 2014-01-20 NOTE — ED Notes (Signed)
[facility].

## 2014-01-20 NOTE — H&P (Signed)
[facility] History and Physical  Joel [name] DTO:[id number] DOB: [birth year] DOA: 01/19/2014  Referring physician: EDP PCP: No PCP Per Patient   Chief Complaint: AMS   HPI: Joel [name] is a 80 y.o. male who presents to the ED with AMS.  He was recently admitted to our service for HCAP and discharged home on levaquin.  AMS onset earlier today, he is "staring into space, less responsive".  Per daughter also not eating or drinking much.  He has been on narcotics for rib pain from a fall recently.  His AMS resolved in ED after being given narcan.  Review of Systems: Systems reviewed.  As above, otherwise negative  Past Medical History  Diagnosis Date  . Stroke   . A-fib   . Diabetes mellitus without complication   . Hypertension   . Rib fracture   . CVA (cerebral infarction)   . Encephalopathy   . Pneumonia   . Hypercholesterolemia    History reviewed. No pertinent past surgical history. Social History:  reports that he does not drink alcohol. His tobacco and drug histories are not on file.  No Known Allergies  History reviewed. No pertinent family history.   Prior to Admission medications   Medication Sig Start Date End Date Taking? Authorizing Provider  acetaminophen (TYLENOL) 325 MG tablet Take 975 mg by mouth 3 (three) times daily.   Yes Historical Provider, MD  amLODipine (NORVASC) 10 MG tablet Take 10 mg by mouth daily.   Yes Historical Provider, MD  apixaban (ELIQUIS) 5 MG TABS tablet Take 5 mg by mouth 2 (two) times daily.   Yes Historical Provider, MD  atorvastatin (LIPITOR) 40 MG tablet Take 40 mg by mouth at bedtime.    Yes Historical Provider, MD  brinzolamide (AZOPT) 1 % ophthalmic suspension Place 1 drop into both eyes 2 (two) times daily.   Yes Historical Provider, MD  Brinzolamide-Brimonidine 1-0.2 % SUSP Place 1 drop into both eyes 3 (three) times daily.   Yes Historical Provider, MD  carvedilol (COREG) 12.5 MG tablet Take 12.5 mg by mouth 2 (two) times  daily with a meal.   Yes Historical Provider, MD  ipratropium-albuterol (DUONEB) 0.5-2.5 (3) MG/3ML SOLN Take 3 mLs by nebulization every 4 (four) hours as needed. 01/14/14  Yes Robbie Lis, MD  lidocaine (LIDODERM) 5 % Place 1 patch onto the skin daily. Remove & Discard patch within 12 hours or as directed by MD   Yes Historical Provider, MD  oxyCODONE (OXY IR/ROXICODONE) 5 MG immediate release tablet Take 1 tablet (5 mg total) by mouth every 4 (four) hours as needed for severe pain. 01/14/14  Yes Robbie Lis, MD  polyethylene glycol [facility] / Floria Raveling) packet Take 17 g by mouth daily.   Yes Historical Provider, MD  sennosides-docusate sodium (SENOKOT-S) 8.6-50 MG tablet Take 2 tablets by mouth 2 (two) times daily.   Yes Historical Provider, MD  traMADol (ULTRAM) 50 MG tablet Take 25 mg by mouth every 8 (eight) hours.   Yes Historical Provider, MD  travoprost, benzalkonium, (TRAVATAN) 0.004 % ophthalmic solution Place 1 drop into both eyes at bedtime.   Yes Historical Provider, MD   Physical Exam: Filed Vitals:   01/20/14 0030  BP: 141/73  Pulse: 92  Temp:   Resp: 26    BP 141/73  Pulse 92  Temp(Src) 98.2 F (36.8 C) (Oral)  Resp 26  Ht 6' (1.829 m)  Wt 85.775 kg (189 lb 1.6 oz)  BMI 25.64 kg/m2  SpO2  100%  General Appearance:    Alert, oriented, no distress, appears stated age  Head:    Normocephalic, atraumatic  Eyes:    PERRL, EOMI, sclera non-icteric        Nose:   Nares without drainage or epistaxis. Mucosa, turbinates normal  Throat:   Moist mucous membranes. Oropharynx without erythema or exudate.  Neck:   Supple. No carotid bruits.  No thyromegaly.  No lymphadenopathy.   Back:     No CVA tenderness, no spinal tenderness  Lungs:     Clear to auscultation bilaterally, without wheezes, rhonchi or rales  Chest wall:    No tenderness to palpitation  Heart:    Regular rate and rhythm without murmurs, gallops, rubs  Abdomen:     Soft, non-tender, nondistended, normal bowel  sounds, no organomegaly  Genitalia:    deferred  Rectal:    deferred  Extremities:   Severe pain with slight movement of either lower extremity.  Pulses:   2+ and symmetric all extremities  Skin:   Skin color, texture, turgor normal, no rashes or lesions  Lymph nodes:   Cervical, supraclavicular, and axillary nodes normal  Neurologic:   CNII-XII intact. Normal strength, sensation and reflexes      throughout    Labs on Admission:  Basic Metabolic Panel:  Recent Labs Lab 01/19/14 2035  NA 140  K 4.3  CL 101  CO2 22  GLUCOSE 150*  BUN 57*  CREATININE 1.53*  CALCIUM 9.3   Liver Function Tests:  Recent Labs Lab 01/19/14 2035  AST 604*  ALT 431*  ALKPHOS 324*  BILITOT 0.4  PROT 7.4  ALBUMIN 2.2*   No results found for this basename: LIPASE, AMYLASE,  in the last 168 hours No results found for this basename: AMMONIA,  in the last 168 hours CBC:  Recent Labs Lab 01/19/14 2035  WBC 21.7*  HGB 12.4*  HCT 35.1*  MCV 92.4  PLT 525*   Cardiac Enzymes: No results found for this basename: CKTOTAL, CKMB, CKMBINDEX, TROPONINI,  in the last 168 hours  BNP (last 3 results) No results found for this basename: PROBNP,  in the last 8760 hours CBG:  Recent Labs Lab 01/13/14 0735 01/19/14 2112  GLUCAP 130* 128*    Radiological Exams on Admission: Ct Abdomen Pelvis Wo Contrast  01/20/2014   CLINICAL DATA:  Altered mental status.  Loss of appetite.  EXAM: CT ABDOMEN AND PELVIS WITHOUT CONTRAST  TECHNIQUE: Multidetector CT imaging of the abdomen and pelvis was performed following the standard protocol without intravenous contrast.  COMPARISON:  CT of the abdomen and pelvis performed 12/28/2013  FINDINGS: Mild bibasilar airspace opacities, right greater than left, may reflect atelectasis or possibly mild pneumonia.  The liver and spleen are unremarkable in appearance. The gallbladder is within normal limits. The pancreas and adrenal glands are unremarkable.  The kidneys are  unremarkable in appearance. There is no evidence of hydronephrosis. No renal or ureteral stones are seen. Mild nonspecific perinephric stranding is noted bilaterally.  No free fluid is identified. The small bowel is unremarkable in appearance. The stomach is within normal limits. No acute vascular abnormalities are seen. Minimal calcification is seen along the right common iliac artery.  The appendix is normal in caliber and contains air, without evidence for appendicitis. Scattered diverticulosis is noted along the transverse, descending and sigmoid colon. The colon is otherwise unremarkable in appearance.  The bladder is mildly distended and grossly unremarkable. The prostate remains normal in size. No  inguinal lymphadenopathy is seen.  No acute osseous abnormalities are identified. Healing left-sided rib fractures are noted. Scattered lucencies are seen along the lumbar spine, of uncertain significance. These are similar in appearance to the recent prior study.  IMPRESSION: 1. Mild bibasilar airspace opacities, right greater than left, may reflect atelectasis or possibly mild pneumonia. 2. Scattered lucencies along the lumbar spine, of uncertain significance. Would consider bone scan for further evaluation, or follow-up with lab values to exclude multiple myeloma. 3. Healing left-sided rib fractures noted. These were first seen on the prior study. 4. Scattered diverticulosis along the transverse, descending and sigmoid colon, without evidence of diverticulitis.   Electronically Signed   By: Garald Balding M.D.   On: 01/20/2014 00:09   Ct Head Wo Contrast  01/19/2014   CLINICAL DATA:  Altered mental status  EXAM: CT HEAD WITHOUT CONTRAST  TECHNIQUE: Contiguous axial images were obtained from the base of the skull through the vertex without intravenous contrast. Study was performed within 24 hr of patient's arrival the emergency.  COMPARISON:  Brain CT January 09, 2014 and brain MRI January 10, 2014  FINDINGS: There  is mild diffuse atrophy. There is no mass, hemorrhage, extra-axial fluid collection, or midline shift. There is small vessel disease throughout much of the centra semiovale bilaterally, stable. There are stable known recent infarcts in the right internal capsule with involvement of portions of the anterior limb, genu, and posterior limbs of the right internal capsule, stable. Small vessel disease is noted in both colonic regions as well as in the superior right lentiform nucleus, stable. No new infarct is seen compared to recent prior studies.  Bony calvarium appears intact. The mastoid air cells are clear. There is left sphenoid sinus disease, slightly progressed compared to recent prior study.  IMPRESSION: Atrophy with small vessel disease. Recent prior right basal ganglia infarcts appear stable. No new infarct is appreciable compared to recent prior studies. No hemorrhage or mass effect. There has been some progression of left sphenoid sinus disease compared to recent prior study.   Electronically Signed   By: Lowella Grip M.D.   On: 01/19/2014 21:48   Dg Chest Port 1 View  01/19/2014   CLINICAL DATA:  Altered mental status; recent diagnosis of rib fractures.  EXAM: PORTABLE CHEST - 1 VIEW  COMPARISON:  Chest radiograph from 01/09/2014, and CT of the chest performed 12/28/2013  FINDINGS: The lungs are well-aerated. Minimal left-sided atelectasis is seen. There is no evidence of pleural effusion or pneumothorax.  The cardiomediastinal silhouette is mildly enlarged. No acute osseous abnormalities are seen.  IMPRESSION: Mild cardiomegaly; minimal left-sided atelectasis seen. Known left-sided rib fractures are not well characterized on radiograph.   Electronically Signed   By: Garald Balding M.D.   On: 01/19/2014 21:12    EKG: Independently reviewed.  Assessment/Plan Principal Problem:   Transaminitis Active Problems:   Acute encephalopathy   Atrial fibrillation   HTN (hypertension)    Leukocytosis   1. Acute encephalopathy - resolved with narcan, cautious use of PRN opiods in furture, will hold these for now. 2. Transaminitis - possibly due to levaquin toxicity, repeat CMP in AM, patient just finished course of levaquin so will stop this. 3. Leukocytosis - no other signs or symptoms of infection at this point, will monitor with repeat CBC in AM, possibly due to recent HCAP. 4. A.Fib - continue elliquis 5. BLE pain with movement - ordering MRI L spine to see if there is any nerve impingement, due  to this pain he has not been working at all with PT or OT.  No evidence of fracture on CT scan abd/pelvis.    Code Status: Full Code  Family Communication: Daughter at bedside Disposition Plan: Admit to inpatient   Time spent: 68 min  Caliber Landess M. [facility] Pager [phone]  If 7AM-7PM, please contact the day team taking care of the patient [email or web address] Password [email or web address] 01/20/2014, 1:06 AM

## 2014-01-20 NOTE — Progress Notes (Addendum)
[facility]. Room ready and awaiting arrival of pt to unit. [facility]. Pt admitted under med-surg, not telemetry bed. Tele will not be placed. Will continue to monitor.

## 2014-01-20 NOTE — Progress Notes (Signed)
[facility] PROGRESS NOTE  Joel [name] NIO:[id number] DOB: 1927-11-18 DOA: 01/19/2014 PCP: No PCP Per Patient  Assessment/Plan: 1. Acute encephalopathy - resolved with narcan, cautious use of PRN opiods in furture 2. Transaminitis - possibly due to levaquin toxicity, repeat CMP in AM, patient just finished course of levaquin so will stop this. Will follow acute hepatitis panel. LFT's trending down this AM. 3. Leukocytosis - no other signs or symptoms of infection at this point, will monitor. WBC trending down today. Possibly due to recent HCAP. 4. A.Fib - Currently rate controlled. continue elliquis 5. BLE pain with movement - MRI L spine without nerve impingement. No evidence of fracture on CT scan abd/pelvis. Cont PT/OT  Code Status: Full Family Communication: Pt in room (indicate person spoken with, relationship, and if by phone, the number) Disposition Plan: Pending  Consultants:    Procedures:    Antibiotics:    HPI/Subjective: No acute events noted overnight.  Objective: Filed Vitals:   01/20/14 0030 01/20/14 0100 01/20/14 0152 01/20/14 0541  BP: 141/73 144/91 147/104 142/65  Pulse: 92 67 91 88  Temp:   97.7 F (36.5 C) 97.7 F (36.5 C)  TempSrc:   Oral Oral  Resp: 26 26 18 18   Height:   5' 11"  (1.803 m)   Weight:   80.786 kg (178 lb 1.6 oz)   SpO2: 100% 100% 100% 100%    Intake/Output Summary (Last 24 hours) at 01/20/14 1007 Last data filed at 01/20/14 0500  Gross per 24 hour  Intake      3 ml  Output      0 ml  Net      3 ml   Filed Weights   01/19/14 2023 01/20/14 0152  Weight: 85.775 kg (189 lb 1.6 oz) 80.786 kg (178 lb 1.6 oz)    Exam:  General:  Asleep, in nad  Cardiovascular: regular, s1, s2  Respiratory: normal resp effort, no wheezing  Abdomen: soft, nondistended  Musculoskeletal: perfused, no clubbing   Data Reviewed: Basic Metabolic Panel:  Recent Labs Lab 01/19/14 2035  NA 140  K 4.3  CL 101  CO2 22  GLUCOSE 150*   BUN 57*  CREATININE 1.53*  CALCIUM 9.3   Liver Function Tests:  Recent Labs Lab 01/19/14 2035  AST 604*  ALT 431*  ALKPHOS 324*  BILITOT 0.4  PROT 7.4  ALBUMIN 2.2*   No results found for this basename: LIPASE, AMYLASE,  in the last 168 hours No results found for this basename: AMMONIA,  in the last 168 hours CBC:  Recent Labs Lab 01/19/14 2035  WBC 21.7*  HGB 12.4*  HCT 35.1*  MCV 92.4  PLT 525*   Cardiac Enzymes: No results found for this basename: CKTOTAL, CKMB, CKMBINDEX, TROPONINI,  in the last 168 hours BNP (last 3 results) No results found for this basename: PROBNP,  in the last 8760 hours CBG:  Recent Labs Lab 01/19/14 2112 01/20/14 0706  GLUCAP 128* 131*    Recent Results (from the past 240 hour(s))  URINE CULTURE     Status: None   Collection Time    01/11/14 12:07 AM      Result Value Ref Range Status   Specimen Description URINE, RANDOM   Final   Special Requests NONE   Final   Culture  Setup Time     Final   Value: 01/11/2014 13:40     Performed at [facility]     Final  Value: NO GROWTH     Performed at [facility]   Culture     Final   Value: NO GROWTH     Performed at [facility]   Report Status 01/12/2014 FINAL   Final     Studies: Ct Abdomen Pelvis Wo Contrast  01/20/2014   CLINICAL DATA:  Altered mental status.  Loss of appetite.  EXAM: CT ABDOMEN AND PELVIS WITHOUT CONTRAST  TECHNIQUE: Multidetector CT imaging of the abdomen and pelvis was performed following the standard protocol without intravenous contrast.  COMPARISON:  CT of the abdomen and pelvis performed 12/28/2013  FINDINGS: Mild bibasilar airspace opacities, right greater than left, may reflect atelectasis or possibly mild pneumonia.  The liver and spleen are unremarkable in appearance. The gallbladder is within normal limits. The pancreas and adrenal glands are unremarkable.  The kidneys are unremarkable in appearance. There is no  evidence of hydronephrosis. No renal or ureteral stones are seen. Mild nonspecific perinephric stranding is noted bilaterally.  No free fluid is identified. The small bowel is unremarkable in appearance. The stomach is within normal limits. No acute vascular abnormalities are seen. Minimal calcification is seen along the right common iliac artery.  The appendix is normal in caliber and contains air, without evidence for appendicitis. Scattered diverticulosis is noted along the transverse, descending and sigmoid colon. The colon is otherwise unremarkable in appearance.  The bladder is mildly distended and grossly unremarkable. The prostate remains normal in size. No inguinal lymphadenopathy is seen.  No acute osseous abnormalities are identified. Healing left-sided rib fractures are noted. Scattered lucencies are seen along the lumbar spine, of uncertain significance. These are similar in appearance to the recent prior study.  IMPRESSION: 1. Mild bibasilar airspace opacities, right greater than left, may reflect atelectasis or possibly mild pneumonia. 2. Scattered lucencies along the lumbar spine, of uncertain significance. Would consider bone scan for further evaluation, or follow-up with lab values to exclude multiple myeloma. 3. Healing left-sided rib fractures noted. These were first seen on the prior study. 4. Scattered diverticulosis along the transverse, descending and sigmoid colon, without evidence of diverticulitis.   Electronically Signed   By: Garald Balding M.D.   On: 01/20/2014 00:09   Ct Head Wo Contrast  01/19/2014   CLINICAL DATA:  Altered mental status  EXAM: CT HEAD WITHOUT CONTRAST  TECHNIQUE: Contiguous axial images were obtained from the base of the skull through the vertex without intravenous contrast. Study was performed within 24 hr of patient's arrival the emergency.  COMPARISON:  Brain CT January 09, 2014 and brain MRI January 10, 2014  FINDINGS: There is mild diffuse atrophy. There is no  mass, hemorrhage, extra-axial fluid collection, or midline shift. There is small vessel disease throughout much of the centra semiovale bilaterally, stable. There are stable known recent infarcts in the right internal capsule with involvement of portions of the anterior limb, genu, and posterior limbs of the right internal capsule, stable. Small vessel disease is noted in both colonic regions as well as in the superior right lentiform nucleus, stable. No new infarct is seen compared to recent prior studies.  Bony calvarium appears intact. The mastoid air cells are clear. There is left sphenoid sinus disease, slightly progressed compared to recent prior study.  IMPRESSION: Atrophy with small vessel disease. Recent prior right basal ganglia infarcts appear stable. No new infarct is appreciable compared to recent prior studies. No hemorrhage or mass effect. There has been some progression of left sphenoid sinus  disease compared to recent prior study.   Electronically Signed   By: Lowella Grip M.D.   On: 01/19/2014 21:48   Mr Lumbar Spine Wo Contrast  01/20/2014   CLINICAL DATA:  Bilateral back and leg pain.  EXAM: MRI LUMBAR SPINE WITHOUT CONTRAST  TECHNIQUE: Multiplanar, multisequence MR imaging was performed. No intravenous contrast was administered.  COMPARISON:  CT abdomen and pelvis 01/19/2014  FINDINGS: There is straightening of the normal lumbar lordosis. There is no listhesis. Bone marrow signal is heterogeneous diffusely. Small Schmorl's nodes are present throughout the lumbar spine with adjacent degenerative marrow changes. Multilevel disc desiccation is present. There is mild disc space narrowing at multiple levels throughout the lumbar spine. The conus medullaris is normal in signal and terminates at the superior aspect of L2. Mildly prominent ventral epidural soft tissues at multiple levels may reflect mild venous engorgement. The visualized paraspinal soft tissues are unremarkable.  T11-12: Only  imaged sagittally. Slight disc bulge and mild facet hypertrophy result in mild right neural foraminal narrowing.  T12-L1: Only imaged sagittally. Slight disc bulge and facet hypertrophy result in mild right neural foraminal narrowing.  L1-2: Mild disc bulge and right greater than left facet hypertrophy result in moderate right neural foraminal stenosis without spinal canal stenosis.  L2-3: Mild disc bulge and facet hypertrophy result in mild bilateral neural foraminal narrowing without spinal canal stenosis.  L3-4:  Mild disc bulge and facet hypertrophy without stenosis.  L4-5: Mild disc bulge and facet hypertrophy result in mild to moderate bilateral neural foraminal stenosis. No spinal canal stenosis.  L5-S1: Mild disc bulge and right greater than left facet hypertrophy result in mild to moderate bilateral neural foraminal stenosis. No spinal canal stenosis.  IMPRESSION: 1. Mild-to-moderate multilevel degenerative disc disease and facet arthrosis resulting in mild to moderate neural foraminal stenosis as above, greatest at L4-5 and L5-S1. No spinal canal stenosis. 2. Heterogeneous bone marrow signal throughout the lumbar spine. Much of this is secondary to Schmorl's nodes and adjacent degenerative marrow changes, although an infiltrative process such as multiple myeloma cannot be excluded.   Electronically Signed   By: Logan Bores   On: 01/20/2014 09:03   Dg Chest Port 1 View  01/19/2014   CLINICAL DATA:  Altered mental status; recent diagnosis of rib fractures.  EXAM: PORTABLE CHEST - 1 VIEW  COMPARISON:  Chest radiograph from 01/09/2014, and CT of the chest performed 12/28/2013  FINDINGS: The lungs are well-aerated. Minimal left-sided atelectasis is seen. There is no evidence of pleural effusion or pneumothorax.  The cardiomediastinal silhouette is mildly enlarged. No acute osseous abnormalities are seen.  IMPRESSION: Mild cardiomegaly; minimal left-sided atelectasis seen. Known left-sided rib fractures are  not well characterized on radiograph.   Electronically Signed   By: Garald Balding M.D.   On: 01/19/2014 21:12    Scheduled Meds: . amLODipine  10 mg Oral Daily  . apixaban  5 mg Oral BID  . atorvastatin  40 mg Oral QHS  . brinzolamide  1 drop Both Eyes BID  . carvedilol  12.5 mg Oral BID WC  . latanoprost  1 drop Both Eyes QHS  . lidocaine  1 patch Transdermal Q24H  . polyethylene glycol  17 g Oral Daily  . sennosides-docusate sodium  2 tablet Oral BID  . traMADol  25 mg Oral 3 times per day   Continuous Infusions: . sodium chloride 1,000 mL (01/20/14 0319)    Principal Problem:   Transaminitis Active Problems:   Acute encephalopathy  Atrial fibrillation   HTN (hypertension)   Leukocytosis  Time spent: 11mn  Jerral Mccauley, [facility] Pager [phone] If 7PM-7AM, please contact night-coverage at [email or web address], password [email or web address]4/03/2014, 10:07 AM  LOS: 1 day

## 2014-01-21 ENCOUNTER — Inpatient Hospital Stay (HOSPITAL_COMMUNITY): Payer: PRIVATE HEALTH INSURANCE

## 2014-01-21 DIAGNOSIS — R4182 Altered mental status, unspecified: Secondary | ICD-10-CM

## 2014-01-21 DIAGNOSIS — D72829 Elevated white blood cell count, unspecified: Secondary | ICD-10-CM

## 2014-01-21 DIAGNOSIS — R7402 Elevation of levels of lactic acid dehydrogenase (LDH): Secondary | ICD-10-CM

## 2014-01-21 DIAGNOSIS — I10 Essential (primary) hypertension: Secondary | ICD-10-CM

## 2014-01-21 DIAGNOSIS — R740 Nonspecific elevation of levels of transaminase and lactic acid dehydrogenase [LDH]: Secondary | ICD-10-CM

## 2014-01-21 LAB — COMPREHENSIVE METABOLIC PANEL
ALT: 405 U/L — AB (ref 0–53)
AST: 550 U/L — ABNORMAL HIGH (ref 0–37)
Albumin: 1.8 g/dL — ABNORMAL LOW (ref 3.5–5.2)
Alkaline Phosphatase: 313 U/L — ABNORMAL HIGH (ref 39–117)
BUN: 44 mg/dL — ABNORMAL HIGH (ref 6–23)
CO2: 22 mEq/L (ref 19–32)
Calcium: 9 mg/dL (ref 8.4–10.5)
Chloride: 108 mEq/L (ref 96–112)
Creatinine, Ser: 1.05 mg/dL (ref 0.50–1.35)
GFR calc non Af Amer: 64 mL/min — ABNORMAL LOW (ref >90)
GFR, EST AFRICAN AMERICAN: 74 mL/min — AB (ref >90)
GLUCOSE: 126 mg/dL — AB (ref 70–99)
Potassium: 4.5 mEq/L (ref 3.7–5.3)
SODIUM: 144 meq/L (ref 137–147)
TOTAL PROTEIN: 6.3 g/dL (ref 6.0–8.3)
Total Bilirubin: 0.5 mg/dL (ref 0.3–1.2)

## 2014-01-21 LAB — URINE CULTURE
Colony Count: NO GROWTH
Culture: NO GROWTH

## 2014-01-21 LAB — GLUCOSE, CAPILLARY
GLUCOSE-CAPILLARY: 124 mg/dL — AB (ref 70–99)
GLUCOSE-CAPILLARY: 184 mg/dL — AB (ref 70–99)
Glucose-Capillary: 142 mg/dL — ABNORMAL HIGH (ref 70–99)
Glucose-Capillary: 126 mg/dL — ABNORMAL HIGH (ref 70–99)

## 2014-01-21 MED ORDER — INDOMETHACIN 50 MG PO CAPS
50.0000 mg | ORAL_CAPSULE | Freq: Three times a day (TID) | ORAL | Status: DC | PRN
  Administered 2014-01-22: 50 mg via ORAL
  Filled 2014-01-21 (×2): qty 1

## 2014-01-21 NOTE — ED Provider Notes (Signed)
Medical screening examination/treatment/procedure(s) were conducted as a shared visit with non-physician practitioner(s) and myself.  I personally evaluated the patient during the encounter.   Please see my separate note.     Candyce ChurnJohn David Kyre Jeffries III, MD 01/21/14 1024

## 2014-01-21 NOTE — Clinical Social Work Placement (Addendum)
Clinical Social Work Department CLINICAL SOCIAL WORK PLACEMENT NOTE 01/21/2014  Patient:  Joel [name],Joel [name]  Account Number:  [id number] Admit date:  01/19/2014  Clinical Social Worker:  Irving BurtonEMILY SUMMERVILLE, LCSWA  Date/time:  01/21/2014 01:19 PM  Clinical Social Work is seeking post-discharge placement for this patient at the following level of care:   SKILLED NURSING   (*CSW will update this form in Epic as items are completed)   01/21/2014  [facility]family provided with [facility] Works list of facilities offering this level of care within the geographic area requested by the patient (or if unable, by the patients family).  01/21/2014  Patient/family informed of their freedom to choose among providers that offer the needed level of care, that participate in Medicare, Medicaid or managed care program needed by the patient, have an available bed and are willing to accept the patient.  01/21/2014  Patient/family informed of [facility] ownership interest in [facility], as well as of the fact that they are under no obligation to receive care at this facility.  PASARR submitted to EDS on  PASARR number received from EDS on   FL2 transmitted to all facilities in geographic area requested by pt/family on  01/21/2014 FL2 transmitted to all facilities within larger geographic area on   Patient informed that his/her managed care company has contracts with or will negotiate with  certain facilities, including the following:     Patient/family informed of bed offers received:  01/22/2014 Patient chooses bed at  Physician recommends and patient chooses bed at    Patient to be transferred to  on   Patient to be transferred to facility by   The following physician request were entered in Epic:   Additional Comments: PASARR already exisiting.  Darlyn ChamberEmily Summerville, LCSWA Clinical Social Worker [phone]

## 2014-01-21 NOTE — Clinical Social Work Psychosocial (Signed)
Clinical Social Work Department BRIEF PSYCHOSOCIAL ASSESSMENT 01/21/2014  Patient:  Joel [name], Joel [name]     Account Number:  [id number]     Admit date:  01/19/2014  Clinical Social Worker:  Donna Christen  Date/Time:  01/21/2014 01:05 PM  Referred by:  Physician  Date Referred:  01/21/2014 Referred for  SNF Placement   Other Referral:   none.   Interview type:  Patient Other interview type:   CSW also spoke with pt's daughter, Joel [name], as pt was unable to inform CSW the name of his SNF.    PSYCHOSOCIAL DATA Living Status:  FACILITY Admitted from facility:  [facility] Level of care:  [place] Primary support name:  Joel [name] Primary support relationship to patient:  CHILD, ADULT Degree of support available:   Strong support system, pt's daughter is an advocate for pt's care.    CURRENT CONCERNS Current Concerns  Post-Acute Placement   Other Concerns:   none.    SOCIAL WORK ASSESSMENT / PLAN CSW received consult for SNF placement. Per chart, pt was admitted from a SNF [facility]) prior to admission to [facility]. CSW met with pt at bedside. Pt was unable to recall the facility name from which he came, but granted CSW permission to speak with his daughter Joel [name]).    CSW contacted pt's daughter, Joel [name], who informed CSW that pt is from [place]. Per Joel [name], family would like for CSW to complete a new SNF search as family is not pleased with pt's care at [facility]. CSW to continue to follow and assist with discharge planning needs.   Assessment/plan status:  Psychosocial Support/Ongoing Assessment of Needs Other assessment/ plan:   none.   Information/referral to community resources:   [facility] bed offers.    PATIENTS/FAMILYS RESPONSE TO PLAN OF CARE: Pt's daughter is understanding and agreeable to new SNF search. Pt's daughter expressed concern for pt's care at previous SNF. CSW offered support to pt's daughter.        Joel [name], [place] Social Worker [phone]

## 2014-01-21 NOTE — Evaluation (Signed)
Occupational Therapy Evaluation Patient Details Name: Joel [name] MRN: [id number] DOB: [birth year] Today's Date: 01/21/2014    History of Present Illness 79 year old male with past medical history of recent CVA (right MCA stroke), hospitalized at [facility], was found to be in atrial fibrillation and is now on Eliquis for anticoagulation who presented to [facility] ED 01/09/2014 with complaints of increased lethargy and confusion as noted by his family for past 24 hours. Patient is from nursing home and per SNF report patient had a fever there of 101.8 F and his oxygen saturation was 81%. There was no respiratory distress. No complaints of chest pain, palpitations of shortness of breath. No cough. No abdominal pain, no nausea or vomiting. No diarrhea and no blood in stool or urine.     Clinical Impression   Pt requires extensive assist with ADLs and ADL mobility (2 person assist) at this time. PTA, pt at SNF for rehab following CVA. No further acute OT services indicated at this time, will defer further OT intervention to SNF    Follow Up Recommendations  Supervision/Assistance - 24 hour    Equipment Recommendations  None recommended by OT;Other (comment) (TBD at next venue of care)    Recommendations for Other Services       Precautions / Restrictions Precautions Precautions: Fall Restrictions Weight Bearing Restrictions: No      Mobility Bed Mobility Overal bed mobility: Needs Assistance Bed Mobility: Rolling;Supine to Sit;Sit to Supine Rolling: Max assist         General bed mobility comments: sup - sit NT due to 10/10 c/o painin B knees and L UE with movement. Per PT pt is max A  Transfers                 General transfer comment: pt unable with only 1 person assist. Per PT, pt requires 2 person total A for sit - stand    Balance       Sitting balance - Comments: NT, pt unable                                    ADL Overall ADL's : Needs  assistance/impaired Eating/Feeding: Set up Eating/Feeding Details (indicate cue type and reason): using R UE, required set up of tray and to open containers and condiments due to L UE flaccid tone Grooming: Wash/dry hands;Wash/dry face;Set up;Bed level Grooming Details (indicate cue type and reason): with R UE Upper Body Bathing: Minimal assitance;Bed level   Lower Body Bathing: Total assistance;Bed level   Upper Body Dressing : Minimal assistance;Bed level   Lower Body Dressing: Bed level;Total assistance   Toilet Transfer: Total assistance;+2 for physical assistance Toilet Transfer Details (indicate cue type and reason): requires 2 person assist per PT notes Toileting- Clothing Manipulation and Hygiene: Total assistance         General ADL Comments: B knees and L UE painful with movement and PROM. Pt requires total A for ADLs and 2 person assist with mobility     Vision  wears glasses                   Perception Perception Perception Tested?: No   Praxis Praxis Praxis tested?: Not tested    Pertinent Vitals/Pain 8-10/10 pain in B knees and elbows, increasing with PROM/movement     Hand Dominance Right   Extremity/Trunk Assessment Upper Extremity Assessment LUE Deficits / Details:  flaccid, pain in elbow and hand with PROM LUE Sensation: decreased light touch   Lower Extremity Assessment Lower Extremity Assessment: Defer to PT evaluation   Cervical / Trunk Assessment Cervical / Trunk Assessment: Kyphotic   Communication Communication Communication: No difficulties   Cognition Arousal/Alertness: Awake/alert Behavior During Therapy: WFL for tasks assessed/performed Overall Cognitive Status: Within Functional Limits for tasks assessed                     General Comments   Pt pleasant, cooperative and jovial                 Home Living Family/patient expects to be discharged to:: Skilled nursing facility                                  [facility]       Prior Functioning/Environment Level of Independence: Needs assistance  Gait / Transfers Assistance Needed: began working on ambulation at rehab otherwise used w/c ADL's / Homemaking Assistance Needed: assist for rehab staff for dressing/bathing. pt able to feed self with R hand        OT Diagnosis: Generalized weakness;Acute pain;Hemiplegia non-dominant side   OT Problem List: Decreased strength;Decreased knowledge of use of DME or AE;Decreased range of motion;Decreased coordination;Decreased activity tolerance;Impaired UE functional use;Pain;Impaired balance (sitting and/or standing)   OT Treatment/Interventions:      OT Goals(Current goals can be found in the care plan section) Acute Rehab OT Goals Patient Stated Goal: none stated  OT Frequency:     Barriers to D/C:  none                        End of Session    Activity Tolerance: Patient limited by fatigue Patient left: in bed   Time: 1020-1042 OT Time Calculation (min): 22 min Charges:  OT General Charges $OT Visit: 1 Procedure OT Evaluation $Initial OT Evaluation Tier I: 1 Procedure OT Treatments $Therapeutic Activity: 8-22 mins G-Codes:    Joel [name], Joel [name] Joel [name] 01/21/2014, 2:16 PM

## 2014-01-21 NOTE — ED Provider Notes (Signed)
Medical screening examination/treatment/procedure(s) were conducted as a shared visit with non-physician practitioner(s) and myself.  I personally evaluated the patient during the encounter.   EKG Interpretation   Date/Time:  Sunday January 19 2014 21:06:36 EDT Ventricular Rate:  96 PR Interval:    QRS Duration: 110 QT Interval:  354 QTC Calculation: 447 R Axis:   19 Text Interpretation:  Atrial fibrillation Nonspecific T abnormalities,  lateral leads compared to prior, now with PVCs Confirmed by Alexsia Klindt  MD,  TREY ([id number]) on 01/19/2014 11:34:38 PM      80  year old male presenting with altered male status. On my exam, after Narcan, patient alert and oriented, not in distress, nontoxic, normal respiratory effort, normal perfusion, abdomen soft but tender in epigastrium and right upper quadrant. Labs show transaminitis.  Plan CT and admission.  Clinical Impression: 1. Transaminitis   2. Leukocytosis   3. Altered mental status   4. Acute encephalopathy   5. Atrial fibrillation       Candyce ChurnJohn David Lonia Roane III, MD 01/21/14 1023

## 2014-01-21 NOTE — Progress Notes (Signed)
[facility] PROGRESS NOTE  Joel [name] WFU:[id number] DOB: 17-Jun-1928 DOA: 01/19/2014 PCP: No PCP Per Patient  Assessment/Plan: 1. Acute encephalopathy - resolved with narcan, cautious use of PRN opiods in furture 2. Transaminitis - suspected levaquin toxicity. Will follow acute hepatitis panel. LFT's gradually trending down. 3. Leukocytosis - no other signs or symptoms of infection at this point, will monitor. WBC trending down. Possibly due to recent HCAP. 4. A.Fib - Currently rate controlled. continue elliquis 5. BLE knee pain with movement - MRI L spine without nerve impingement. No evidence of fracture on CT scan abd/pelvis. Cont PT/OT. B knee effusion. Pt reports hx of arthritis. Consider trial of indomethacin. Obtain B knee xrays  Code Status: Full Family Communication: Pt in room Disposition Plan: Pending SNF placement  Consultants:    Procedures:    Antibiotics:    HPI/Subjective: Complains of B knee pain  Objective: Filed Vitals:   01/20/14 1447 01/20/14 2140 01/21/14 0136 01/21/14 0547  BP: 135/77 140/75 106/53 121/72  Pulse: 82 77 70 74  Temp: 97.5 F (36.4 C) 97.8 F (36.6 C) 98 F (36.7 C) 97.7 F (36.5 C)  TempSrc: Oral Oral Oral Oral  Resp: _0 Height:      Weight:      SpO2: 96% 100% 100% 99%    Intake/Output Summary (Last 24 hours) at 01/21/14 0830 Last data filed at 01/20/14 1800  Gross per 24 hour  Intake    120 ml  Output    550 ml  Net   -430 ml   Filed Weights   01/19/14 2023 01/20/14 0152  Weight: 85.775 kg (189 lb 1.6 oz) 80.786 kg (178 lb 1.6 oz)    Exam:  General:  Asleep, in nad  Cardiovascular: regular, s1, s2  Respiratory: normal resp effort, no wheezing  Abdomen: soft, nondistended  Musculoskeletal: perfused, no clubbing, B tender knee effusion  Data Reviewed: Basic Metabolic Panel:  Recent Labs Lab 01/19/14 2035 01/20/14 0955  NA 140 143  K 4.3 4.5  CL 101 103  CO2 22 23  GLUCOSE 150* 159*   BUN 57* 56*  CREATININE 1.53* 1.29  CALCIUM 9.3 9.6   Liver Function Tests:  Recent Labs Lab 01/19/14 2035 01/20/14 0955  AST 604* 479*  ALT 431* 396*  ALKPHOS 324* 312*  BILITOT 0.4 0.4  PROT 7.4 7.0  ALBUMIN 2.2* 2.1*   No results found for this basename: LIPASE, AMYLASE,  in the last 168 hours No results found for this basename: AMMONIA,  in the last 168 hours CBC:  Recent Labs Lab 01/19/14 2035 01/20/14 0955  WBC 21.7* 16.9*  HGB 12.4* 12.4*  HCT 35.1* 36.0*  MCV 92.4 92.5  PLT 525* 472*   Cardiac Enzymes: No results found for this basename: CKTOTAL, CKMB, CKMBINDEX, TROPONINI,  in the last 168 hours BNP (last 3 results) No results found for this basename: PROBNP,  in the last 8760 hours CBG:  Recent Labs Lab 01/20/14 0706 01/20/14 1133 01/20/14 1613 01/20/14 2135 01/21/14 0703  GLUCAP 131* 169* 159* 154* 124*    Recent Results (from the past 240 hour(s))  CULTURE, BLOOD (ROUTINE X 2)     Status: None   Collection Time    01/20/14 11:24 AM      Result Value Ref Range Status   Specimen Description BLOOD LEFT HAND   Final   Special Requests BOTTLES DRAWN AEROBIC ONLY 5CC   Final   Culture  Setup Time  Final   Value: 01/20/2014 14:15     Performed at [facility]   Culture     Final   Value:        BLOOD CULTURE RECEIVED NO GROWTH TO DATE CULTURE WILL BE HELD FOR 5 DAYS BEFORE ISSUING A FINAL NEGATIVE REPORT     Performed at [facility]   Report Status PENDING   Incomplete  CULTURE, BLOOD (ROUTINE X 2)     Status: None   Collection Time    01/20/14 11:36 AM      Result Value Ref Range Status   Specimen Description BLOOD LEFT ARM   Final   Special Requests     Final   Value: BOTTLES DRAWN AEROBIC AND ANAEROBIC BLUE 10CC RED 5CC   Culture  Setup Time     Final   Value: 01/20/2014 14:15     Performed at [facility]   Culture     Final   Value:        BLOOD CULTURE RECEIVED NO GROWTH TO DATE CULTURE WILL BE HELD FOR 5  DAYS BEFORE ISSUING A FINAL NEGATIVE REPORT     Performed at [facility]   Report Status PENDING   Incomplete     Studies: Ct Abdomen Pelvis Wo Contrast  01/20/2014   CLINICAL DATA:  Altered mental status.  Loss of appetite.  EXAM: CT ABDOMEN AND PELVIS WITHOUT CONTRAST  TECHNIQUE: Multidetector CT imaging of the abdomen and pelvis was performed following the standard protocol without intravenous contrast.  COMPARISON:  CT of the abdomen and pelvis performed 12/28/2013  FINDINGS: Mild bibasilar airspace opacities, right greater than left, may reflect atelectasis or possibly mild pneumonia.  The liver and spleen are unremarkable in appearance. The gallbladder is within normal limits. The pancreas and adrenal glands are unremarkable.  The kidneys are unremarkable in appearance. There is no evidence of hydronephrosis. No renal or ureteral stones are seen. Mild nonspecific perinephric stranding is noted bilaterally.  No free fluid is identified. The small bowel is unremarkable in appearance. The stomach is within normal limits. No acute vascular abnormalities are seen. Minimal calcification is seen along the right common iliac artery.  The appendix is normal in caliber and contains air, without evidence for appendicitis. Scattered diverticulosis is noted along the transverse, descending and sigmoid colon. The colon is otherwise unremarkable in appearance.  The bladder is mildly distended and grossly unremarkable. The prostate remains normal in size. No inguinal lymphadenopathy is seen.  No acute osseous abnormalities are identified. Healing left-sided rib fractures are noted. Scattered lucencies are seen along the lumbar spine, of uncertain significance. These are similar in appearance to the recent prior study.  IMPRESSION: 1. Mild bibasilar airspace opacities, right greater than left, may reflect atelectasis or possibly mild pneumonia. 2. Scattered lucencies along the lumbar spine, of uncertain  significance. Would consider bone scan for further evaluation, or follow-up with lab values to exclude multiple myeloma. 3. Healing left-sided rib fractures noted. These were first seen on the prior study. 4. Scattered diverticulosis along the transverse, descending and sigmoid colon, without evidence of diverticulitis.   Electronically Signed   By: Garald Balding M.D.   On: 01/20/2014 00:09   Ct Head Wo Contrast  01/19/2014   CLINICAL DATA:  Altered mental status  EXAM: CT HEAD WITHOUT CONTRAST  TECHNIQUE: Contiguous axial images were obtained from the base of the skull through the vertex without intravenous contrast. Study was performed within 24 hr of  patient's arrival the emergency.  COMPARISON:  Brain CT January 09, 2014 and brain MRI January 10, 2014  FINDINGS: There is mild diffuse atrophy. There is no mass, hemorrhage, extra-axial fluid collection, or midline shift. There is small vessel disease throughout much of the centra semiovale bilaterally, stable. There are stable known recent infarcts in the right internal capsule with involvement of portions of the anterior limb, genu, and posterior limbs of the right internal capsule, stable. Small vessel disease is noted in both colonic regions as well as in the superior right lentiform nucleus, stable. No new infarct is seen compared to recent prior studies.  Bony calvarium appears intact. The mastoid air cells are clear. There is left sphenoid sinus disease, slightly progressed compared to recent prior study.  IMPRESSION: Atrophy with small vessel disease. Recent prior right basal ganglia infarcts appear stable. No new infarct is appreciable compared to recent prior studies. No hemorrhage or mass effect. There has been some progression of left sphenoid sinus disease compared to recent prior study.   Electronically Signed   By: Lowella Grip M.D.   On: 01/19/2014 21:48   Mr Lumbar Spine Wo Contrast  01/20/2014   CLINICAL DATA:  Bilateral back and leg pain.   EXAM: MRI LUMBAR SPINE WITHOUT CONTRAST  TECHNIQUE: Multiplanar, multisequence MR imaging was performed. No intravenous contrast was administered.  COMPARISON:  CT abdomen and pelvis 01/19/2014  FINDINGS: There is straightening of the normal lumbar lordosis. There is no listhesis. Bone marrow signal is heterogeneous diffusely. Small Schmorl's nodes are present throughout the lumbar spine with adjacent degenerative marrow changes. Multilevel disc desiccation is present. There is mild disc space narrowing at multiple levels throughout the lumbar spine. The conus medullaris is normal in signal and terminates at the superior aspect of L2. Mildly prominent ventral epidural soft tissues at multiple levels may reflect mild venous engorgement. The visualized paraspinal soft tissues are unremarkable.  T11-12: Only imaged sagittally. Slight disc bulge and mild facet hypertrophy result in mild right neural foraminal narrowing.  T12-L1: Only imaged sagittally. Slight disc bulge and facet hypertrophy result in mild right neural foraminal narrowing.  L1-2: Mild disc bulge and right greater than left facet hypertrophy result in moderate right neural foraminal stenosis without spinal canal stenosis.  L2-3: Mild disc bulge and facet hypertrophy result in mild bilateral neural foraminal narrowing without spinal canal stenosis.  L3-4:  Mild disc bulge and facet hypertrophy without stenosis.  L4-5: Mild disc bulge and facet hypertrophy result in mild to moderate bilateral neural foraminal stenosis. No spinal canal stenosis.  L5-S1: Mild disc bulge and right greater than left facet hypertrophy result in mild to moderate bilateral neural foraminal stenosis. No spinal canal stenosis.  IMPRESSION: 1. Mild-to-moderate multilevel degenerative disc disease and facet arthrosis resulting in mild to moderate neural foraminal stenosis as above, greatest at L4-5 and L5-S1. No spinal canal stenosis. 2. Heterogeneous bone marrow signal throughout  the lumbar spine. Much of this is secondary to Schmorl's nodes and adjacent degenerative marrow changes, although an infiltrative process such as multiple myeloma cannot be excluded.   Electronically Signed   By: Logan Bores   On: 01/20/2014 09:03   Dg Chest Port 1 View  01/19/2014   CLINICAL DATA:  Altered mental status; recent diagnosis of rib fractures.  EXAM: PORTABLE CHEST - 1 VIEW  COMPARISON:  Chest radiograph from 01/09/2014, and CT of the chest performed 12/28/2013  FINDINGS: The lungs are well-aerated. Minimal left-sided atelectasis is seen. There is no  evidence of pleural effusion or pneumothorax.  The cardiomediastinal silhouette is mildly enlarged. No acute osseous abnormalities are seen.  IMPRESSION: Mild cardiomegaly; minimal left-sided atelectasis seen. Known left-sided rib fractures are not well characterized on radiograph.   Electronically Signed   By: Garald Balding M.D.   On: 01/19/2014 21:12    Scheduled Meds: . amLODipine  10 mg Oral Daily  . apixaban  5 mg Oral BID  . atorvastatin  40 mg Oral QHS  . brinzolamide  1 drop Both Eyes BID  . carvedilol  12.5 mg Oral BID WC  . latanoprost  1 drop Both Eyes QHS  . lidocaine  1 patch Transdermal Q24H  . polyethylene glycol  17 g Oral Daily  . sennosides-docusate sodium  2 tablet Oral BID  . traMADol  25 mg Oral 3 times per day   Continuous Infusions: . sodium chloride 1,000 mL (01/21/14 0053)    Principal Problem:   Transaminitis Active Problems:   Acute encephalopathy   Atrial fibrillation   HTN (hypertension)   Leukocytosis  Time spent: 22mn  Arfa Lamarca, S[facility]high[facility]ospitalists Pager [phone] If 7PM-7AM, please contact night-coverage at [email or web address], password [email or web address]4/04/2014, 8:30 AM  LOS: 2 days

## 2014-01-22 ENCOUNTER — Inpatient Hospital Stay (HOSPITAL_COMMUNITY): Payer: PRIVATE HEALTH INSURANCE

## 2014-01-22 DIAGNOSIS — I4891 Unspecified atrial fibrillation: Secondary | ICD-10-CM

## 2014-01-22 DIAGNOSIS — G9340 Encephalopathy, unspecified: Secondary | ICD-10-CM

## 2014-01-22 DIAGNOSIS — E785 Hyperlipidemia, unspecified: Secondary | ICD-10-CM

## 2014-01-22 DIAGNOSIS — J189 Pneumonia, unspecified organism: Secondary | ICD-10-CM

## 2014-01-22 LAB — GLUCOSE, CAPILLARY
Glucose-Capillary: 109 mg/dL — ABNORMAL HIGH (ref 70–99)
Glucose-Capillary: 156 mg/dL — ABNORMAL HIGH (ref 70–99)
Glucose-Capillary: 129 mg/dL — ABNORMAL HIGH (ref 70–99)

## 2014-01-22 LAB — COMPREHENSIVE METABOLIC PANEL
ALT: 351 U/L — ABNORMAL HIGH (ref 0–53)
AST: 400 U/L — ABNORMAL HIGH (ref 0–37)
Albumin: 1.8 g/dL — ABNORMAL LOW (ref 3.5–5.2)
Alkaline Phosphatase: 328 U/L — ABNORMAL HIGH (ref 39–117)
BUN: 31 mg/dL — ABNORMAL HIGH (ref 6–23)
CO2: 22 meq/L (ref 19–32)
CREATININE: 0.9 mg/dL (ref 0.50–1.35)
Calcium: 8.8 mg/dL (ref 8.4–10.5)
Chloride: 109 mEq/L (ref 96–112)
GFR, EST AFRICAN AMERICAN: 89 mL/min — AB (ref >90)
GFR, EST NON AFRICAN AMERICAN: 77 mL/min — AB (ref >90)
GLUCOSE: 123 mg/dL — AB (ref 70–99)
Potassium: 4.5 mEq/L (ref 3.7–5.3)
Sodium: 144 mEq/L (ref 137–147)
Total Bilirubin: 0.6 mg/dL (ref 0.3–1.2)
Total Protein: 6.3 g/dL (ref 6.0–8.3)

## 2014-01-22 LAB — CBC
HEMATOCRIT: 36 % — AB (ref 39.0–52.0)
HEMOGLOBIN: 11.9 g/dL — AB (ref 13.0–17.0)
MCH: 31.3 pg (ref 26.0–34.0)
MCHC: 33.1 g/dL (ref 30.0–36.0)
MCV: 94.7 fL (ref 78.0–100.0)
Platelets: 488 10*3/uL — ABNORMAL HIGH (ref 150–400)
RBC: 3.8 MIL/uL — AB (ref 4.22–5.81)
RDW: 13.7 % (ref 11.5–15.5)
WBC: 12 10*3/uL — ABNORMAL HIGH (ref 4.0–10.5)

## 2014-01-22 MED ORDER — INDOMETHACIN 50 MG PO CAPS: 50.0000 mg | ORAL_CAPSULE | Freq: Three times a day (TID) | ORAL | Status: DC | PRN

## 2014-01-22 NOTE — Clinical Social Work Note (Addendum)
CSW spoke with pt's daughter, Dois DavenportSandra, regarding SNF bed offers and pt's discharge on 01/22/2014. Pt's daughter to call CSW at 2pm with SNF bed choice. Pt's daughter expressed concern for "stomach x-ray results." CSW has made MD aware of pt's daughter request to speak with MD regarding results.  [facility]. Discharge packet has been completed and placed on pt's shadow chart. Pt's daughter to [facility]. CSW to call for ambulance transportation once called [facility].   RN to please call report at [phone]  Darlyn ChamberEmily [place], [facility] Hospital[facility]WA Clinical Social Worker [phone]

## 2014-01-22 NOTE — Progress Notes (Signed)
Physical Therapy Treatment Patient Details Name: Joel [name] MRN: [id number] DOB: [birth year] Today's Date: 01/22/2014    History of Present Illness 80 year old male with past medical history of recent CVA (right MCA stroke), hospitalized at [facility], was found to be in atrial fibrillation and is now on Eliquis for anticoagulation who presented to [facility] ED 01/09/2014 with complaints of increased lethargy and confusion as noted by his family for past 24 hours. Patient is from nursing home and per SNF report patient had a fever there of 101.8 F and his oxygen saturation was 81%. There was no respiratory distress. No complaints of chest pain, palpitations of shortness of breath. No cough. No abdominal pain, no nausea or vomiting. No diarrhea and no blood in stool or urine.      PT Comments    Patient demonstrates no significant improvement in mobility at this time, total assist for transfer to chair.   Follow Up Recommendations  SNF     Equipment Recommendations  None recommended by PT    Recommendations for Other Services       Precautions / Restrictions Precautions Precautions: Fall Restrictions Weight Bearing Restrictions: No    Mobility  Bed Mobility Overal bed mobility: Needs Assistance Bed Mobility: Rolling;Supine to Sit Rolling: Max assist   Supine to sit: Max assist     General bed mobility comments: Max assist using chuck pad for mobility, patient able to initiate RUE movement using hand rail, extreme pain noted with mobility as patient crying out in pain.  Transfers Overall transfer level: Needs assistance Equipment used:  (2 person gait belt and bed pad) Transfers: Sit to/from [employer] Transfers Sit to Stand: +2 physical assistance;Total assist Stand pivot transfers: +2 physical assistance;Total assist       General transfer comment: pt unable with only 1 person assist. Per PT, pt requires 2 person total A for sit - stand  Ambulation/Gait                  Stairs            Wheelchair Mobility    Modified Rankin (Stroke Patients Only)       Balance     Sitting balance-Leahy Scale: Poor Sitting balance - Comments: Patient with heavy LUE lean, with posterior bias, unable to come off left elbow support. Maximal assist to approach midline but unable to withstand pain and position without assist.                            Cognition Arousal/Alertness: Awake/alert Behavior During Therapy: WFL for tasks assessed/performed Overall Cognitive Status: Within Functional Limits for tasks assessed                      Exercises      General Comments        Pertinent Vitals/Pain Pain reported in Knees bilaterally and RUE    Home Living Family/patient expects to be discharged to:: Skilled nursing facility               Additional Comments: pt was at [facility].     Prior Function Level of Independence: Needs assistance  Gait / Transfers Assistance Needed: began working on ambulation at rehab otherwise used w/c ADL's / Homemaking Assistance Needed: assist for rehab staff for dressing/bathing. pt able to feed self with R hand     PT Goals (current goals can now be  found in the care plan section) Acute Rehab PT Goals Patient Stated Goal: none stated PT Goal Formulation: With patient Time For Goal Achievement: 02/03/14 Potential to Achieve Goals: Fair    Frequency  Min 3X/week    PT Plan      Co-evaluation             End of Session Equipment Utilized During Treatment: Gait belt;Oxygen (3L O2 on wall) Activity Tolerance: Patient limited by fatigue;Patient limited by pain Patient left: in chair;with call bell/phone within reach;with chair alarm set     Time: [id number] PT Time Calculation (min): 16 min  Charges:  $Therapeutic Activity: 8-22 mins                    G Codes:      Fabio AsaDevon J Laraina Sulton 01/22/2014, 11:38 AM Charlotte Crumbevon Marcy Bogosian, PT DPT  [phone]

## 2014-01-22 NOTE — Discharge Summary (Signed)
Physician Discharge Summary  Joel [name] ZOX:[id number] DOB: 10-04-1928 DOA: 01/19/2014  PCP: No PCP Per Patient  Admit date: 01/19/2014 Discharge date: 01/22/2014  Time spent: 45 minutes  Recommendations for Outpatient Follow-up:  Patient will be discharged to skilled nursing facility. He will need followup with his primary care physician within one week of discharge. He will also need to follow up with Dr. Collene Mares, gastroenterologist within one to two weeks of discharge.  Patient will contacted with this appointment.  Patient should also have his CMP monitored. Patient should follow recommendations made by physical therapy as well as occupational therapy at the nursing home. He should continue his medications as prescribed.  Discharge Diagnoses:  Acute encephalopathy  Transaminitis  Leukocytosis  Atrial fibrillation  Bilateral knee pain with movement  Discharge Condition: Stable  Diet recommendation: Dysphagia 2   Filed Weights   01/19/14 2023 01/20/14 0152  Weight: 85.775 kg (189 lb 1.6 oz) 80.786 kg (178 lb 1.6 oz)    History of present illness:  Joel [name] is a 80 y.o. male who presents to the ED with AMS. He was recently admitted to our service for HCAP and discharged home on levaquin. AMS onset earlier today, he is "staring into space, less responsive". Per daughter also not eating or drinking much. He has been on narcotics for rib pain from a fall recently. His AMS resolved in ED after being given narcan.   Hospital Course:  Acute encephalopathy  -Possibly secondary to narcotic overdose, reversed and improved with Narcan  -Cautious use of opioids  -Improving   Transaminitis  -Possibly secondary to Levaquin toxicity  -LFTs gradually trending downward  -Abdominal ultrasound:Question minimal fatty infiltration of liver  -Gastroenterology consulted via phone and will follow up with patient as an outpatient  Leukocytosis  -No signs or symptoms of infection at this time    -Trending downward  -Possibly acute phase reactant versus recent pneumonia   Atrial fibrillation  -Currently rate controlled  -Continue Eliquis   Bilateral knee pain with movement  -MRI of the lumbar spine showed no nerve impingement, no evidence of fracture on CT scan of the abdomen and pelvis  -PT and OT were consulted  -History of arthritis  -Continue NSAIDs  -Knee Xray: Degenerative change with suprapatellar effusion  Procedures: None  Consultations: None  Discharge Exam: Filed Vitals:   01/22/14 1007  BP: 132/72  Pulse: 86  Temp: 98.5 F (36.9 C)  Resp: 18   General: Well developed, well nourished, NAD, appears stated age  HEENT: NCAT, PERRLA, EOMI, Anicteic Sclera, mucous membranes moist.  Neck: Supple, no JVD, no masses  Cardiovascular: S1 S2 auscultated, irregular  Respiratory: Clear to auscultation bilaterally with equal chest rise  Abdomen: Soft, RUQ tender, nondistended, + bowel sounds  Extremities: warm dry without cyanosis clubbing or edema, tenderness to palpation of the knees bilaterally  Neuro: AAOx3, no focal deficits  Skin: Without rashes exudates or nodules  Psych: Normal affect and demeanor with intact judgement and insight  Discharge Instructions      Discharge Orders   Future Orders Complete By Expires   Discharge instructions  As directed    Increase activity slowly  As directed        Medication List    STOP taking these medications       levofloxacin 500 MG tablet  Commonly known as:  LEVAQUIN     oxyCODONE 5 MG immediate release tablet  Commonly known as:  Oxy IR/ROXICODONE      TAKE  these medications       acetaminophen 325 MG tablet  Commonly known as:  TYLENOL  Take 975 mg by mouth 3 (three) times daily.     amLODipine 10 MG tablet  Commonly known as:  NORVASC  Take 10 mg by mouth daily.     apixaban 5 MG Tabs tablet  Commonly known as:  ELIQUIS  Take 5 mg by mouth 2 (two) times daily.     atorvastatin 40 MG  tablet  Commonly known as:  LIPITOR  Take 40 mg by mouth at bedtime.     brinzolamide 1 % ophthalmic suspension  Commonly known as:  AZOPT  Place 1 drop into both eyes 2 (two) times daily.     Brinzolamide-Brimonidine 1-0.2 % Susp  Place 1 drop into both eyes 3 (three) times daily.     carvedilol 12.5 MG tablet  Commonly known as:  COREG  Take 12.5 mg by mouth 2 (two) times daily with a meal.     indomethacin 50 MG capsule  Commonly known as:  INDOCIN  Take 1 capsule (50 mg total) by mouth 3 (three) times daily as needed for mild pain or moderate pain.     ipratropium-albuterol 0.5-2.5 (3) MG/3ML Soln  Commonly known as:  DUONEB  Take 3 mLs by nebulization every 4 (four) hours as needed.     lidocaine 5 %  Commonly known as:  LIDODERM  Place 1 patch onto the skin daily. Remove & Discard patch within 12 hours or as directed by MD     polyethylene glycol packet  Commonly known as:  MIRALAX / GLYCOLAX  Take 17 g by mouth daily.     sennosides-docusate sodium 8.6-50 MG tablet  Commonly known as:  SENOKOT-S  Take 2 tablets by mouth 2 (two) times daily.     traMADol 50 MG tablet  Commonly known as:  ULTRAM  Take 25 mg by mouth every 8 (eight) hours.     travoprost (benzalkonium) 0.004 % ophthalmic solution  Commonly known as:  TRAVATAN  Place 1 drop into both eyes at bedtime.       No Known Allergies Follow-up Information   Follow up with Hennie Duos, MD In 2 days.   Specialty:  Internal Medicine   Contact information:   [place] [address] [phone]       Follow up with MANN,JYOTHI, MD. Schedule an appointment as soon as possible for a visit in 1 week.   Specialty:  Gastroenterology   Contact information:   [address] [place] [address] [phone]        The results of significant diagnostics from this hospitalization (including imaging, microbiology, ancillary and laboratory) are listed below for  reference.    Significant Diagnostic Studies: Ct Abdomen Pelvis Wo Contrast  01/20/2014   CLINICAL DATA:  Altered mental status.  Loss of appetite.  EXAM: CT ABDOMEN AND PELVIS WITHOUT CONTRAST  TECHNIQUE: Multidetector CT imaging of the abdomen and pelvis was performed following the standard protocol without intravenous contrast.  COMPARISON:  CT of the abdomen and pelvis performed 12/28/2013  FINDINGS: Mild bibasilar airspace opacities, right greater than left, may reflect atelectasis or possibly mild pneumonia.  The liver and spleen are unremarkable in appearance. The gallbladder is within normal limits. The pancreas and adrenal glands are unremarkable.  The kidneys are unremarkable in appearance. There is no evidence of hydronephrosis. No renal or ureteral stones are seen. Mild nonspecific perinephric  stranding is noted bilaterally.  No free fluid is identified. The small bowel is unremarkable in appearance. The stomach is within normal limits. No acute vascular abnormalities are seen. Minimal calcification is seen along the right common iliac artery.  The appendix is normal in caliber and contains air, without evidence for appendicitis. Scattered diverticulosis is noted along the transverse, descending and sigmoid colon. The colon is otherwise unremarkable in appearance.  The bladder is mildly distended and grossly unremarkable. The prostate remains normal in size. No inguinal lymphadenopathy is seen.  No acute osseous abnormalities are identified. Healing left-sided rib fractures are noted. Scattered lucencies are seen along the lumbar spine, of uncertain significance. These are similar in appearance to the recent prior study.  IMPRESSION: 1. Mild bibasilar airspace opacities, right greater than left, may reflect atelectasis or possibly mild pneumonia. 2. Scattered lucencies along the lumbar spine, of uncertain significance. Would consider bone scan for further evaluation, or follow-up with lab values to  exclude multiple myeloma. 3. Healing left-sided rib fractures noted. These were first seen on the prior study. 4. Scattered diverticulosis along the transverse, descending and sigmoid colon, without evidence of diverticulitis.   Electronically Signed   By: Garald Balding M.D.   On: 01/20/2014 00:09   Dg Chest 2 View  01/09/2014   CLINICAL DATA:  Weakness on the left side.  EXAM: CHEST  2 VIEW  COMPARISON:  Chest x-ray 01/09/2014.  FINDINGS: Lung volumes are slightly low. Mild diffuse peribronchial cuffing. No definite acute consolidative airspace disease. No definite pleural effusions. No evidence of pulmonary edema. Heart size is mildly enlarged. Upper mediastinal contours are within normal limits. Atherosclerosis in the thoracic aorta.  IMPRESSION: 1. Mild diffuse peribronchial cuffing. This could suggest a mild bronchitis. 2. Mild cardiomegaly. 3. Atherosclerosis.   Electronically Signed   By: Vinnie Langton M.D.   On: 01/09/2014 23:25   Dg Chest 2 View  01/09/2014   CLINICAL DATA:  Altered mental status.  Fever.  EXAM: CHEST  2 VIEW  COMPARISON:  CT CHEST W/O CM dated 12/28/2013  FINDINGS: The cardiac silhouette is enlarged. The lungs are mildly hypoinflated with mild bibasilar opacities. No pleural effusion or pneumothorax is identified. Minimally displaced left-sided rib fractures are better demonstrated on recent CT.  IMPRESSION: Hypoinflation with mild bibasilar opacity, likely atelectasis. No pneumothorax identified.   Electronically Signed   By: Logan Bores   On: 01/09/2014 19:38   Dg Knee 1-2 Views Left  01/21/2014   CLINICAL DATA:  Left knee pain  EXAM: LEFT KNEE - 1-2 VIEW  COMPARISON:  01/09/2014  FINDINGS: Degenerative changes are noted in all 3 joint compartments. A small joint effusion is seen new from the prior exam. No acute fracture or dislocation is seen.  IMPRESSION: Degenerative change with suprapatellar effusion.   Electronically Signed   By: Inez Catalina M.D.   On: 01/21/2014 14:53    Dg Knee 1-2 Views Right  01/21/2014   CLINICAL DATA:  Pain and effusion  EXAM: RIGHT KNEE - 1-2 VIEW  COMPARISON:  None.  FINDINGS: There is tricompartmental osteoarthritis, severe in the patellofemoral compartment and medial compartment. There appears to be near complete loss of joint space in the medial compartment, with bone touching bone. There is calcification in the lateral meniscus consistent with chondrocalcinosis, and suggesting CPPD arthropathy. There is a moderate-sized joint effusion and there is diffuse subcutaneous edema. No acute fracture is identified.  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 Moderate-sized joint  effusion and evidence of CPPD arthritis (given the presence of chondrocalcinosis in the lateral meniscus).   Electronically Signed   By: Curlene Dolphin M.D.   On: 01/21/2014 15:08   Ct Head Wo Contrast  01/19/2014   CLINICAL DATA:  Altered mental status  EXAM: CT HEAD WITHOUT CONTRAST  TECHNIQUE: Contiguous axial images were obtained from the base of the skull through the vertex without intravenous contrast. Study was performed within 24 hr of patient's arrival the emergency.  COMPARISON:  Brain CT January 09, 2014 and brain MRI January 10, 2014  FINDINGS: There is mild diffuse atrophy. There is no mass, hemorrhage, extra-axial fluid collection, or midline shift. There is small vessel disease throughout much of the centra semiovale bilaterally, stable. There are stable known recent infarcts in the right internal capsule with involvement of portions of the anterior limb, genu, and posterior limbs of the right internal capsule, stable. Small vessel disease is noted in both colonic regions as well as in the superior right lentiform nucleus, stable. No new infarct is seen compared to recent prior studies.  Bony calvarium appears intact. The mastoid air cells are clear. There is left sphenoid sinus disease, slightly progressed compared  to recent prior study.  IMPRESSION: Atrophy with small vessel disease. Recent prior right basal ganglia infarcts appear stable. No new infarct is appreciable compared to recent prior studies. No hemorrhage or mass effect. There has been some progression of left sphenoid sinus disease compared to recent prior study.   Electronically Signed   By: Lowella Grip M.D.   On: 01/19/2014 21:48   Ct Head Wo Contrast  01/09/2014   CLINICAL DATA:  Altered mental status and fever.  EXAM: CT HEAD WITHOUT CONTRAST  TECHNIQUE: Contiguous axial images were obtained from the base of the skull through the vertex without intravenous contrast.  COMPARISON:  12/28/2013  FINDINGS: Hypoattenuation involving the right basal ganglia is stable to slightly decreased compared to the prior study. Periventricular white matter hypodensities do not appear significantly changed and are compatible with moderate chronic small vessel ischemic disease. There is no evidence of acute intracranial hemorrhage. Cerebral atrophy is unchanged. There is no midline shift or extra-axial fluid collection. Orbits are unremarkable. Mastoid air cells are clear. Small amount of frothy secretion is present in the left sphenoid sinus.  IMPRESSION: 1. No evidence of acute intracranial hemorrhage or definite new infarct. 2. Stable to slightly decreased prominence right basal ganglia hypoattenuation, which may reflect evolving infarct.   Electronically Signed   By: Logan Bores   On: 01/09/2014 20:41   Mr Brain Wo Contrast  01/10/2014   CLINICAL DATA:  Altered mental status. History of right MCA stroke. Sepsis. Atrial fibrillation  EXAM: MRI HEAD WITHOUT CONTRAST  MRA HEAD WITHOUT CONTRAST  TECHNIQUE: Multiplanar, multiecho pulse sequences of the brain and surrounding structures were obtained without intravenous contrast. Angiographic images of the head were obtained using MRA technique without contrast.  COMPARISON:  CT head 01/09/2014  FINDINGS: MRI HEAD  FINDINGS  Restricted diffusion within the right posterior frontal cortex, right basal ganglia, and periventricular white matter consistent with the history of recent stroke. No blood products to suggest hemorrhagic conversion. No left-sided ischemic change.  Moderate cerebral and cerebellar atrophy. Advanced subcortical and periventricular white matter signal abnormality, also affecting the brainstem, most consistent with chronic microvascular ischemic change. Flow voids are maintained in the carotid and basilar arteries. Right vertebral is dominant with diminished flow related enhancement left vertebral.  Mild pannus. Normal  pituitary and cerebellar tonsils. No osseous lesions. No acute sinus or mastoid disease. Negative appearing orbits. Similar appearance to most recent CT.  MRA HEAD FINDINGS  Internal carotid arteries are widely patent. Basilar artery is widely patent with left vertebral dominant. Right vertebral demonstrates diminished flow related enhancement, likely distal stenosis of a flow-limiting nature.  There are tandem stenoses involving the M1 segment of the right middle cerebral artery potentially flow reducing. Diminished number of MCA trifurcation vessels on the right.  The left M1 middle cerebral artery demonstrates severe to absent flow related enhancement. No appreciable trifurcation branch visualization  Severe stenosis P2 segment right posterior cerebral artery. Left PCA widely patent.  Mild irregularity both proximal anterior cerebral arteries without distal disease. Cerebellar branches poorly visualized with gross patency of the superior cerebellar arteries. No visible intracranial aneurysm.  IMPRESSION: Restricted diffusion within the right posterior frontal cortex, right basal ganglia, and periventricular white matter consistent with the history of recent stroke. The distribution of ischemia is consistent with a late acute to subacute right MCA territory insult.  No hemorrhagic  transformation, and no multifocal areas of acute infarction which might suggest an embolic phenomenon.  Severe intracranial atherosclerotic disease, most notable in the left greater than right middle cerebral arteries, left distal vertebral artery, and right P2 posterior cerebral artery. See discussion above.   Electronically Signed   By: Rolla Flatten M.D.   On: 01/10/2014 15:57   Mr Lumbar Spine Wo Contrast  01/20/2014   CLINICAL DATA:  Bilateral back and leg pain.  EXAM: MRI LUMBAR SPINE WITHOUT CONTRAST  TECHNIQUE: Multiplanar, multisequence MR imaging was performed. No intravenous contrast was administered.  COMPARISON:  CT abdomen and pelvis 01/19/2014  FINDINGS: There is straightening of the normal lumbar lordosis. There is no listhesis. Bone marrow signal is heterogeneous diffusely. Small Schmorl's nodes are present throughout the lumbar spine with adjacent degenerative marrow changes. Multilevel disc desiccation is present. There is mild disc space narrowing at multiple levels throughout the lumbar spine. The conus medullaris is normal in signal and terminates at the superior aspect of L2. Mildly prominent ventral epidural soft tissues at multiple levels may reflect mild venous engorgement. The visualized paraspinal soft tissues are unremarkable.  T11-12: Only imaged sagittally. Slight disc bulge and mild facet hypertrophy result in mild right neural foraminal narrowing.  T12-L1: Only imaged sagittally. Slight disc bulge and facet hypertrophy result in mild right neural foraminal narrowing.  L1-2: Mild disc bulge and right greater than left facet hypertrophy result in moderate right neural foraminal stenosis without spinal canal stenosis.  L2-3: Mild disc bulge and facet hypertrophy result in mild bilateral neural foraminal narrowing without spinal canal stenosis.  L3-4:  Mild disc bulge and facet hypertrophy without stenosis.  L4-5: Mild disc bulge and facet hypertrophy result in mild to moderate  bilateral neural foraminal stenosis. No spinal canal stenosis.  L5-S1: Mild disc bulge and right greater than left facet hypertrophy result in mild to moderate bilateral neural foraminal stenosis. No spinal canal stenosis.  IMPRESSION: 1. Mild-to-moderate multilevel degenerative disc disease and facet arthrosis resulting in mild to moderate neural foraminal stenosis as above, greatest at L4-5 and L5-S1. No spinal canal stenosis. 2. Heterogeneous bone marrow signal throughout the lumbar spine. Much of this is secondary to Schmorl's nodes and adjacent degenerative marrow changes, although an infiltrative process such as multiple myeloma cannot be excluded.   Electronically Signed   By: Logan Bores   On: 01/20/2014 09:03   US Abdomen Limited  01/22/2014   CLINICAL DATA:  Elevated LFTs  EXAM: US ABDOMEN LIMITED - RIGHT UPPER QUADRANT  COMPARISON:  CT abdomen and pelvis 01/19/2014  FINDINGS: Gallbladder:  Normally distended without stones or wall thickening.  No pericholecystic fluid or sonographic Murphy sign.  Common bile duct:  Diameter: Normal caliber 3 mm diameter  Liver:  Minimally, question mild fatty infiltration, though this can be seen with cirrhosis and certain infiltrative disorders. No focal hepatic mass or nodularity. Hepatopetal portal venous flow.  No right upper quadrant ascites.  IMPRESSION: Question minimal fatty infiltration of liver as above.  Otherwise negative exam.   Electronically Signed   By: Lavonia Dana M.D.   On: 01/22/2014 08:19   Dg Chest Port 1 View  01/19/2014   CLINICAL DATA:  Altered mental status; recent diagnosis of rib fractures.  EXAM: PORTABLE CHEST - 1 VIEW  COMPARISON:  Chest radiograph from 01/09/2014, and CT of the chest performed 12/28/2013  FINDINGS: The lungs are well-aerated. Minimal left-sided atelectasis is seen. There is no evidence of pleural effusion or pneumothorax.  The cardiomediastinal silhouette is mildly enlarged. No acute osseous abnormalities are seen.   IMPRESSION: Mild cardiomegaly; minimal left-sided atelectasis seen. Known left-sided rib fractures are not well characterized on radiograph.   Electronically Signed   By: Garald Balding M.D.   On: 01/19/2014 21:12   Dg Knee Left Port  01/10/2014   CLINICAL DATA:  Fall  EXAM: PORTABLE LEFT KNEE - 1-2 VIEW  COMPARISON:  None available  FINDINGS: Study is somewhat limited due to patient positioning. Severe tricompartmental degenerative osteoarthrosis is seen as evidenced by joint space narrowing, juxta-articular osteophytosis, and subchondral sclerosis. Findings are most severe within the patellofemoral joint space compartment. A small joint effusion present within the suprapatellar recess. Chondrocalcinosis noted. No definite acute fracture or dislocation.  No soft tissue abnormality identified.  IMPRESSION: 1. Somewhat limited study due to patient positioning. No definite acute fracture or dislocation. 2. Severe tricompartmental degenerative osteoarthrosis. 3. Small joint effusion.   Electronically Signed   By: Jeannine Boga M.D.   On: 01/10/2014 00:27   Mr Jodene Nam Head/brain Wo Cm  01/10/2014   CLINICAL DATA:  Altered mental status. History of right MCA stroke. Sepsis. Atrial fibrillation  EXAM: MRI HEAD WITHOUT CONTRAST  MRA HEAD WITHOUT CONTRAST  TECHNIQUE: Multiplanar, multiecho pulse sequences of the brain and surrounding structures were obtained without intravenous contrast. Angiographic images of the head were obtained using MRA technique without contrast.  COMPARISON:  CT head 01/09/2014  FINDINGS: MRI HEAD FINDINGS  Restricted diffusion within the right posterior frontal cortex, right basal ganglia, and periventricular white matter consistent with the history of recent stroke. No blood products to suggest hemorrhagic conversion. No left-sided ischemic change.  Moderate cerebral and cerebellar atrophy. Advanced subcortical and periventricular white matter signal abnormality, also affecting the  brainstem, most consistent with chronic microvascular ischemic change. Flow voids are maintained in the carotid and basilar arteries. Right vertebral is dominant with diminished flow related enhancement left vertebral.  Mild pannus. Normal pituitary and cerebellar tonsils. No osseous lesions. No acute sinus or mastoid disease. Negative appearing orbits. Similar appearance to most recent CT.  MRA HEAD FINDINGS  Internal carotid arteries are widely patent. Basilar artery is widely patent with left vertebral dominant. Right vertebral demonstrates diminished flow related enhancement, likely distal stenosis of a flow-limiting nature.  There are tandem stenoses involving the M1 segment of the right middle cerebral artery potentially flow reducing. Diminished number of MCA trifurcation vessels  on the right.  The left M1 middle cerebral artery demonstrates severe to absent flow related enhancement. No appreciable trifurcation branch visualization  Severe stenosis P2 segment right posterior cerebral artery. Left PCA widely patent.  Mild irregularity both proximal anterior cerebral arteries without distal disease. Cerebellar branches poorly visualized with gross patency of the superior cerebellar arteries. No visible intracranial aneurysm.  IMPRESSION: Restricted diffusion within the right posterior frontal cortex, right basal ganglia, and periventricular white matter consistent with the history of recent stroke. The distribution of ischemia is consistent with a late acute to subacute right MCA territory insult.  No hemorrhagic transformation, and no multifocal areas of acute infarction which might suggest an embolic phenomenon.  Severe intracranial atherosclerotic disease, most notable in the left greater than right middle cerebral arteries, left distal vertebral artery, and right P2 posterior cerebral artery. See discussion above.   Electronically Signed   By: Rolla Flatten M.D.   On: 01/10/2014 15:57     Microbiology: Recent Results (from the past 240 hour(s))  URINE CULTURE     Status: None   Collection Time    01/20/14 10:28 AM      Result Value Ref Range Status   Specimen Description URINE, CATHETERIZED   Final   Special Requests NONE   Final   Culture  Setup Time     Final   Value: 01/20/2014 11:07     Performed at [place]     Final   Value: NO GROWTH     Performed at [facility]   Culture     Final   Value: NO GROWTH     Performed at [facility]   Report Status 01/21/2014 FINAL   Final  CULTURE, BLOOD (ROUTINE X 2)     Status: None   Collection Time    01/20/14 11:24 AM      Result Value Ref Range Status   Specimen Description BLOOD LEFT HAND   Final   Special Requests BOTTLES DRAWN AEROBIC ONLY 5CC   Final   Culture  Setup Time     Final   Value: 01/20/2014 14:15     Performed at [facility]   Culture     Final   Value:        BLOOD CULTURE RECEIVED NO GROWTH TO DATE CULTURE WILL BE HELD FOR 5 DAYS BEFORE ISSUING A FINAL NEGATIVE REPORT     Performed at [facility]   Report Status PENDING   Incomplete  CULTURE, BLOOD (ROUTINE X 2)     Status: None   Collection Time    01/20/14 11:36 AM      Result Value Ref Range Status   Specimen Description BLOOD LEFT ARM   Final   Special Requests     Final   Value: BOTTLES DRAWN AEROBIC AND ANAEROBIC BLUE 10CC RED 5CC   Culture  Setup Time     Final   Value: 01/20/2014 14:15     Performed at [facility]   Culture     Final   Value:        BLOOD CULTURE RECEIVED NO GROWTH TO DATE CULTURE WILL BE HELD FOR 5 DAYS BEFORE ISSUING A FINAL NEGATIVE REPORT     Performed at [facility]   Report Status PENDING   Incomplete     Labs: Basic Metabolic Panel:  Recent Labs Lab 01/19/14 2035 01/20/14 0955 01/21/14 0720 01/22/14 0445  NA  140 143 144 144  K 4.3 4.5 4.5 4.5  CL 101 103 108 109  CO2 _0 GLUCOSE 150* 159* 126* 123*  BUN  57* 56* 44* 31*  CREATININE 1.53* 1.29 1.05 0.90  CALCIUM 9.3 9.6 9.0 8.8   Liver Function Tests:  Recent Labs Lab 01/19/14 2035 01/20/14 0955 01/21/14 0720 01/22/14 0445  AST 604* 479* 550* 400*  ALT 431* 396* 405* 351*  ALKPHOS 324* 312* 313* 328*  BILITOT 0.4 0.4 0.5 0.6  PROT 7.4 7.0 6.3 6.3  ALBUMIN 2.2* 2.1* 1.8* 1.8*   No results found for this basename: LIPASE, AMYLASE,  in the last 168 hours No results found for this basename: AMMONIA,  in the last 168 hours CBC:  Recent Labs Lab 01/19/14 2035 01/20/14 0955  WBC 21.7* 16.9*  HGB 12.4* 12.4*  HCT 35.1* 36.0*  MCV 92.4 92.5  PLT 525* 472*   Cardiac Enzymes: No results found for this basename: CKTOTAL, CKMB, CKMBINDEX, TROPONINI,  in the last 168 hours BNP: BNP (last 3 results) No results found for this basename: PROBNP,  in the last 8760 hours CBG:  Recent Labs Lab 01/21/14 0703 01/21/14 1131 01/21/14 1631 01/21/14 2158 01/22/14 0637  GLUCAP 124* 142* 184* 126* 109*       Signed:  Shaniya Tashiro  [facility] 01/22/2014, 11:49 AM

## 2014-01-22 NOTE — Progress Notes (Signed)
Patient is discharged from room 4N21 at this time. Alert and in stable condition. IV site d/c'd. [facility].

## 2014-01-22 NOTE — Discharge Instructions (Signed)
Altered Mental Status °Altered mental status most often refers to an abnormal change in your responsiveness and awareness. It can affect your speech, thought, mobility, memory, attention span, or alertness. It can range from slight confusion to complete unresponsiveness (coma). Altered mental status can be a sign of a serious underlying medical condition. Rapid evaluation and medical treatment is necessary for patients having an altered mental status. °CAUSES  °· Low blood sugar (hypoglycemia) or diabetes. °· Severe loss of body fluids (dehydration) or a body salt (electrolyte) imbalance. °· A stroke or other neurologic problem, such as dementia or delirium. °· A head injury or tumor. °· A drug or alcohol overdose. °· Exposure to toxins or poisons. °· Depression, anxiety, and stress. °· A low oxygen level (hypoxia). °· An infection. °· Blood loss. °· Twitching or shaking (seizure). °· Heart problems, such as heart attack or heart rhythm problems (arrhythmias). °· A body temperature that is too low or too high (hypothermia or hyperthermia). °DIAGNOSIS  °A diagnosis is based on your history, symptoms, physical and neurologic examinations, and diagnostic tests. Diagnostic tests may include: °· Measurement of your blood pressure, pulse, breathing, and oxygen levels (vital signs). °· Blood tests. °· Urine tests. °· X-ray exams. °· A computerized magnetic scan (magnetic resonance imaging, MRI). °· A computerized X-ray scan (computed tomography, CT scan). °TREATMENT  °Treatment will depend on the cause. Treatment may include: °· Management of an underlying medical or mental health condition. °· Critical care or support in the hospital. °HOME CARE INSTRUCTIONS  °· Only take over-the-counter or prescription medicines for pain, discomfort, or fever as directed by your caregiver. °· Manage underlying conditions as directed by your caregiver. °· Eat a healthy, well-balanced diet to maintain strength. °· Join a support group or  prevention program to cope with the condition or trauma that caused the altered mental status. Ask your caregiver to help choose a program that works for you. °· Follow up with your caregiver for further examination, therapy, or testing as directed. °SEEK MEDICAL CARE IF:  °· You feel unwell or have chills. °· You or your family notice a change in your behavior or your alertness. °· You have trouble following your caregiver's treatment plan. °· You have questions or concerns. °SEEK IMMEDIATE MEDICAL CARE IF:  °· You have a rapid heartbeat or have chest pain. °· You have difficulty breathing. °· You have a fever. °· You have a headache with a stiff neck. °· You cough up blood. °· You have blood in your urine or stool. °· You have severe agitation or confusion. °MAKE SURE YOU:  °· Understand these instructions. °· Will watch your condition. °· Will get help right away if you are not doing well or get worse. °Document Released: 03/23/2010 Document Revised: 12/26/2011 Document Reviewed: 03/23/2010 °ExitCare® Patient Information ©2014 [employer]. ° °

## 2014-01-26 LAB — CULTURE, BLOOD (ROUTINE X 2)
CULTURE: NO GROWTH
Culture: NO GROWTH

## 2014-02-17 ENCOUNTER — Encounter (HOSPITAL_BASED_OUTPATIENT_CLINIC_OR_DEPARTMENT_OTHER): Payer: PRIVATE HEALTH INSURANCE | Attending: Plastic Surgery

## 2014-02-19 ENCOUNTER — Encounter (HOSPITAL_COMMUNITY): Payer: Self-pay | Admitting: Emergency Medicine

## 2014-02-19 ENCOUNTER — Emergency Department (HOSPITAL_COMMUNITY): Payer: Medicare Other

## 2014-02-19 ENCOUNTER — Inpatient Hospital Stay (HOSPITAL_COMMUNITY)
Admission: EM | Admit: 2014-02-19 | Discharge: 2014-02-23 | DRG: 698 | Disposition: A | Discharge status: Skilled Nursing Facility | Payer: Medicare Other | Attending: Internal Medicine | Admitting: Internal Medicine

## 2014-02-19 ENCOUNTER — Inpatient Hospital Stay (HOSPITAL_COMMUNITY): Payer: Medicare Other

## 2014-02-19 DIAGNOSIS — L8992 Pressure ulcer of unspecified site, stage 2: Secondary | ICD-10-CM | POA: Diagnosis present

## 2014-02-19 DIAGNOSIS — L89153 Pressure ulcer of sacral region, stage 3: Secondary | ICD-10-CM

## 2014-02-19 DIAGNOSIS — L89609 Pressure ulcer of unspecified heel, unspecified stage: Secondary | ICD-10-CM | POA: Diagnosis present

## 2014-02-19 DIAGNOSIS — T83511A Infection and inflammatory reaction due to indwelling urethral catheter, initial encounter: Secondary | ICD-10-CM | POA: Diagnosis not present

## 2014-02-19 DIAGNOSIS — L8993 Pressure ulcer of unspecified site, stage 3: Secondary | ICD-10-CM | POA: Diagnosis present

## 2014-02-19 DIAGNOSIS — L8995 Pressure ulcer of unspecified site, unstageable: Secondary | ICD-10-CM | POA: Diagnosis present

## 2014-02-19 DIAGNOSIS — E785 Hyperlipidemia, unspecified: Secondary | ICD-10-CM | POA: Diagnosis present

## 2014-02-19 DIAGNOSIS — Y92009 Unspecified place in unspecified non-institutional (private) residence as the place of occurrence of the external cause: Secondary | ICD-10-CM | POA: Diagnosis not present

## 2014-02-19 DIAGNOSIS — J189 Pneumonia, unspecified organism: Secondary | ICD-10-CM

## 2014-02-19 DIAGNOSIS — D649 Anemia, unspecified: Secondary | ICD-10-CM | POA: Diagnosis present

## 2014-02-19 DIAGNOSIS — N390 Urinary tract infection, site not specified: Secondary | ICD-10-CM

## 2014-02-19 DIAGNOSIS — M79605 Pain in left leg: Secondary | ICD-10-CM | POA: Diagnosis present

## 2014-02-19 DIAGNOSIS — I69959 Hemiplegia and hemiparesis following unspecified cerebrovascular disease affecting unspecified side: Secondary | ICD-10-CM | POA: Diagnosis not present

## 2014-02-19 DIAGNOSIS — R7401 Elevation of levels of liver transaminase levels: Secondary | ICD-10-CM

## 2014-02-19 DIAGNOSIS — I4891 Unspecified atrial fibrillation: Secondary | ICD-10-CM

## 2014-02-19 DIAGNOSIS — L89109 Pressure ulcer of unspecified part of back, unspecified stage: Secondary | ICD-10-CM

## 2014-02-19 DIAGNOSIS — M79609 Pain in unspecified limb: Secondary | ICD-10-CM | POA: Diagnosis not present

## 2014-02-19 DIAGNOSIS — Z87891 Personal history of nicotine dependence: Secondary | ICD-10-CM | POA: Diagnosis not present

## 2014-02-19 DIAGNOSIS — E119 Type 2 diabetes mellitus without complications: Secondary | ICD-10-CM | POA: Diagnosis present

## 2014-02-19 DIAGNOSIS — I10 Essential (primary) hypertension: Secondary | ICD-10-CM | POA: Diagnosis present

## 2014-02-19 DIAGNOSIS — D72829 Elevated white blood cell count, unspecified: Secondary | ICD-10-CM

## 2014-02-19 DIAGNOSIS — L89309 Pressure ulcer of unspecified buttock, unspecified stage: Secondary | ICD-10-CM | POA: Diagnosis present

## 2014-02-19 DIAGNOSIS — G9340 Encephalopathy, unspecified: Secondary | ICD-10-CM

## 2014-02-19 DIAGNOSIS — R740 Nonspecific elevation of levels of transaminase and lactic acid dehydrogenase [LDH]: Secondary | ICD-10-CM

## 2014-02-19 DIAGNOSIS — L089 Local infection of the skin and subcutaneous tissue, unspecified: Secondary | ICD-10-CM

## 2014-02-19 DIAGNOSIS — T148XXA Other injury of unspecified body region, initial encounter: Secondary | ICD-10-CM

## 2014-02-19 DIAGNOSIS — Y846 Urinary catheterization as the cause of abnormal reaction of the patient, or of later complication, without mention of misadventure at the time of the procedure: Secondary | ICD-10-CM | POA: Diagnosis present

## 2014-02-19 DIAGNOSIS — R1310 Dysphagia, unspecified: Secondary | ICD-10-CM | POA: Diagnosis present

## 2014-02-19 DIAGNOSIS — E860 Dehydration: Secondary | ICD-10-CM | POA: Diagnosis present

## 2014-02-19 HISTORY — DX: Unspecified osteoarthritis, unspecified site: M19.90

## 2014-02-19 HISTORY — DX: Presence of urogenital implants: Z96.0

## 2014-02-19 HISTORY — DX: Presence of other specified devices: Z97.8

## 2014-02-19 LAB — GLUCOSE, CAPILLARY: Glucose-Capillary: 160 mg/dL — ABNORMAL HIGH (ref 70–99)

## 2014-02-19 LAB — CBC WITH DIFFERENTIAL/PLATELET
Basophils Absolute: 0 10*3/uL (ref 0.0–0.1)
Basophils Relative: 0 % (ref 0–1)
Eosinophils Absolute: 0.2 10*3/uL (ref 0.0–0.7)
Eosinophils Relative: 1 % (ref 0–5)
HEMATOCRIT: 32.8 % — AB (ref 39.0–52.0)
Hemoglobin: 10.9 g/dL — ABNORMAL LOW (ref 13.0–17.0)
LYMPHS PCT: 23 % (ref 12–46)
Lymphs Abs: 3 10*3/uL (ref 0.7–4.0)
MCH: 30.4 pg (ref 26.0–34.0)
MCHC: 33.2 g/dL (ref 30.0–36.0)
MCV: 91.6 fL (ref 78.0–100.0)
MONOS PCT: 11 % (ref 3–12)
Monocytes Absolute: 1.4 10*3/uL — ABNORMAL HIGH (ref 0.1–1.0)
Neutro Abs: 8.6 10*3/uL — ABNORMAL HIGH (ref 1.7–7.7)
Neutrophils Relative %: 65 % (ref 43–77)
Platelets: 540 10*3/uL — ABNORMAL HIGH (ref 150–400)
RBC: 3.58 MIL/uL — AB (ref 4.22–5.81)
RDW: 13.6 % (ref 11.5–15.5)
WBC: 13.2 10*3/uL — AB (ref 4.0–10.5)

## 2014-02-19 LAB — URINALYSIS, ROUTINE W REFLEX MICROSCOPIC
Bilirubin Urine: NEGATIVE
Glucose, UA: NEGATIVE mg/dL
KETONES UR: NEGATIVE mg/dL
NITRITE: NEGATIVE
Protein, ur: 30 mg/dL — AB
SPECIFIC GRAVITY, URINE: 1.016 (ref 1.005–1.030)
UROBILINOGEN UA: 1 mg/dL (ref 0.0–1.0)
pH: 5 (ref 5.0–8.0)

## 2014-02-19 LAB — BASIC METABOLIC PANEL
BUN: 18 mg/dL (ref 6–23)
CO2: 21 mEq/L (ref 19–32)
Calcium: 9.4 mg/dL (ref 8.4–10.5)
Chloride: 103 mEq/L (ref 96–112)
Creatinine, Ser: 0.82 mg/dL (ref 0.50–1.35)
GFR calc Af Amer: >90 mL/min (ref >90)
GFR, EST NON AFRICAN AMERICAN: 80 mL/min — AB (ref >90)
Glucose, Bld: 122 mg/dL — ABNORMAL HIGH (ref 70–99)
POTASSIUM: 4.3 meq/L (ref 3.7–5.3)
Sodium: 137 mEq/L (ref 137–147)

## 2014-02-19 LAB — HEPATIC FUNCTION PANEL
ALBUMIN: 2.8 g/dL — AB (ref 3.5–5.2)
ALT: 20 U/L (ref 0–53)
AST: 22 U/L (ref 0–37)
Alkaline Phosphatase: 152 U/L — ABNORMAL HIGH (ref 39–117)
Bilirubin, Direct: <0.2 mg/dL (ref 0.0–0.3)
TOTAL PROTEIN: 7.6 g/dL (ref 6.0–8.3)
Total Bilirubin: 0.4 mg/dL (ref 0.3–1.2)

## 2014-02-19 LAB — URINE MICROSCOPIC-ADD ON

## 2014-02-19 LAB — AMMONIA: AMMONIA: 30 umol/L (ref 11–60)

## 2014-02-19 LAB — MRSA PCR SCREENING: MRSA BY PCR: NEGATIVE

## 2014-02-19 MED ORDER — IPRATROPIUM-ALBUTEROL 0.5-2.5 (3) MG/3ML IN SOLN: 3.0000 mL | RESPIRATORY_TRACT | Status: DC | PRN

## 2014-02-19 MED ORDER — HYDROMORPHONE HCL PF 1 MG/ML IJ SOLN
0.5000 mg | Freq: Two times a day (BID) | INTRAMUSCULAR | Status: DC | PRN
  Administered 2014-02-21: 0.5 mg via INTRAVENOUS
  Filled 2014-02-19: qty 1

## 2014-02-19 MED ORDER — AMLODIPINE BESYLATE 10 MG PO TABS
10.0000 mg | ORAL_TABLET | Freq: Every day | ORAL | Status: DC
  Administered 2014-02-20 – 2014-02-23 (×4): 10 mg via ORAL
  Filled 2014-02-19 (×5): qty 1

## 2014-02-19 MED ORDER — POLYETHYLENE GLYCOL 3350 17 G PO PACK
17.0000 g | PACK | Freq: Every day | ORAL | Status: DC
  Administered 2014-02-20 – 2014-02-23 (×4): 17 g via ORAL
  Filled 2014-02-19 (×5): qty 1

## 2014-02-19 MED ORDER — ATORVASTATIN CALCIUM 40 MG PO TABS
40.0000 mg | ORAL_TABLET | Freq: Every day | ORAL | Status: DC
  Administered 2014-02-19 – 2014-02-22 (×4): 40 mg via ORAL
  Filled 2014-02-19 (×7): qty 1

## 2014-02-19 MED ORDER — PIPERACILLIN-TAZOBACTAM 3.375 G IVPB 30 MIN
3.3750 g | Freq: Once | INTRAVENOUS | Status: AC
  Administered 2014-02-19: 3.375 g via INTRAVENOUS
  Filled 2014-02-19: qty 50

## 2014-02-19 MED ORDER — LIDOCAINE 5 % EX PTCH
1.0000 | MEDICATED_PATCH | CUTANEOUS | Status: DC
  Administered 2014-02-20 – 2014-02-23 (×4): 1 via TRANSDERMAL
  Filled 2014-02-19 (×7): qty 1

## 2014-02-19 MED ORDER — HYDROCODONE-ACETAMINOPHEN 5-325 MG PO TABS
1.0000 | ORAL_TABLET | Freq: Four times a day (QID) | ORAL | Status: DC | PRN
  Administered 2014-02-19 – 2014-02-20 (×2): 1 via ORAL
  Filled 2014-02-19 (×2): qty 1

## 2014-02-19 MED ORDER — SENNOSIDES-DOCUSATE SODIUM 8.6-50 MG PO TABS
2.0000 | ORAL_TABLET | Freq: Two times a day (BID) | ORAL | Status: DC
  Administered 2014-02-19 – 2014-02-23 (×8): 2 via ORAL
  Filled 2014-02-19 (×12): qty 2

## 2014-02-19 MED ORDER — INSULIN ASPART 100 UNIT/ML ~~LOC~~ SOLN
0.0000 [IU] | Freq: Three times a day (TID) | SUBCUTANEOUS | Status: DC
  Administered 2014-02-20 – 2014-02-23 (×6): 1 [IU] via SUBCUTANEOUS

## 2014-02-19 MED ORDER — ONDANSETRON HCL 4 MG PO TABS
4.0000 mg | ORAL_TABLET | Freq: Four times a day (QID) | ORAL | Status: DC | PRN
Start: 2014-02-19 — End: 2014-02-23

## 2014-02-19 MED ORDER — SODIUM CHLORIDE 0.9 % IV BOLUS (SEPSIS)
1000.0000 mL | INTRAVENOUS | Status: AC
  Administered 2014-02-19: 1000 mL via INTRAVENOUS

## 2014-02-19 MED ORDER — SODIUM CHLORIDE 0.9 % IV SOLN
INTRAVENOUS | Status: AC
  Administered 2014-02-19 (×2): via INTRAVENOUS

## 2014-02-19 MED ORDER — DEXTROSE 5 % IV SOLN
1.0000 g | Freq: Three times a day (TID) | INTRAVENOUS | Status: DC
  Administered 2014-02-19 – 2014-02-21 (×6): 1 g via INTRAVENOUS
  Filled 2014-02-19 (×7): qty 1

## 2014-02-19 MED ORDER — ONDANSETRON HCL 4 MG/2ML IJ SOLN: 4.0000 mg | Freq: Four times a day (QID) | INTRAMUSCULAR | Status: DC | PRN

## 2014-02-19 MED ORDER — VANCOMYCIN HCL 10 G IV SOLR
1250.0000 mg | Freq: Once | INTRAVENOUS | Status: AC
  Administered 2014-02-19: 1250 mg via INTRAVENOUS
  Filled 2014-02-19 (×2): qty 1250

## 2014-02-19 MED ORDER — APIXABAN 5 MG PO TABS
5.0000 mg | ORAL_TABLET | Freq: Two times a day (BID) | ORAL | Status: DC
  Administered 2014-02-19 – 2014-02-23 (×8): 5 mg via ORAL
  Filled 2014-02-19 (×10): qty 1

## 2014-02-19 MED ORDER — TRAMADOL HCL 50 MG PO TABS
25.0000 mg | ORAL_TABLET | Freq: Three times a day (TID) | ORAL | Status: DC
  Administered 2014-02-19 – 2014-02-23 (×12): 25 mg via ORAL
  Filled 2014-02-19 (×13): qty 1

## 2014-02-19 MED ORDER — ACETAMINOPHEN 325 MG PO TABS: 650.0000 mg | ORAL_TABLET | Freq: Four times a day (QID) | ORAL | Status: DC | PRN

## 2014-02-19 MED ORDER — BRINZOLAMIDE 1 % OP SUSP
1.0000 [drp] | Freq: Two times a day (BID) | OPHTHALMIC | Status: DC
  Administered 2014-02-19 – 2014-02-23 (×8): 1 [drp] via OPHTHALMIC
  Filled 2014-02-19 (×2): qty 10

## 2014-02-19 MED ORDER — TRAVOPROST 0.004 % OP SOLN: 1.0000 [drp] | Freq: Every day | OPHTHALMIC | Status: DC

## 2014-02-19 MED ORDER — CARVEDILOL 12.5 MG PO TABS
12.5000 mg | ORAL_TABLET | Freq: Two times a day (BID) | ORAL | Status: DC
  Administered 2014-02-19 – 2014-02-23 (×9): 12.5 mg via ORAL
  Filled 2014-02-19 (×14): qty 1

## 2014-02-19 MED ORDER — LATANOPROST 0.005 % OP SOLN
1.0000 [drp] | Freq: Every day | OPHTHALMIC | Status: DC
  Administered 2014-02-19 – 2014-02-22 (×4): 1 [drp] via OPHTHALMIC
  Filled 2014-02-19 (×2): qty 2.5

## 2014-02-19 MED ORDER — ACETAMINOPHEN 650 MG RE SUPP: 650.0000 mg | Freq: Four times a day (QID) | RECTAL | Status: DC | PRN

## 2014-02-19 MED ORDER — VANCOMYCIN HCL IN DEXTROSE 1-5 GM/200ML-% IV SOLN
1000.0000 mg | Freq: Two times a day (BID) | INTRAVENOUS | Status: DC
  Administered 2014-02-20 – 2014-02-22 (×4): 1000 mg via INTRAVENOUS
  Filled 2014-02-19 (×6): qty 200

## 2014-02-19 NOTE — Progress Notes (Signed)
ANTIBIOTIC CONSULT NOTE - INITIAL  Pharmacy Consult for vancomycin and cefepime Indication: sacral wound infection and UTI  No Known Allergies  Patient Measurements: Height: 5\' 11"  (180.3 cm) Weight: 164 lb 10.9 oz (74.7 kg) IBW/kg (Calculated) : 75.3 Adjusted Body Weight:   Vital Signs: Temp: 98.7 F (37.1 C) (05/06 1747) Temp src: Oral (05/06 1747) BP: 149/84 mmHg (05/06 1747) Pulse Rate: 85 (05/06 1747) Intake/Output from previous day:   Intake/Output from this shift: Total I/O In: 1000 [I.V.:1000] Out: -   Labs:  Recent Labs  02/19/14 1304  WBC 13.2*  HGB 10.9*  PLT 540*  CREATININE 0.82   Estimated Creatinine Clearance: 72.1 ml/min (by C-G formula based on Cr of 0.82). No results found for this basename: VANCOTROUGH, VANCOPEAK, VANCORANDOM, GENTTROUGH, GENTPEAK, GENTRANDOM, TOBRATROUGH, TOBRAPEAK, TOBRARND, AMIKACINPEAK, AMIKACINTROU, AMIKACIN,  in the last 72 hours   Microbiology: No results found for this or any previous visit (from the past 720 hour(s)).  Medical History: Past Medical History  Diagnosis Date  . Stroke   . A-fib   . Diabetes mellitus without complication   . Hypertension   . Rib fracture   . CVA (cerebral infarction)   . Encephalopathy   . Pneumonia   . Hypercholesterolemia     Medications:  Scheduled:  . [START ON 02/20/2014] amLODipine  10 mg Oral Daily  . apixaban  5 mg Oral BID  . atorvastatin  40 mg Oral QHS  . brinzolamide  1 drop Both Eyes BID  . carvedilol  12.5 mg Oral BID WC  . [START ON 02/20/2014] insulin aspart  0-9 Units Subcutaneous TID WC  . latanoprost  1 drop Both Eyes QHS  . [START ON 02/20/2014] lidocaine  1 patch Transdermal Q24H  . [START ON 02/20/2014] polyethylene glycol  17 g Oral Daily  . senna-docusate  2 tablet Oral BID  . traMADol  25 mg Oral 3 times per day   Infusions:  . sodium chloride 75 mL/hr at 02/19/14 1829   Assessment: 80 yo male with UTI and sacral wound infection will be started on  vancomycin and cefepime.  CrCl ~72.  Patient received vancomycin 1250mg  iv x1 dose at 1653 on 02/19/14  Goal of Therapy:  Vancomycin trough level 15-20 mcg/ml  Plan:  1) Vancomycin 1g iv q12h, 1st dose at 0600 on 02/20/14 and cefepime 1g iv q8h 2) Monitor culture and sensitivity and plan on antibiotic before checking vancomycin trough  Tsz-Yin Jamani Bearce 02/19/2014,6:30 PM

## 2014-02-19 NOTE — ED Notes (Signed)
Family states multiple complaints. Pt recently suffered from stroke with L sided deficits. Since CVA pt was placed in NH. Now states L knee pain, soreness all over body. Also states pressure ulcers to sacrum and L thigh region. Pt also c/o lower abdominal pain, urinary catheter present upon arrival to ED, thick yellow drainage noted from catheter.

## 2014-02-19 NOTE — ED Notes (Signed)
[facility] with bilateral lower extremity pain since his CVA apprx a month ago Left sided weakness. Palpable pulses, A/O, VSS. Family is who wanted patient transported to the ER.

## 2014-02-19 NOTE — ED Notes (Signed)
Pt had foley catheter in place upon arrival to ED from NH, foley to be removed and replaced with new catheter per ED PA

## 2014-02-19 NOTE — ED Notes (Signed)
Foley catheter flushed with 30ml of sterile water. No urine returned. ED PA notified.

## 2014-02-19 NOTE — ED Notes (Signed)
Bladder scan performed, 0ml noted. ED PA notified.

## 2014-02-19 NOTE — H&P (Addendum)
History and Physical  Joel [name] ZOX:[id number] DOB: 06/19/1928 DOA: 02/19/2014  Referring physician: EDP PCP: No PCP Per Patient  Outpatient Specialists:  1. Urology: ? 2. Orthopedics: [facility].  Chief Complaint: Abdominal pain,? Left lower extremity versus generalized pain, bedsore.  HPI: Joel Favaeter Pantaleo is a 80 y.o. male with history of CVA (diagnosed mid March 2015 and [facility] residual left hemiplegia & dysphagia, nonambulatory for the last 2-3 weeks, A. fib on Apixaban, type II DM, HTN, indwelling Foley catheter since March 2015, rib fracture, hypercholesterolemia, presented to the ED from SNF with above complaints. History obtained from patient (not a good historian) and held his daughter Rinaldo Cloudamela at bedside. For the last couple of weeks patient apparently has been having intermittent lower abdominal pain, worsened during urination, not associated with fever, chills, or Reiter's, nausea, vomiting or diarrhea. He is unable to quantify severity of pain or radiation. Appetite apparently has been good but he has not been drinking much fluids. He was seen by a urologist and we do not have information about diagnoses that was provided. No antibiotics were started. He also gives 2-3 week history of left lower extremity pain. According to daughter, left lower extremity was folded and stretching it brought on pain. He was [facility] Penn[facility] and was told to have arthritis and was planning to get CT? Of knee. No reported history of falls recently. Patient however does not give a clear history localizing site of pain and states that he has pain almost everywhere. He was about to be discharged from skilled nursing facility to home when her family noticed a large bedsore in the sacral region with foul-smelling drainage and patient complained of pain at that site. Patient denies headache, earache, sore throat, chest pain, cough or dyspnea. In the ED, Foley  catheter apparently had green pus, WBC mildly elevated, chest x-ray negative, hemoglobin 10.9. Hospitalist admission requested.   Review of Systems: All systems reviewed and apart from history of presenting illness, are negative.  Past Medical History  Diagnosis Date  . Stroke   . A-fib   . Diabetes mellitus without complication   . Hypertension   . Rib fracture   . CVA (cerebral infarction)   . Encephalopathy   . Pneumonia   . Hypercholesterolemia    History reviewed. No pertinent past surgical history. Social History:  reports that he quit smoking about 46 years ago. His smoking use included Cigars and Pipe. He has never used smokeless tobacco. He reports that he does not drink alcohol or use illicit drugs. Widowed. Use to live alone and was independent of activities of daily living until his stroke in mid March. Since then he has been hospitalized 3 times before and [facility].  No Known Allergies  History reviewed. No pertinent family history. negative family history.  Prior to Admission medications   Medication Sig Start Date End Date Taking? Authorizing Provider  acetaminophen (TYLENOL) 325 MG tablet Take 975 mg by mouth 3 (three) times daily.   Yes Historical Provider, MD  amLODipine (NORVASC) 10 MG tablet Take 10 mg by mouth daily.   Yes Historical Provider, MD  apixaban (ELIQUIS) 5 MG TABS tablet Take 5 mg by mouth 2 (two) times daily.   Yes Historical Provider, MD  atorvastatin (LIPITOR) 40 MG tablet Take 40 mg by mouth at bedtime.    Yes Historical Provider, MD  brinzolamide (AZOPT) 1 % ophthalmic suspension Place 1 drop into both  eyes 2 (two) times daily.   Yes Historical Provider, MD  Brinzolamide-Brimonidine 1-0.2 % SUSP Place 1 drop into both eyes 3 (three) times daily.   Yes Historical Provider, MD  carvedilol (COREG) 12.5 MG tablet Take 12.5 mg by mouth 2 (two) times daily with a meal.   Yes Historical Provider, MD  indomethacin (INDOCIN)  50 MG capsule Take 1 capsule (50 mg total) by mouth 3 (three) times daily as needed for mild pain or moderate pain. 01/22/14  Yes Maryann Mikhail, DO  ipratropium-albuterol (DUONEB) 0.5-2.5 (3) MG/3ML SOLN Take 3 mLs by nebulization every 4 (four) hours as needed (shortness of breath).   Yes Historical Provider, MD  lidocaine (LIDODERM) 5 % Place 1 patch onto the skin daily. Remove & Discard patch within 12 hours or as directed by MD   Yes Historical Provider, MD  polyethylene glycol (MIRALAX / GLYCOLAX) packet Take 17 g by mouth daily.   Yes Historical Provider, MD  sennosides-docusate sodium (SENOKOT-S) 8.6-50 MG tablet Take 2 tablets by mouth 2 (two) times daily.   Yes Historical Provider, MD  traMADol (ULTRAM) 50 MG tablet Take 25 mg by mouth every 8 (eight) hours.   Yes Historical Provider, MD  travoprost, benzalkonium, (TRAVATAN) 0.004 % ophthalmic solution Place 1 drop into both eyes at bedtime.   Yes Historical Provider, MD   Physical Exam: Filed Vitals:   02/19/14 1250 02/19/14 1512 02/19/14 1654 02/19/14 1747  BP: 116/55 121/86 120/87 149/84  Pulse: 94 86 88 85  Temp: 98.7 F (37.1 C)   98.7 F (37.1 C)  TempSrc: Oral   Oral  Resp: 18 18 18 18   Height:    5\' 11"  (1.803 m)  Weight:    74.7 kg (164 lb 10.9 oz)  SpO2: 98% 99% 96% 100%     General exam: Moderately built and thinly nourished, chronically ill-looking elderly male, lying comfortably supine on the gurney in no obvious distress.  Head, eyes and ENT: Nontraumatic and normocephalic. Pupils equally reacting to light and accommodation. Oral mucosa dry.  Neck: Supple. No JVD, carotid bruit or thyromegaly.  Lymphatics: No lymphadenopathy.  Respiratory system: Clear to auscultation. No increased work of breathing.  Cardiovascular system: S1 and S2 heard, RRR. No JVD, murmurs, gallops, clicks or pedal edema.  Gastrointestinal system: Abdomen is nondistended, soft . Mild suprapubic tenderness without peritoneal signs. Foley  +. Normal bowel sounds heard. No organomegaly or masses appreciated.  Central nervous system: Alert and oriented x2. No focal neurological deficits.  Extremities: Peripheral pulses symmetrically felt. Patient seems to have difficulty moving all extremities secondary to pain. Contractures of left upper extremity. Right upper extremity power at least 4 x 5. Left upper extremity and bilateral lower extremity power at least 2 x 5. Both knees have chronic boggy swelling without acute signs. Touching any part of his lower extremities or movement seems to precipitate pain. Occasional spasm of left lower extremity noted.  Skin: Approximately 0.5 cm stage II ulcer over left hip with minimal slough at the base. Approximately 3 cm diameter stage III mid sacral decubitus with mild purulent drainage and slough at the base. No fluctuance.  Musculoskeletal system: Negative exam.  Psychiatry: Pleasant and cooperative.   Labs on Admission:  Basic Metabolic Panel:  Recent Labs Lab 02/19/14 1304  NA 137  K 4.3  CL 103  CO2 21  GLUCOSE 122*  BUN 18  CREATININE 0.82  CALCIUM 9.4   Liver Function Tests:  Recent Labs Lab 02/19/14 1439  AST 22  ALT 20  ALKPHOS 152*  BILITOT 0.4  PROT 7.6  ALBUMIN 2.8*   No results found for this basename: LIPASE, AMYLASE,  in the last 168 hours  Recent Labs Lab 02/19/14 1439  AMMONIA 30   CBC:  Recent Labs Lab 02/19/14 1304  WBC 13.2*  NEUTROABS 8.6*  HGB 10.9*  HCT 32.8*  MCV 91.6  PLT 540*   Cardiac Enzymes: No results found for this basename: CKTOTAL, CKMB, CKMBINDEX, TROPONINI,  in the last 168 hours  BNP (last 3 results) No results found for this basename: PROBNP,  in the last 8760 hours CBG: No results found for this basename: GLUCAP,  in the last 168 hours  Radiological Exams on Admission: Dg Chest Portable 1 View  02/19/2014   CLINICAL DATA:  Chest pain  EXAM: PORTABLE CHEST - 1 VIEW  COMPARISON:  01/22/2014  FINDINGS: The heart  size and mediastinal contours are within normal limits. Both lungs are clear. The visualized skeletal structures are unremarkable.  IMPRESSION: No active disease.   Electronically Signed   By: Alcide Clever M.D.   On: 02/19/2014 14:16    EKG: Independently reviewed. Baseline artifact.? A. fib with controlled ventricular rate and no acute changes.  Assessment/Plan Principal Problem:   Complicated UTI (urinary tract infection) Active Problems:   Atrial fibrillation   HTN (hypertension)   Dyslipidemia   Leukocytosis   Sacral decubitus ulcer, stage III   Left leg pain   Diabetes mellitus without complication   1. Complicated UTI/indwelling Foley catheter/abdominal pain: IV cefepime per pharmacy which should cover the UTI and infected sacral decubitus ulcer. If no fever, after urine culture results are available, recommend rapidly de-escalating antibiotics. 2. Infected sacral decubitus ulcer: Wound care consultation. IV cefepime per pharmacy. 3. Mild dehydration: Brief IV fluids. 4. CVA with residual left hemiplegia and dysphagia: Continue anticoagulation. 5. Left lower extremity/? Knee pain/generalized pain:? Neuropathic pain from CVA.? Arthritic pain. Will check x-ray of left knee and pelvis to rule out fractures. May consider discussing with patient's primary orthopedic M.D. in a.m. 6. Atrial fibrillation: Controlled ventricular rate. Continue anticoagulation. Continue carvedilol. 7. Hypertension: Controlled. Continue amlodipine and carvedilol. 8. History of dyslipidemia: 9. Anemia: Stable. Follow CBCs. 10. Type II DM: SSI and monitor CBGs.     Code Status: Full  Family Communication: Discussed with patient's eldest daughter Ms. Rinaldo Cloud at bedside.  Disposition Plan: Return to SNF when medically stable.   Time spent: 65 minutes  Elease Etienne, MD, FACP, [facility]. [facility] Pager [phone]  If 7PM-7AM, please contact night-coverage [email or web address] Password [email or web address] 02/19/2014,  6:05 PM

## 2014-02-19 NOTE — ED Provider Notes (Signed)
CSN: [id number]     Arrival date & time 02/19/14  1240 History   First MD Initiated Contact with Patient 02/19/14 1303     Chief Complaint  Patient presents with  . Leg Pain     (Consider location/radiation/quality/duration/timing/severity/associated sxs/prior Treatment) HPI Comments: Patient is an 80 year old male with a past medical history of stroke 1 month ago, atrial fibrillation, diabetes and hypertension who presents with multiple complaints. History is provided by the patient's 2 daughters who are present at the bedside. Patient was transported from [facility] who presents with bilateral lower extremity pain, infected bed sores, and lower abdominal pain. Patient's lower extremity pain started 1 month ago after he had a stroke. He has residual left sided deficits including left knee pain that has been constant. [facility] his knee pain and is awaiting a knee CT.   Patient's daughters also complain about his worsening bed sores that have been causing the patient discomfort and have a bad odor to them. The nursing home staff states the sores occurred overnight. Patient also has lower abdominal pain. He is unable to characterize the pain. The pain does not radiate. The family is concerned about the care he is receiving in the nursing home.    Past Medical History  Diagnosis Date  . Stroke   . A-fib   . Diabetes mellitus without complication   . Hypertension   . Rib fracture   . CVA (cerebral infarction)   . Encephalopathy   . Pneumonia   . Hypercholesterolemia    History reviewed. No pertinent past surgical history. History reviewed. No pertinent family history. History  Substance Use Topics  . Smoking status: Former Smoker    Types: Cigars, Pipe    Quit date: 06/23/1967  . Smokeless tobacco: Never Used  . Alcohol Use: No    Review of Systems  Constitutional: Negative for fever, chills and fatigue.  HENT: Negative for  trouble swallowing.   Eyes: Negative for visual disturbance.  Respiratory: Negative for shortness of breath.   Cardiovascular: Negative for chest pain and palpitations.  Gastrointestinal: Positive for abdominal pain. Negative for nausea, vomiting and diarrhea.  Genitourinary: Negative for dysuria and difficulty urinating.  Musculoskeletal: Positive for arthralgias and myalgias. Negative for neck pain.  Skin: Positive for wound. Negative for color change.  Neurological: Negative for dizziness and weakness.  Psychiatric/Behavioral: Negative for dysphoric mood.      Allergies  Review of patient's allergies indicates no known allergies.  Home Medications   Prior to Admission medications   Medication Sig Start Date End Date Taking? Authorizing Provider  acetaminophen (TYLENOL) 325 MG tablet Take 975 mg by mouth 3 (three) times daily.   Yes Historical Provider, MD  amLODipine (NORVASC) 10 MG tablet Take 10 mg by mouth daily.   Yes Historical Provider, MD  apixaban (ELIQUIS) 5 MG TABS tablet Take 5 mg by mouth 2 (two) times daily.   Yes Historical Provider, MD  atorvastatin (LIPITOR) 40 MG tablet Take 40 mg by mouth at bedtime.    Yes Historical Provider, MD  brinzolamide (AZOPT) 1 % ophthalmic suspension Place 1 drop into both eyes 2 (two) times daily.   Yes Historical Provider, MD  Brinzolamide-Brimonidine 1-0.2 % SUSP Place 1 drop into both eyes 3 (three) times daily.   Yes Historical Provider, MD  carvedilol (COREG) 12.5 MG tablet Take 12.5 mg by mouth 2 (two) times daily with a meal.   Yes Historical Provider,  MD  indomethacin (INDOCIN) 50 MG capsule Take 1 capsule (50 mg total) by mouth 3 (three) times daily as needed for mild pain or moderate pain. 01/22/14  Yes Maryann Mikhail, DO  ipratropium-albuterol (DUONEB) 0.5-2.5 (3) MG/3ML SOLN Take 3 mLs by nebulization every 4 (four) hours as needed (shortness of breath).   Yes Historical Provider, MD  lidocaine (LIDODERM) 5 % Place 1 patch  onto the skin daily. Remove & Discard patch within 12 hours or as directed by MD   Yes Historical Provider, MD  polyethylene glycol (MIRALAX / GLYCOLAX) packet Take 17 g by mouth daily.   Yes Historical Provider, MD  sennosides-docusate sodium (SENOKOT-S) 8.6-50 MG tablet Take 2 tablets by mouth 2 (two) times daily.   Yes Historical Provider, MD  traMADol (ULTRAM) 50 MG tablet Take 25 mg by mouth every 8 (eight) hours.   Yes Historical Provider, MD  travoprost, benzalkonium, (TRAVATAN) 0.004 % ophthalmic solution Place 1 drop into both eyes at bedtime.   Yes Historical Provider, MD   BP 116/55  Pulse 94  Temp(Src) 98.7 F (37.1 C) (Oral)  Resp 18  SpO2 98% Physical Exam  Nursing note and vitals reviewed. Constitutional: He is oriented to person, place, and time. He appears well-developed and well-nourished. No distress.  HENT:  Head: Normocephalic and atraumatic.  Eyes: Conjunctivae and EOM are normal.  Neck: Normal range of motion.  Cardiovascular: Normal rate and regular rhythm.  Exam reveals no gallop and no friction rub.   No murmur heard. Pulmonary/Chest: Effort normal and breath sounds normal. He has no wheezes. He has no rales. He exhibits no tenderness.  Abdominal: Soft. He exhibits no distension. There is tenderness. There is no rebound and no guarding.  Lower abdominal tenderness to palpation. No focal tenderness to palpation. No peritoneal signs.   Musculoskeletal:  Limited ROM of left knee to pain. No obvious deformity. Patient keeps knee in a flexed position.   Neurological: He is alert and oriented to person, place, and time. Coordination normal.  Left sided weakness from previous stroke.   Skin: Skin is warm and dry.  3x3cm stage 3 sacral sore that is malodorous. 2x2cm stage 2 pressure sore on left buttock.   Psychiatric: He has a normal mood and affect. His behavior is normal.    ED Course  Procedures (including critical care time)   Date: 02/19/2014  Rate: 53   Rhythm: sinus bradycardia  QRS Axis: normal  Intervals: normal  ST/T Wave abnormalities: normal  Conduction Disutrbances:none  Narrative Interpretation: NSR without acute changes from previous  Old EKG Reviewed: unchanged    Labs Review Labs Reviewed  CBC WITH DIFFERENTIAL - Abnormal; Notable for the following:    WBC 13.2 (*)    RBC 3.58 (*)    Hemoglobin 10.9 (*)    HCT 32.8 (*)    Platelets 540 (*)    Neutro Abs 8.6 (*)    Monocytes Absolute 1.4 (*)    All other components within normal limits  BASIC METABOLIC PANEL - Abnormal; Notable for the following:    Glucose, Bld 122 (*)    GFR calc non Af Amer 80 (*)    All other components within normal limits  URINALYSIS, ROUTINE W REFLEX MICROSCOPIC - Abnormal; Notable for the following:    APPearance CLOUDY (*)    Hgb urine dipstick LARGE (*)    Protein, ur 30 (*)    Leukocytes, UA LARGE (*)    All other components within normal limits  HEPATIC FUNCTION PANEL - Abnormal; Notable for the following:    Albumin 2.8 (*)    Alkaline Phosphatase 152 (*)    All other components within normal limits  URINE MICROSCOPIC-ADD ON - Abnormal; Notable for the following:    Bacteria, UA FEW (*)    All other components within normal limits  URINE CULTURE  AMMONIA    Imaging Review Dg Chest Portable 1 View  02/19/2014   CLINICAL DATA:  Chest pain  EXAM: PORTABLE CHEST - 1 VIEW  COMPARISON:  01/22/2014  FINDINGS: The heart size and mediastinal contours are within normal limits. Both lungs are clear. The visualized skeletal structures are unremarkable.  IMPRESSION: No active disease.   Electronically Signed   By: Alcide CleverMark  Lukens M.D.   On: 02/19/2014 14:16     EKG Interpretation None      MDM   Final diagnoses:  Wound infection  UTI (urinary tract infection)    2:01 PM Labs show elevated WBC. Urinalysis pending. Vitals stable and patient afebrile. Chest xray pending.   3:25 PM Labs show elevated WBC at 13.2 Chest xray  unremarkable for acute changes. Patient's foley catheter produces green pus. Patient also appears to have infected bed wounds.   Patient will be admitted to hospitalist.    Emilia BeckKaitlyn Vandora Jaskulski, PA-C 02/19/14 1622

## 2014-02-20 LAB — CBC
HEMATOCRIT: 27.9 % — AB (ref 39.0–52.0)
Hemoglobin: 9.2 g/dL — ABNORMAL LOW (ref 13.0–17.0)
MCH: 30.4 pg (ref 26.0–34.0)
MCHC: 33 g/dL (ref 30.0–36.0)
MCV: 92.1 fL (ref 78.0–100.0)
Platelets: 443 10*3/uL — ABNORMAL HIGH (ref 150–400)
RBC: 3.03 MIL/uL — AB (ref 4.22–5.81)
RDW: 13.8 % (ref 11.5–15.5)
WBC: 10.5 10*3/uL (ref 4.0–10.5)

## 2014-02-20 LAB — GLUCOSE, CAPILLARY
GLUCOSE-CAPILLARY: 94 mg/dL (ref 70–99)
GLUCOSE-CAPILLARY: 117 mg/dL — AB (ref 70–99)
GLUCOSE-CAPILLARY: 126 mg/dL — AB (ref 70–99)
GLUCOSE-CAPILLARY: 135 mg/dL — AB (ref 70–99)

## 2014-02-20 MED ORDER — COLLAGENASE 250 UNIT/GM EX OINT
TOPICAL_OINTMENT | Freq: Every day | CUTANEOUS | Status: DC
  Administered 2014-02-20 – 2014-02-23 (×4): via TOPICAL
  Filled 2014-02-20 (×2): qty 30

## 2014-02-20 MED ORDER — BIOTENE DRY MOUTH MT LIQD
15.0000 mL | Freq: Two times a day (BID) | OROMUCOSAL | Status: DC
  Administered 2014-02-20 – 2014-02-23 (×7): 15 mL via OROMUCOSAL
  Filled 2014-02-20: qty 15

## 2014-02-20 MED ORDER — HYDROCODONE-ACETAMINOPHEN 5-325 MG PO TABS
0.5000 | ORAL_TABLET | Freq: Three times a day (TID) | ORAL | Status: DC | PRN
  Administered 2014-02-22 – 2014-02-23 (×2): 0.5 via ORAL
  Filled 2014-02-20 (×3): qty 1

## 2014-02-20 NOTE — Progress Notes (Signed)
PROGRESS NOTE    Joel [name] ZOX:[id number] DOB: 10/08/1928 DOA: 02/19/2014 PCP: Georgann Housekeeper, MD  HPI/Brief narrative 80 y.o. male with history of CVA (diagnosed mid March 2015 and admitted at [facility]), residual left hemiplegia & dysphagia, nonambulatory for the last 2-3 weeks, A. fib on Apixaban, type II DM, HTN, indwelling Foley catheter since March 2015, rib fracture, hypercholesterolemia, presented to the ED from SNF with complaints of abdominal pain, LLE vs generalized pain and bedsore.  UA suspicious for UTI.  Assessment/Plan:  1. Complicated UTI/indwelling Foley catheter/abdominal pain: IV cefepime per pharmacy which should cover the UTI and infected sacral decubitus ulcer. If no fever, after urine culture results are available, recommend rapidly de-escalating antibiotics. Spiked fever 101 this morning. Needs to follow up with urology as outpatient regarding management of Foley catheter. 2. Unstageable sacral decubitus ulcer: Wound care consultation appreciated. As discussed with WOC nurse- wounds don't look overly infected and can be managed with topical Rx or oral Abx. 3. Mild dehydration: Brief IV fluids>resolved. 4. CVA with residual left hemiplegia and dysphagia: Continue anticoagulation. 5. Left lower extremity/? Knee pain/generalized pain:? Neuropathic pain from CVA.? Arthritic pain. Pelvic x-rays negative for fractures. X-ray of left knee shows severe arthritic changes. As per daughters, patient apparently was supposed to be nonweightbearing on left lower extremity-unclear reason. Have placed a call to discuss with his orthopedic M.D. Dr. Penni Bombard. Will minimize opioids due to prior history of sedation from same. 6. Atrial fibrillation: Controlled ventricular rate. Continue anticoagulation. Continue carvedilol. 7. Hypertension: Controlled. Continue amlodipine and carvedilol. 8. History of dyslipidemia: 9. Anemia: Slight drop in hemoglobin probably from hemodilution. Follow CBC in  a.m.  10. Type II DM: SSI and monitor CBGs. Family denies history of DM. However hemoglobin A1c in March was 6.8.    Code Status: Full Family Communication: Discussed with patient's 2 daughters at bedside. Disposition Plan: SNF when medically stable.   Consultants:  None  Procedures:  Foley catheter-patient has indwelling Foley catheter which were changed in the ED on 5/6.  Antibiotics:  IV cefepime 5/6 >  IV vancomycin 5/6 >   Subjective: Patient states that he feels better. Complains of mild right lower back pain. States that left leg pain is better controlled.  Objective: Filed Vitals:   02/19/14 1654 02/19/14 1747 02/19/14 2205 02/20/14 0555  BP: 120/87 149/84 124/60 142/64  Pulse: 88 85 83 74  Temp:  98.7 F (37.1 C) 97.9 F (36.6 C) 101 F (38.3 C)  TempSrc:  Oral Oral Oral  Resp: 18 18 20 20   Height:  5\' 11"  (1.803 m)    Weight:  74.7 kg (164 lb 10.9 oz)  77.7 kg (171 lb 4.8 oz)  SpO2: 96% 100% 99% 96%    Intake/Output Summary (Last 24 hours) at 02/20/14 1042 Last data filed at 02/20/14 0557  Gross per 24 hour  Intake   1813 ml  Output    900 ml  Net    913 ml   Filed Weights   02/19/14 1747 02/20/14 0555  Weight: 74.7 kg (164 lb 10.9 oz) 77.7 kg (171 lb 4.8 oz)     Exam:  General exam: Moderately built and thinly nourished male lying comfortably in bed. Respiratory system: Clear. No increased work of breathing. Cardiovascular system: S1 & S2 heard, irregularly irregular. No JVD, murmurs, gallops, clicks or pedal edema. Gastrointestinal system: Abdomen is nondistended, soft and nontender. Normal bowel sounds heard. Foley catheter + Central nervous system: Alert and oriented. No focal neurological deficits. Extremities:  5 x 5 power right upper extremity. 2 x 5 power left upper extremity with possible contractures. Bilateral lower extremity strength evaluation is limited secondary to pain but no acute findings to explain pain.   Data  Reviewed: Basic Metabolic Panel:  Recent Labs Lab 02/19/14 1304  NA 137  K 4.3  CL 103  CO2 21  GLUCOSE 122*  BUN 18  CREATININE 0.82  CALCIUM 9.4   Liver Function Tests:  Recent Labs Lab 02/19/14 1439  AST 22  ALT 20  ALKPHOS 152*  BILITOT 0.4  PROT 7.6  ALBUMIN 2.8*   No results found for this basename: LIPASE, AMYLASE,  in the last 168 hours  Recent Labs Lab 02/19/14 1439  AMMONIA 30   CBC:  Recent Labs Lab 02/19/14 1304 02/20/14 0542  WBC 13.2* 10.5  NEUTROABS 8.6*  --   HGB 10.9* 9.2*  HCT 32.8* 27.9*  MCV 91.6 92.1  PLT 540* 443*   Cardiac Enzymes: No results found for this basename: CKTOTAL, CKMB, CKMBINDEX, TROPONINI,  in the last 168 hours BNP (last 3 results) No results found for this basename: PROBNP,  in the last 8760 hours CBG:  Recent Labs Lab 02/19/14 2153 02/20/14 0739  GLUCAP 160* 94    Recent Results (from the past 240 hour(s))  MRSA PCR SCREENING     Status: None   Collection Time    02/19/14  8:11 PM      Result Value Ref Range Status   MRSA by PCR NEGATIVE  NEGATIVE Final   Comment:            The GeneXpert MRSA Assay (FDA     approved for NASAL specimens     only), is one component of a     comprehensive MRSA colonization     surveillance program. It is not     intended to diagnose MRSA     infection nor to guide or     monitor treatment for     MRSA infections.       Studies: Dg Pelvis Portable  02/19/2014   CLINICAL DATA:  Hip, pelvic pain  EXAM: PORTABLE PELVIS 1-2 VIEWS  COMPARISON:  None.  FINDINGS: Evaluation is limited secondary to patient rotation. There is no acute fracture or dislocation. There is no lytic or sclerotic osseous lesion. There are mild degenerative changes of the SI joints.  IMPRESSION: Limited evaluation secondary to patient rotation. No acute osseous injury of the pelvis.   Electronically Signed   By: Elige KoHetal  Patel   On: 02/19/2014 21:29   Dg Chest Portable 1 View  02/19/2014   CLINICAL  DATA:  Chest pain  EXAM: PORTABLE CHEST - 1 VIEW  COMPARISON:  01/22/2014  FINDINGS: The heart size and mediastinal contours are within normal limits. Both lungs are clear. The visualized skeletal structures are unremarkable.  IMPRESSION: No active disease.   Electronically Signed   By: Alcide CleverMark  Lukens M.D.   On: 02/19/2014 14:16   Dg Knee Left Port  02/19/2014   CLINICAL DATA:  Left knee pain  EXAM: PORTABLE LEFT KNEE - 1-2 VIEW  COMPARISON:  DG KNEE 1-2 VIEWS*L* dated 01/21/2014  FINDINGS: There is no acute fracture or dislocation. There is a moderate joint effusion. There are tricompartmental osteoarthritic changes most severe in the lateral femorotibial compartment and patellofemoral compartment.  IMPRESSION: 1. No acute osseous injury of the left knee. 2. Tricompartmental osteoarthritis of the left knee most severe in the lateral femorotibial compartment and patellofemoral  compartment.   Electronically Signed   By: Elige KoHetal  Patel   On: 02/19/2014 21:30        Scheduled Meds: . amLODipine  10 mg Oral Daily  . apixaban  5 mg Oral BID  . atorvastatin  40 mg Oral QHS  . brinzolamide  1 drop Both Eyes BID  . carvedilol  12.5 mg Oral BID WC  . ceFEPime (MAXIPIME) IV  1 g Intravenous Q8H  . collagenase   Topical Daily  . insulin aspart  0-9 Units Subcutaneous TID WC  . latanoprost  1 drop Both Eyes QHS  . lidocaine  1 patch Transdermal Q24H  . polyethylene glycol  17 g Oral Daily  . senna-docusate  2 tablet Oral BID  . traMADol  25 mg Oral 3 times per day  . vancomycin  1,000 mg Intravenous Q12H   Continuous Infusions:   Principal Problem:   Complicated UTI (urinary tract infection) Active Problems:   Atrial fibrillation   HTN (hypertension)   Dyslipidemia   Leukocytosis   Sacral decubitus ulcer, stage III   Left leg pain   Diabetes mellitus without complication    Time spent: 40 minutes.    Elease EtienneAnand D Liannah Yarbough, MD, FACP, [facility]. [facility] Pager [phone]  If 7PM-7AM, please  contact night-coverage [email or web address] Password [email or web address] 02/20/2014, 10:42 AM    LOS: 1 day

## 2014-02-20 NOTE — Progress Notes (Signed)
Chaplain was requested by patient for prayer. The patient was alert and happy to have a visit from chaplain. The patient had his two daughters with him at time of visit. Chaplain provided prayer, listening ear for the family and encouragement to both patient and family.  Cindie CrumblyBeverley Cassiopeia Florentino, Chaplain  02/20/14 1200  Clinical Encounter Type  Visited With Patient;Patient and family together  Visit Type Spiritual support  Referral From Patient  Consult/Referral To None  Spiritual Encounters  Spiritual Needs Prayer  Stress Factors  Patient Stress Factors None identified  Family Stress Factors Health changes  Advance Directives (For Healthcare)  Advance Directive Patient does not have advance directive  Pre-existing out of facility DNR order (yellow form or pink MOST form) No

## 2014-02-20 NOTE — Evaluation (Addendum)
Physical Therapy Evaluation Patient Details Name: Joel [name] MRN: [id number] DOB: 12/18/1927 Today's Date: 02/20/2014   History of Present Illness  80 year old male with past medical history of recent CVA (right MCA stroke), [facility], was found to be in atrial fibrillation and is now on Eliquis for anticoagulation who [facility] with complaints of increased lethargy and confusion as noted by his family for past 24 hours.  Possible UTI.   Clinical Impression  Pt adm due to the above. Presents with limitations in functional mobility secondary to deficits indicated below. Pt to benefit from skilled acute PT to address deficits and maximize functional mobility and address deconditioning. Pt is a somewhat poor historian, no family present, PLOF obtained from chart and RN. Pt stated multiple times he wants to stand again. Will cont to [facility].    Follow Up Recommendations SNF;Supervision/Assistance - 24 hour    Equipment Recommendations  None recommended by PT    Recommendations for Other Services       Precautions / Restrictions Precautions Precautions: Fall Precaution Comments: Lt hemiplegia  Restrictions Weight Bearing Restrictions: No Other Position/Activity Restrictions: WBAT per RN; no orders at this time       Mobility  Bed Mobility Overal bed mobility: +2 for physical assistance;Needs Assistance Bed Mobility: Supine to Sit;Sit to Supine;Rolling Rolling: Mod assist   Supine to sit: +2 for physical assistance;Max assist Sit to supine: +2 for physical assistance;Max assist   General bed mobility comments: pt attempting to (A) with bed mobility with use of Rt hand; has increased difficulty with LE advancement due to Lt hemiplegia and increased pain in bil LEs; use of draw pad and helicopter technique to pivot hips around to sitting position and back into supine position; pt thrusting posteriorly throughout  transfer to EOB; requires max cues for sequencing and safety and total (A) to maintain sitting EOB due to weak abdominals and Rt sided lean preference   Transfers                 General transfer comment: unsafe to transfer due to posterior thrusting at this time   Ambulation/Gait                Stairs            Wheelchair Mobility    Modified Rankin (Stroke Patients Only)       Balance Overall balance assessment: Needs assistance;History of Falls Sitting-balance support: Feet supported;Single extremity supported Sitting balance-Leahy Scale: Zero Sitting balance - Comments: leaning posteriorly and to the Rt at all times; requires total (A) to sit EOB; is a fall risk and requires LEs to be blocked to prevent scooting into floor due to thrusting; pt was able to relax and flex knees for feet to reach floor; tolerated sitting EOB ~5 min Postural control: Posterior lean;Right lateral lean                                   Pertinent Vitals/Pain Yells out in obvious pain with activity in Lt LE; Lt LE elevated at end of session    Home Living Family/patient expects to be discharged to:: Skilled nursing facility                 Additional Comments: [facility]; pt is a poor historain; history mainly obtained from chart and  RN     Prior Function Level of Independence: Needs assistance   Gait / Transfers Assistance Needed: began working on ambulation at rehab otherwise used w/c     Comments: again pt is poor historian; stated he was mainl in his w/c but working on standing with therapy     Hand Dominance   Dominant Hand: Right    Extremity/Trunk Assessment   Upper Extremity Assessment: LUE deficits/detail;Defer to OT evaluation       LUE Deficits / Details: Lt hemiplegia    Lower Extremity Assessment: LLE deficits/detail   LLE Deficits / Details: Lt hemiplegia; C/ o increased pain in Lt knee; pt with 20 degree flexion  contracture   Cervical / Trunk Assessment: Kyphotic  Communication   Communication: Expressive difficulties;Other (comment) (little verbalizations; mainly yes/no to questions )  Cognition Arousal/Alertness: Lethargic Behavior During Therapy: Flat affect Overall Cognitive Status: No family/caregiver present to determine baseline cognitive functioning Area of Impairment: Orientation;Memory;Following commands;Safety/judgement;Problem solving Orientation Level: Disoriented to;Time   Memory: Decreased short-term memory Following Commands: Follows one step commands with increased time     Problem Solving: Decreased initiation;Difficulty sequencing;Requires verbal cues;Requires tactile cues General Comments: pt with few verbalizations except yes/no during session; with eyes closed but was aroused easily      General Comments      Exercises Low Level/ICU Exercises Ankle Circles/Pumps: PROM;AAROM;Both;10 reps;Supine Heel Slides: AROM;Right;5 reps (limited by pain)      Assessment/Plan    PT Assessment Patient needs continued PT services  PT Diagnosis Difficulty walking;Generalized weakness;Acute pain   PT Problem List Decreased strength;Decreased range of motion;Decreased activity tolerance;Decreased balance;Decreased mobility;Decreased cognition;Decreased knowledge of use of DME;Decreased safety awareness;Pain  PT Treatment Interventions DME instruction;Functional mobility training;Therapeutic activities;Therapeutic exercise;Balance training;Neuromuscular re-education;Patient/family education   PT Goals (Current goals can be found in the Care Plan section) Acute Rehab PT Goals Patient Stated Goal: pt asked multiple times to stand  PT Goal Formulation: Patient unable to participate in goal setting Time For Goal Achievement: 02/27/14 Potential to Achieve Goals: Fair    Frequency Min 2X/week   Barriers to discharge        Co-evaluation               End of Session    Activity Tolerance: Patient limited by pain Patient left: in bed;with call bell/phone within reach;with bed alarm set Nurse Communication: Mobility status;Precautions;Weight bearing status         Time: 1354-1410 PT Time Calculation (min): 16 min   Charges:   PT Evaluation $Initial PT Evaluation Tier I: 1 Procedure PT Treatments $Therapeutic Activity: 8-22 mins   PT G CodesNadara Mustard:          Norell Brisbin N [place], [place]  [phone] 02/20/2014, 5:07 PM

## 2014-02-20 NOTE — Consult Note (Signed)
WOC wound consult note Reason for Consult: evaluate sacral ulcer Pt from SNF, limited mobility.  Pain when turned.  Poor historian, but he does acknowledge pressure ulcer on his sacrum  Wound type: 1. Unstageable pressure ulcer; sacrum: 3cm x 2cm x 0.5cm  2. Stage III pressure ulcer; left trochanter: 1.0cm x 1.5cm x 0.2cm  3. Unknown lesion left thigh: 0.3cm x 0.3cm x 0 4. Unstageable pressure ulcer; right heel: 6cm x 2.5cm x 0 Pressure Ulcer POA: Yes x 3 Measurement:see above Wound bed: Sacrum: 100% base yellow slough; full thickness Left trochanter: 90% yellow thin fibrin with some pink centrally; full thickness  Left thigh: circular;black lesion Right heel: stable, 100% black eschar, no fluctuance  Drainage (amount, consistency, odor) minimal at the sacrum and left trochanter Periwound:intact, dry skin of the heel and feet Dressing procedure/placement/frequency: Add enzymatic debridement ointment for the left trochanter and the sacrum to clear of necrotic tissue. Turn and reposition.  Betadine to the right heel to keep stable, air dry and offload with Prevalon boot.   Discussed POC with patient and bedside nurse. Also discussed with Dr. Waymon AmatoHongalgi. Re consult if needed, will not follow at this time. Thanks  Jamario Colina [employer] RN, CWOCN [phone])

## 2014-02-20 NOTE — Progress Notes (Signed)
Addendum  Discussed with Dr. Penni BombardKendall, orthopedics who advised that his left knee pain may be arthritic in nature and to try weightbearing as tolerated and can follow up with him as outpatient. If patient has uncontrolled pain or unable to weight bear due to pain, he may need CT (as per previous OP plan) to R/O occult fracture & call Dr. Penni BombardKendall who will have his partner see the patient in the hospital.  Updated patient's daughter Ms. Leandra KernPamela  Amala Petion D Quantarius Genrich, MD, FACP, [facility]. [facility] Pager [phone]  If 7PM-7AM, please contact night-coverage [email or web address] Password [email or web address] 02/20/2014, 5:13 PM

## 2014-02-20 NOTE — Progress Notes (Signed)
OT Cancellation Note  Patient Details Name: Angie Favaeter Shell MRN: [id number] DOB: 06/06/1928   Cancelled Treatment:    Reason Eval/Treat Not Completed: OT screened, no needs identified, will sign off (Pt is from SNF and returning to SNF.  ) Will defer OT evel to SNF's discretion.  Dayton BailiffJulie Lynn Riniyah Speich 02/20/2014, 1:52 PM [phone]

## 2014-02-20 NOTE — Evaluation (Signed)
Clinical/Bedside Swallow Evaluation Patient Details  Name: Joel [name] MRN: [id number] Date of Birth: 07/09/1928  Today's Date: 02/20/2014 Time: [id number] SLP Time Calculation (min): 24 min  Past Medical History:  Past Medical History  Diagnosis Date  . A-fib   . Hypertension   . Rib fracture 12/29/2013    "fell in his home; due to the stroke"  . CVA (cerebral infarction)   . Encephalopathy   . Hypercholesterolemia   . Pneumonia 12/2013  . Stroke 12/29/2013    "weaker on left side since; vision, bladder, vision problems since" (02/19/2014)  . Arthritis     "knees; fingers" (02/19/2014)  . Foley catheter in place     "came into hospital w/it" (02/19/2014)   Past Surgical History:  Past Surgical History  Procedure Laterality Date  . No past surgeries     HPI:  80 y.o. male with history of CVA (diagnosed mid March 2015 and [facility] residual left hemiplegia & dysphagia, nonambulatory for the last 2-3 weeks, A. fib on Apixaban, type II DM, HTN, indwelling Foley catheter since March 2015, rib fracture, hypercholesterolemia, presented to the ED from SNF with complaints of abdominal pain, LLE vs generalized pain and bedsore. UA suspicious for UTI. There are vague reports of dysphagia. Orders from SNF state pt was on dys 3 diet with no mention of thickened liquids.    Assessment / Plan / Recommendation Clinical Impression  Pt demonstrates subtle signs of dysphagia including delayed swallow initiation, decreased hyolaryngeal elevation and multiple swallows. Otherwise there is no indication of aspiration or penetration. Pt masticated graham cracker within a normal time frame.  Daughter was able to report that pt has been on a mechanical soft diet with pureed meats due to prolonged chewing but has never taken thickened liquids. Currently, recommend pt continue dys 2 (finely chopped) diet and thin liquids with f/u to check for tolerance and ability to upgrade with a meal.     Aspiration  Risk  Mild    Diet Recommendation Dysphagia 2 (Fine chop);Thin liquid   Liquid Administration via: Cup;Straw Medication Administration: Whole meds with liquid Supervision: Staff to assist with self feeding Compensations: Slow rate;Small sips/bites Postural Changes and/or Swallow Maneuvers: Seated upright 90 degrees    Other  Recommendations Oral Care Recommendations: Oral care BID   Follow Up Recommendations  Skilled Nursing facility    Frequency and Duration min 2x/week  1 week   Pertinent Vitals/Pain NA    SLP Swallow Goals     Swallow Study Prior Functional Status       General HPI: 80 y.o. male with history of CVA (diagnosed mid March 2015 and [facility]), residual left hemiplegia & dysphagia, nonambulatory for the last 2-3 weeks, A. fib on Apixaban, type II DM, HTN, indwelling Foley catheter since March 2015, rib fracture, hypercholesterolemia, presented to the ED from SNF with complaints of abdominal pain, LLE vs generalized pain and bedsore. UA suspicious for UTI. There are vague reports of dysphagia. Orders from SNF state pt was on dys 3 diet with no mention of thickened liquids.  Type of Study: Bedside swallow evaluation Diet Prior to this Study: Thin liquids;Dysphagia 2 (chopped) Temperature Spikes Noted: Yes Respiratory Status: Room air History of Recent Intubation: No Behavior/Cognition: Alert;Cooperative Oral Cavity - Dentition: Adequate natural dentition Self-Feeding Abilities: Needs assist Patient Positioning: Upright in bed Baseline Vocal Quality: Clear Volitional Cough: Strong Volitional Swallow: Able to elicit    Oral/Motor/Sensory Function Overall Oral Motor/Sensory Function: Appears within functional limits  for tasks assessed   Ice Chips Ice chips: Not tested   Thin Liquid Thin Liquid: Impaired Presentation: Cup;Straw Pharyngeal  Phase Impairments: Suspected delayed Swallow;Decreased hyoid-laryngeal movement;Multiple swallows    Nectar Thick  Nectar Thick Liquid: Not tested   Honey Thick Honey Thick Liquid: Not tested   Puree Puree: Impaired Pharyngeal Phase Impairments: Suspected delayed Swallow;Decreased hyoid-laryngeal movement;Multiple swallows   Solid   GO    Solid: Impaired Pharyngeal Phase Impairments: Suspected delayed Swallow;Throat Clearing - Immediate      Harlon DittyBonnie Taj Arteaga, MA CCC-SLP [phone]  Riley NearingBonnie Caroline Abdullah Rizzi 02/20/2014,3:21 PM

## 2014-02-21 DIAGNOSIS — I10 Essential (primary) hypertension: Secondary | ICD-10-CM

## 2014-02-21 DIAGNOSIS — L089 Local infection of the skin and subcutaneous tissue, unspecified: Secondary | ICD-10-CM

## 2014-02-21 DIAGNOSIS — G9340 Encephalopathy, unspecified: Secondary | ICD-10-CM

## 2014-02-21 DIAGNOSIS — T148XXA Other injury of unspecified body region, initial encounter: Secondary | ICD-10-CM

## 2014-02-21 DIAGNOSIS — E119 Type 2 diabetes mellitus without complications: Secondary | ICD-10-CM

## 2014-02-21 LAB — URINE CULTURE: Special Requests: NORMAL

## 2014-02-21 LAB — CBC
HCT: 30.5 % — ABNORMAL LOW (ref 39.0–52.0)
HEMOGLOBIN: 10 g/dL — AB (ref 13.0–17.0)
MCH: 30.3 pg (ref 26.0–34.0)
MCHC: 32.8 g/dL (ref 30.0–36.0)
MCV: 92.4 fL (ref 78.0–100.0)
PLATELETS: 443 10*3/uL — AB (ref 150–400)
RBC: 3.3 MIL/uL — ABNORMAL LOW (ref 4.22–5.81)
RDW: 13.7 % (ref 11.5–15.5)
WBC: 11.3 10*3/uL — ABNORMAL HIGH (ref 4.0–10.5)

## 2014-02-21 LAB — GLUCOSE, CAPILLARY
GLUCOSE-CAPILLARY: 131 mg/dL — AB (ref 70–99)
Glucose-Capillary: 142 mg/dL — ABNORMAL HIGH (ref 70–99)
Glucose-Capillary: 129 mg/dL — ABNORMAL HIGH (ref 70–99)
Glucose-Capillary: 128 mg/dL — ABNORMAL HIGH (ref 70–99)

## 2014-02-21 MED ORDER — SULFAMETHOXAZOLE-TMP DS 800-160 MG PO TABS
1.0000 | ORAL_TABLET | Freq: Two times a day (BID) | ORAL | Status: DC
  Administered 2014-02-21 – 2014-02-23 (×5): 1 via ORAL
  Filled 2014-02-21 (×6): qty 1

## 2014-02-21 MED ORDER — CYCLOBENZAPRINE HCL 5 MG PO TABS
5.0000 mg | ORAL_TABLET | Freq: Three times a day (TID) | ORAL | Status: DC | PRN
  Administered 2014-02-21 – 2014-02-22 (×2): 5 mg via ORAL
  Filled 2014-02-21 (×2): qty 1

## 2014-02-21 NOTE — ED Provider Notes (Signed)
Medical screening examination/treatment/procedure(s) were performed by non-physician practitioner and as supervising physician I was immediately available for consultation/collaboration.   EKG Interpretation   Date/Time:  Wednesday Feb 19 2014 16:06:14 EDT Ventricular Rate:  84 PR Interval:    QRS Duration: 102 QT Interval:  576 QTC Calculation: 681 R Axis:   13 Text Interpretation:  Atrial fibrillation Ventricular premature complex  Borderline repolarization abnormality Prolonged QT interval Artifact in  lead(s) I III aVR aVL aVF V1 V2 V4 V5 V6 ED PHYSICIAN INTERPRETATION  AVAILABLE IN [facility] Confirmed by TEST, Record ([id number]) on 02/21/2014  9:14:34 AM        Laray AngerKathleen M Nadea Kirkland, DO 02/21/14 2101

## 2014-02-21 NOTE — Progress Notes (Signed)
PROGRESS NOTE    Joel [name] UEA:[id number] DOB: 09/13/1928 DOA: 02/19/2014 PCP: Georgann HousekeeperHUSAIN,KARRAR, MD  HPI/Brief narrative 80 y.o. male with history of CVA (diagnosed mid March 2015 and [facility] residual left hemiplegia & dysphagia, nonambulatory for the last 2-3 weeks, A. fib on Apixaban, type II DM, HTN, indwelling Foley catheter since March 2015, rib fracture, hypercholesterolemia, presented to the ED from SNF with complaints of abdominal pain, LLE vs generalized pain and bedsore.  UA suspicious for UTI.  Assessment/Plan: Complicated UTI/indwelling Foley catheter/abdominal pain: - IV cefepime per pharmacy which should cover the UTI and infected sacral decubitus ulcer. - If no fever,  urine culture E. Coli sensitive to bactrim. - Needs to follow up with urology as outpatient regarding management of Foley catheter. - if Remains afebrile can be d/c with in 48Hrs.  Unstageable sacral decubitus ulcer:  - Wound care consultation appreciated. As discussed with WOC nurse- wounds don't look overly infected and can be managed with topical Rx or oral Abx.  Mild dehydration:  - Brief IV fluids>resolved.  CVA with residual left hemiplegia and dysphagia:  - Continue anticoagulation.  Left lower extremity/? Knee pain/generalized pain: - ? Neuropathic pain from CVA.? Arthritic pain. Pelvic x-rays negative for fractures. X-ray of left knee shows severe arthritic changes. - Orthopedic M.D. Dr. Penni BombardKendall. Will minimize opioids due to prior history of sedation from same.  Atrial fibrillation:  - Controlled ventricular rate. Continue anticoagulation. Continue carvedilol.  Hypertension:  - Controlled. Continue amlodipine and carvedilol.  History of dyslipidemia:  Anemia:  - Slight drop in hemoglobin probably from hemodilution. Follow CBC in a.m.   Type II DM:  - SSI and monitor CBGs. Family denies history of DM. However hemoglobin A1c in March was 6.8.    Code Status: Full Family  Communication: Discussed with patient's 2 daughters at bedside. Disposition Plan: SNF when medically stable.   Consultants:  None  Procedures:  Foley catheter-patient has indwelling Foley catheter which were changed in the ED on 5/6.  Antibiotics:  IV cefepime 5/6 >5.8.2015  IV vancomycin 5/6 > 5.8.2015  Bactrim 5.8.2015  Subjective: Patient states that he feels better.   Objective: Filed Vitals:   02/20/14 1322 02/20/14 2110 02/21/14 0520 02/21/14 0815  BP: 141/74 125/65 142/71 133/58  Pulse: 71 70 85 62  Temp: 98.7 F (37.1 C) 98 F (36.7 C) 98.3 F (36.8 C)   TempSrc: Oral Oral Oral   Resp: 18 16 16    Height:      Weight:   77.9 kg (171 lb 11.8 oz)   SpO2: 99% 100% 100%     Intake/Output Summary (Last 24 hours) at 02/21/14 1352 Last data filed at 02/21/14 0518  Gross per 24 hour  Intake    690 ml  Output   2100 ml  Net  -1410 ml   Filed Weights   02/19/14 1747 02/20/14 0555 02/21/14 0520  Weight: 74.7 kg (164 lb 10.9 oz) 77.7 kg (171 lb 4.8 oz) 77.9 kg (171 lb 11.8 oz)     Exam:  General exam: Moderately built and thinly nourished male lying comfortably in bed. Respiratory system: Clear. No increased work of breathing. Cardiovascular system: S1 & S2 heard, irregularly irregular. No JVD, murmurs, gallops, clicks or pedal edema. Gastrointestinal system: Abdomen is nondistended, soft and nontender. Normal bowel sounds heard. Foley catheter + Central nervous system: Alert and oriented. No focal neurological deficits. Extremities: 5 x 5 power right upper extremity. 2 x 5 power left upper extremity with  possible contractures. Bilateral lower extremity strength evaluation is limited secondary to pain but no acute findings to explain pain.   Data Reviewed: Basic Metabolic Panel:  Recent Labs Lab 02/19/14 1304  NA 137  K 4.3  CL 103  CO2 21  GLUCOSE 122*  BUN 18  CREATININE 0.82  CALCIUM 9.4   Liver Function Tests:  Recent Labs Lab  02/19/14 1439  AST 22  ALT 20  ALKPHOS 152*  BILITOT 0.4  PROT 7.6  ALBUMIN 2.8*   No results found for this basename: LIPASE, AMYLASE,  in the last 168 hours  Recent Labs Lab 02/19/14 1439  AMMONIA 30   CBC:  Recent Labs Lab 02/19/14 1304 02/20/14 0542 02/21/14 0635  WBC 13.2* 10.5 11.3*  NEUTROABS 8.6*  --   --   HGB 10.9* 9.2* 10.0*  HCT 32.8* 27.9* 30.5*  MCV 91.6 92.1 92.4  PLT 540* 443* 443*   Cardiac Enzymes: No results found for this basename: CKTOTAL, CKMB, CKMBINDEX, TROPONINI,  in the last 168 hours BNP (last 3 results) No results found for this basename: PROBNP,  in the last 8760 hours CBG:  Recent Labs Lab 02/20/14 1154 02/20/14 1641 02/20/14 2138 02/21/14 0759 02/21/14 1214  GLUCAP 117* 126* 135* 131* 142*    Recent Results (from the past 240 hour(s))  URINE CULTURE     Status: None   Collection Time    02/19/14  3:40 PM      Result Value Ref Range Status   Specimen Description URINE, CLEAN CATCH   Final   Special Requests Normal   Final   Culture  Setup Time     Final   Value: 02/19/2014 19:09     Performed at [facility]   Colony Count     Final   Value: >=100,000 COLONIES/ML     Performed at [facility]   Culture     Final   Value: ESCHERICHIA COLI     Performed at [facility]   Report Status 02/21/2014 FINAL   Final   Organism ID, Bacteria ESCHERICHIA COLI   Final  MRSA PCR SCREENING     Status: None   Collection Time    02/19/14  8:11 PM      Result Value Ref Range Status   MRSA by PCR NEGATIVE  NEGATIVE Final   Comment:            The GeneXpert MRSA Assay (FDA     approved for NASAL specimens     only), is one component of a     comprehensive MRSA colonization     surveillance program. It is not     intended to diagnose MRSA     infection nor to guide or     monitor treatment for     MRSA infections.       Studies: Dg Pelvis Portable  02/19/2014   CLINICAL DATA:  Hip, pelvic pain  EXAM:  PORTABLE PELVIS 1-2 VIEWS  COMPARISON:  None.  FINDINGS: Evaluation is limited secondary to patient rotation. There is no acute fracture or dislocation. There is no lytic or sclerotic osseous lesion. There are mild degenerative changes of the SI joints.  IMPRESSION: Limited evaluation secondary to patient rotation. No acute osseous injury of the pelvis.   Electronically Signed   By: Elige KoHetal  Patel   On: 02/19/2014 21:29   Dg Chest Portable 1 View  02/19/2014   CLINICAL DATA:  Chest pain  EXAM: PORTABLE  CHEST - 1 VIEW  COMPARISON:  01/22/2014  FINDINGS: The heart size and mediastinal contours are within normal limits. Both lungs are clear. The visualized skeletal structures are unremarkable.  IMPRESSION: No active disease.   Electronically Signed   By: Alcide Clever M.D.   On: 02/19/2014 14:16   Dg Knee Left Port  02/19/2014   CLINICAL DATA:  Left knee pain  EXAM: PORTABLE LEFT KNEE - 1-2 VIEW  COMPARISON:  DG KNEE 1-2 VIEWS*L* dated 01/21/2014  FINDINGS: There is no acute fracture or dislocation. There is a moderate joint effusion. There are tricompartmental osteoarthritic changes most severe in the lateral femorotibial compartment and patellofemoral compartment.  IMPRESSION: 1. No acute osseous injury of the left knee. 2. Tricompartmental osteoarthritis of the left knee most severe in the lateral femorotibial compartment and patellofemoral compartment.   Electronically Signed   By: Elige Ko   On: 02/19/2014 21:30        Scheduled Meds: . amLODipine  10 mg Oral Daily  . antiseptic oral rinse  15 mL Mouth Rinse BID  . apixaban  5 mg Oral BID  . atorvastatin  40 mg Oral QHS  . brinzolamide  1 drop Both Eyes BID  . carvedilol  12.5 mg Oral BID WC  . ceFEPime (MAXIPIME) IV  1 g Intravenous Q8H  . collagenase   Topical Daily  . insulin aspart  0-9 Units Subcutaneous TID WC  . latanoprost  1 drop Both Eyes QHS  . lidocaine  1 patch Transdermal Q24H  . polyethylene glycol  17 g Oral Daily  .  senna-docusate  2 tablet Oral BID  . traMADol  25 mg Oral 3 times per day  . vancomycin  1,000 mg Intravenous Q12H   Continuous Infusions:   Principal Problem:   Complicated UTI (urinary tract infection) Active Problems:   Atrial fibrillation   HTN (hypertension)   Dyslipidemia   Leukocytosis   Sacral decubitus ulcer, stage III   Left leg pain   Diabetes mellitus without complication    Time spent: 40 minutes.    Marinda Elk, MD, FACP, [facility]. [facility] Pager [phone]  If 7PM-7AM, please contact night-coverage [email or web address] Password [email or web address] 02/21/2014, 1:52 PM    LOS: 2 days

## 2014-02-21 NOTE — Progress Notes (Signed)
Speech Language Pathology Treatment: Dysphagia  Patient Details Name: Joel [name] MRN: [id number] DOB: 09-05-1928 Today's Date: 02/21/2014 Time: 2419-9144 SLP Time Calculation (min): 10 min  Assessment / Plan / Recommendation Clinical Impression  Pt observed with self feeding during breakfast. Pt appreciative of ground texture as it facilitates self feeding, though no prolonged mastication or oral residuals noted. No evidence of aspiration though swallow continues to be slightly delayed. Recommend pt continue current diet with basic aspiration precautions. No further SLP treatment needed in acute setting.    HPI HPI: 80 y.o. male with history of CVA (diagnosed mid March 2015 and admitted at [facility]), residual left hemiplegia & dysphagia, nonambulatory for the last 2-3 weeks, A. fib on Apixaban, type II DM, HTN, indwelling Foley catheter since March 2015, rib fracture, hypercholesterolemia, presented to the ED from SNF with complaints of abdominal pain, LLE vs generalized pain and bedsore. UA suspicious for UTI. There are vague reports of dysphagia. Orders from SNF state pt was on dys 3 diet with no mention of thickened liquids.    Pertinent Vitals Pt reports pain, SLP repositioned with relief  SLP Plan  All goals met    Recommendations Diet recommendations: Dysphagia 2 (fine chop);Thin liquid Medication Administration: Whole meds with liquid Supervision: Patient able to self feed Compensations: Slow rate;Small sips/bites Postural Changes and/or Swallow Maneuvers: Seated upright 90 degrees              Oral Care Recommendations: Oral care BID Follow up Recommendations: Skilled Nursing facility Plan: All goals met    GO    Herbie Baltimore, MA CCC-SLP [place] Sarath Privott 02/21/2014, 9:51 AM

## 2014-02-21 NOTE — Clinical Documentation Improvement (Signed)
A cause and effect relationship may not be assumed and must be documented by a provider. Please clarify the relationship, if any, between UTI and Foley catheter.  Are the conditions:   Due to or associated with each other   Other (please specify) ___________________   Unrelated to each other   Unable to determine   Unknown    Risk Factors: Primary problem in H&P: Complicated UTI (urinary tract infection) indwelling Foley catheter since March 2015,   Sign & Symptoms: ED provider note: Patient's foley catheter produces green pus.  Diagnostics: Component     Latest Ref Rng 02/19/2014  Color, Urine     YELLOW YELLOW  APPearance     CLEAR CLOUDY (A)  Specific Gravity, Urine     1.005 - 1.030 1.016  pH     5.0 - 8.0 5.0  Glucose     NEGATIVE mg/dL NEGATIVE  Hgb urine dipstick     NEGATIVE LARGE (A)  Bilirubin Urine     NEGATIVE NEGATIVE  Ketones, ur     NEGATIVE mg/dL NEGATIVE  Protein     NEGATIVE mg/dL 30 (A)  Urobilinogen, UA     0.0 - 1.0 mg/dL 1.0  Nitrite     NEGATIVE NEGATIVE  Leukocytes, UA     NEGATIVE LARGE (A)   Component     Latest Ref Rng 02/19/2014          Specimen Description      URINE, CLEAN CATCH  Special Requests      Normal  Culture  Setup Time      02/19/2014 19:09 . . .  Colony Count      >=100,000 COLONIES/ML . . .  Culture      ESCHERICHIA COLI . Marland Kitchen. Marland Kitchen.  Report Status      02/21/2014 FINAL  Organism ID, Bacteria      ESCHERICHIA COLI   Treatment: IV Maxipime 1g q8h UA & C&S   Thank you, Harless Littenebora T Anquinette Pierro ,RN Clinical Documentation Specialist:  [phone]  [facility]- Health Information Management

## 2014-02-21 NOTE — Clinical Social Work Psychosocial (Signed)
Clinical Social Work Department BRIEF PSYCHOSOCIAL ASSESSMENT 02/21/2014  Patient:  Angie FavaMORGAN,Ariyan     Account Number:  [id number]     Admit date:  02/19/2014  Clinical Social Worker:  Sherre LainSUMMERVILLE,EMILY, LCSWA  Date/Time:  02/21/2014 02:14 PM  Referred by:  Physician  Date Referred:  02/21/2014 Referred for  SNF Placement   Other Referral:   none.   Interview type:  Family Other interview type:   CSW spoke with pt's daughter, Posey ProntoSandra Carrasco.    PSYCHOSOCIAL DATA Living Status:  FACILITY [facility] Level of care:  Skilled Nursing Facility Primary support name:  Dois DavenportSandra and Val Rilesamela Huesca Primary support relationship to patient:  CHILD, ADULT Degree of support available:   Strong support system.    CURRENT CONCERNS Current Concerns  Post-Acute Placement   Other Concerns:   none.    SOCIAL WORK ASSESSMENT / PLAN CSW received consult regarding pt admitted from facility [[facility]]. CSW contacted pt's daughters regarding discharge disposition. Pt's daughter, Dois DavenportSandra, confirmed [facility] and family preference is for pt to return upon medical stability.    Per pt's daughter, Dois DavenportSandra, [facility]]. CSW to continue to follow and assist with discharge planning needs.   Assessment/plan status:  Psychosocial Support/Ongoing Assessment of Needs Other assessment/ plan:   none.   Information/referral to community resources:   Pt returning to [facility] SNF at time of discharge.    PATIENTS/FAMILYS RESPONSE TO PLAN OF CARE: Pt's daughters understanding and agreeable to CSW plan of care.       Darlyn ChamberEmily [place], LCSWA Clinical Social Worker [phone]

## 2014-02-22 DIAGNOSIS — I4891 Unspecified atrial fibrillation: Secondary | ICD-10-CM

## 2014-02-22 LAB — GLUCOSE, CAPILLARY
Glucose-Capillary: 108 mg/dL — ABNORMAL HIGH (ref 70–99)
Glucose-Capillary: 118 mg/dL — ABNORMAL HIGH (ref 70–99)
Glucose-Capillary: 108 mg/dL — ABNORMAL HIGH (ref 70–99)
Glucose-Capillary: 118 mg/dL — ABNORMAL HIGH (ref 70–99)

## 2014-02-22 NOTE — Progress Notes (Signed)
Patient ID: Joel [name], male   DOB: 06/24/1928, 80 y.o.   MRN: [id number]  [facility] PROGRESS NOTE  Joel [name] [id number] DOB: 03/06/1928 DOA: 02/19/2014 PCP: Georgann HousekeeperHUSAIN,KARRAR, MD  HPI/Brief narrative  80 y.o. male with history of CVA (diagnosed mid March 2015 and admitted at [facility]), residual left hemiplegia & dysphagia, nonambulatory for the last 2-3 weeks, A. fib on Apixaban, type II DM, HTN, indwelling Foley catheter since March 2015, rib fracture, hypercholesterolemia, presented to the ED from SNF with complaints of abdominal pain, LLE vs generalized pain and bedsore. UA suspicious for UTI.   Assessment/Plan:  Complicated UTI/indwelling Foley catheter/abdominal pain:  - IV cefepime per pharmacy started for UTI and infected sacral decubitus ulcer.  -  urine culture E. Coli sensitive to bactrim.  - Needs to follow up with urology as outpatient regarding management of Foley catheter.  - if Remains afebrile can be d/c 24-48 hours  Unstageable sacral decubitus ulcer:  - Wound care consultation appreciated. As discussed with WOC nurse- wounds don't look overly infected and can be managed with topical Rx or oral Abx.  Mild dehydration:  - Brief IV fluids provided and now resolved  CVA with residual left hemiplegia and dysphagia:  - Continue anticoagulation.  Left lower extremity/? Knee pain/generalized pain:  - ? Neuropathic pain from CVA.? Arthritic pain. Pelvic x-rays negative for fractures. X-ray of left knee shows severe arthritic changes.  - Orthopedic M.D. Dr. Penni BombardKendall. Will minimize opioids due to prior history of sedation from same.  Atrial fibrillation:  - Controlled ventricular rate. Continue anticoagulation. Continue carvedilol.  Hypertension:  - Controlled. Continue amlodipine and carvedilol.  History of dyslipidemia:  Anemia:  - Slight drop in hemoglobin probably from hemodilution. Follow CBC in a.m.  Type II DM:  - SSI and monitor CBGs. Family denies history of DM.  However hemoglobin A1c in March was 6.8.   Code Status: Full  Family Communication: Discussed with patient alone  Disposition Plan: SNF in 24-48 hours   Consultants:  None Procedures:  Foley catheter-patient has indwelling Foley catheter which were changed in the ED on 5/6. Antibiotics:  IV cefepime 5/6 >5.8.2015  IV vancomycin 5/6 > 5.8.2015  Bactrim 5.8.2015  HPI/Subjective: No events overnight.   Objective: Filed Vitals:   02/22/14 0500 02/22/14 0614 02/22/14 0850 02/22/14 1315  BP:  137/63 119/66 121/77  Pulse:  74 70 74  Temp:  97.5 F (36.4 C)  99.1 F (37.3 C)  TempSrc:  Axillary  Oral  Resp:  18  16  Height:      Weight: 78.2 kg (172 lb 6.4 oz)     SpO2:  98%  97%    Intake/Output Summary (Last 24 hours) at 02/22/14 1518 Last data filed at 02/22/14 1319  Gross per 24 hour  Intake    690 ml  Output   1400 ml  Net   -710 ml    Exam:   General:  Pt is alert, follows commands appropriately, not in acute distress  Cardiovascular: Regular rate and rhythm, S1/S2, no murmurs, no rubs, no gallops  Respiratory: Clear to auscultation bilaterally, no wheezing, no crackles, no rhonchi  Abdomen: Soft, non tender, non distended, bowel sounds present, no guarding  Data Reviewed: Basic Metabolic Panel:  Recent Labs Lab 02/19/14 1304  NA 137  K 4.3  CL 103  CO2 21  GLUCOSE 122*  BUN 18  CREATININE 0.82  CALCIUM 9.4   Liver Function Tests:  Recent Labs Lab 02/19/14 1439  AST  22  ALT 20  ALKPHOS 152*  BILITOT 0.4  PROT 7.6  ALBUMIN 2.8*    Recent Labs Lab 02/19/14 1439  AMMONIA 30   CBC:  Recent Labs Lab 02/19/14 1304 02/20/14 0542 02/21/14 0635  WBC 13.2* 10.5 11.3*  NEUTROABS 8.6*  --   --   HGB 10.9* 9.2* 10.0*  HCT 32.8* 27.9* 30.5*  MCV 91.6 92.1 92.4  PLT 540* 443* 443*   CBG:  Recent Labs Lab 02/21/14 1214 02/21/14 1726 02/21/14 2203 02/22/14 0759 02/22/14 1204  GLUCAP 142* 129* 128* 108* 118*    Recent Results  (from the past 240 hour(s))  URINE CULTURE     Status: None   Collection Time    02/19/14  3:40 PM      Result Value Ref Range Status   Specimen Description URINE, CLEAN CATCH   Final   Special Requests Normal   Final   Culture  Setup Time     Final   Value: 02/19/2014 19:09     Performed at [facility]   Colony Count     Final   Value: >=100,000 COLONIES/ML     Performed at [facility]   Culture     Final   Value: ESCHERICHIA COLI     Performed at [facility]   Report Status 02/21/2014 FINAL   Final   Organism ID, Bacteria ESCHERICHIA COLI   Final  MRSA PCR SCREENING     Status: None   Collection Time    02/19/14  8:11 PM      Result Value Ref Range Status   MRSA by PCR NEGATIVE  NEGATIVE Final   Comment:            The GeneXpert MRSA Assay (FDA     approved for NASAL specimens     only), is one component of a     comprehensive MRSA colonization     surveillance program. It is not     intended to diagnose MRSA     infection nor to guide or     monitor treatment for     MRSA infections.     Scheduled Meds: . amLODipine  10 mg Oral Daily  . antiseptic oral rinse  15 mL Mouth Rinse BID  . apixaban  5 mg Oral BID  . atorvastatin  40 mg Oral QHS  . brinzolamide  1 drop Both Eyes BID  . carvedilol  12.5 mg Oral BID WC  . collagenase   Topical Daily  . insulin aspart  0-9 Units Subcutaneous TID WC  . latanoprost  1 drop Both Eyes QHS  . lidocaine  1 patch Transdermal Q24H  . polyethylene glycol  17 g Oral Daily  . senna-docusate  2 tablet Oral BID  . sulfamethoxazole-trimethoprim  1 tablet Oral Q12H  . traMADol  25 mg Oral 3 times per day   Continuous Infusions:   Dorothea OgleIskra M Fawne Hughley, MD  [facility] Pager [phone]  If 7PM-7AM, please contact night-coverage [email or web address] Password [email or web address] 02/22/2014, 3:18 PM   LOS: 3 days

## 2014-02-22 NOTE — Plan of Care (Signed)
Problem: Phase I Progression Outcomes Goal: Voiding-avoid urinary catheter unless indicated Outcome: Not Applicable Date Met:  09/38/18 Pt has a foley cath, came with on admission.

## 2014-02-23 LAB — BASIC METABOLIC PANEL
BUN: 13 mg/dL (ref 6–23)
CHLORIDE: 101 meq/L (ref 96–112)
CO2: 19 mEq/L (ref 19–32)
Calcium: 8.6 mg/dL (ref 8.4–10.5)
Creatinine, Ser: 0.99 mg/dL (ref 0.50–1.35)
GFR calc Af Amer: 85 mL/min — ABNORMAL LOW (ref >90)
GFR calc non Af Amer: 74 mL/min — ABNORMAL LOW (ref >90)
GLUCOSE: 94 mg/dL (ref 70–99)
POTASSIUM: 3.9 meq/L (ref 3.7–5.3)
SODIUM: 134 meq/L — AB (ref 137–147)

## 2014-02-23 LAB — CBC
HEMATOCRIT: 27.4 % — AB (ref 39.0–52.0)
Hemoglobin: 9 g/dL — ABNORMAL LOW (ref 13.0–17.0)
MCH: 30.5 pg (ref 26.0–34.0)
MCHC: 32.8 g/dL (ref 30.0–36.0)
MCV: 92.9 fL (ref 78.0–100.0)
Platelets: 399 10*3/uL (ref 150–400)
RBC: 2.95 MIL/uL — AB (ref 4.22–5.81)
RDW: 14 % (ref 11.5–15.5)
WBC: 8.8 10*3/uL (ref 4.0–10.5)

## 2014-02-23 LAB — GLUCOSE, CAPILLARY
GLUCOSE-CAPILLARY: 127 mg/dL — AB (ref 70–99)
GLUCOSE-CAPILLARY: 108 mg/dL — AB (ref 70–99)
GLUCOSE-CAPILLARY: 122 mg/dL — AB (ref 70–99)

## 2014-02-23 MED ORDER — HYDROCODONE-ACETAMINOPHEN 5-325 MG PO TABS: 0.5000 | ORAL_TABLET | Freq: Three times a day (TID) | ORAL | Status: DC | PRN

## 2014-02-23 MED ORDER — SULFAMETHOXAZOLE-TMP DS 800-160 MG PO TABS: 1.0000 | ORAL_TABLET | Freq: Two times a day (BID) | ORAL | Status: DC

## 2014-02-23 MED ORDER — TRAMADOL HCL 50 MG PO TABS: 25.0000 mg | ORAL_TABLET | Freq: Three times a day (TID) | ORAL | Status: DC

## 2014-02-23 NOTE — Progress Notes (Signed)
Report was called and given about patient  to nurse Shree at [facility].

## 2014-02-23 NOTE — Discharge Summary (Addendum)
Physician Discharge Summary  Angie Favaeter Settles ZOX:[id number] DOB: 09/11/1928 DOA: 02/19/2014  PCP: Georgann HousekeeperHUSAIN,KARRAR, MD  Admit date: 02/19/2014 Discharge date: 02/23/2014  Recommendations for Outpatient Follow-up:  1. Pt will need to follow up with PCP in 2-3 weeks post discharge 2. Please obtain BMP to evaluate electrolytes and kidney function 3. Please also check CBC to evaluate Hg and Hct levels 4. Pt discharged on Bactrim to take for 5 more days post discharge   Discharge Diagnoses: UTI  Principal Problem:   Complicated UTI (urinary tract infection) Active Problems:   Atrial fibrillation   HTN (hypertension)   Dyslipidemia   Leukocytosis   Sacral decubitus ulcer, stage III   Left leg pain   Diabetes mellitus without complication  Discharge Condition: Stable  Diet recommendation: Heart healthy diet discussed in details   HPI/Brief narrative  80 y.o. male with history of CVA (diagnosed mid March 2015 and [facility]), residual left hemiplegia & dysphagia, nonambulatory for the last 2-3 weeks, A. fib on Apixaban, type II DM, HTN, indwelling Foley catheter since March 2015, rib fracture, hypercholesterolemia, presented to the ED from SNF with complaints of abdominal pain, LLE vs generalized pain and bedsore. UA suspicious for UTI.   Assessment/Plan:  Complicated UTI/indwelling Foley catheter/abdominal pain:  - IV cefepime per pharmacy started for UTI and infected sacral decubitus ulcer.  - urine culture E. Coli sensitive to bactrim, continue taking for 5 more days post discharge  - Needs to follow up with urology as outpatient regarding management of Foley catheter.  - UTI possibly related to chronic Foley catheter, presence of sacral decubitus ulcer also possibly contributing to chronic and recurrent UTI  Unstageable sacral decubitus ulcer:  - Wound care consultation appreciated. As discussed with WOC nurse- wounds don't look overly infected and can be managed with topical Rx or oral  Abx.  Mild dehydration:  - Brief IV fluids provided and now resolved  CVA with residual left hemiplegia and dysphagia:  - Continue anticoagulation.  Left lower extremity/? Knee pain/generalized pain:  - ? Neuropathic pain from CVA.? Arthritic pain. Pelvic x-rays negative for fractures. X-ray of left knee shows severe arthritic changes.  - Orthopedic M.D. Dr. Penni BombardKendall. Will minimize opioids due to prior history of sedation from same.  Atrial fibrillation:  - Controlled ventricular rate. Continue anticoagulation. Continue carvedilol.  Hypertension:  - Controlled. Continue amlodipine and carvedilol.  History of dyslipidemia:  Anemia:  - Slight drop in hemoglobin probably from hemodilution. No signs of active bleeding Type II DM:  - hemoglobin A1c in March was 6.8.   Code Status: Full  Family Communication: Discussed with patient alone  Disposition Plan: SNF   Consultants:  None Procedures:  Foley catheter-patient has indwelling Foley catheter which were changed in the ED on 5/6. Antibiotics:  IV cefepime 5/6 >5.8.2015  IV vancomycin 5/6 > 5.8.2015  Bactrim 5.8.2015 > 5 more days post discharge    Discharge Exam: Filed Vitals:   02/23/14 0628  BP: 151/64  Pulse: 91  Temp: 98 F (36.7 C)  Resp: 17   Filed Vitals:   02/22/14 1315 02/22/14 1835 02/22/14 2230 02/23/14 0628  BP: 121/77 121/56 121/52 151/64  Pulse: 74 75 74 91  Temp: 99.1 F (37.3 C)  100 F (37.8 C) 98 F (36.7 C)  TempSrc: Oral  Oral Oral  Resp: 16  18 17   Height:      Weight:    77.6 kg (171 lb 1.2 oz)  SpO2: 97%  94% 91%  General: Pt is alert, follows commands appropriately, not in acute distress Cardiovascular: Regular rate and rhythm, S1/S2 +, no murmurs, no rubs, no gallops Respiratory: Clear to auscultation bilaterally, no wheezing, no crackles, no rhonchi Abdominal: Soft, non tender, non distended, bowel sounds +, no guarding  Discharge Instructions  Discharge Orders   Future Orders  Complete By Expires   Diet - low sodium heart healthy  As directed    Increase activity slowly  As directed        Medication List         acetaminophen 325 MG tablet  Commonly known as:  TYLENOL  Take 975 mg by mouth 3 (three) times daily.     amLODipine 10 MG tablet  Commonly known as:  NORVASC  Take 10 mg by mouth daily.     apixaban 5 MG Tabs tablet  Commonly known as:  ELIQUIS  Take 5 mg by mouth 2 (two) times daily.     atorvastatin 40 MG tablet  Commonly known as:  LIPITOR  Take 40 mg by mouth at bedtime.     brinzolamide 1 % ophthalmic suspension  Commonly known as:  AZOPT  Place 1 drop into both eyes 2 (two) times daily.     Brinzolamide-Brimonidine 1-0.2 % Susp  Place 1 drop into both eyes 3 (three) times daily.     carvedilol 12.5 MG tablet  Commonly known as:  COREG  Take 12.5 mg by mouth 2 (two) times daily with a meal.     HYDROcodone-acetaminophen 5-325 MG per tablet  Commonly known as:  NORCO/VICODIN  Take 0.5 tablets by mouth every 8 (eight) hours as needed for moderate pain.     indomethacin 50 MG capsule  Commonly known as:  INDOCIN  Take 1 capsule (50 mg total) by mouth 3 (three) times daily as needed for mild pain or moderate pain.     ipratropium-albuterol 0.5-2.5 (3) MG/3ML Soln  Commonly known as:  DUONEB  Take 3 mLs by nebulization every 4 (four) hours as needed (shortness of breath).     lidocaine 5 %  Commonly known as:  LIDODERM  Place 1 patch onto the skin daily. Remove & Discard patch within 12 hours or as directed by MD     polyethylene glycol packet  Commonly known as:  MIRALAX / GLYCOLAX  Take 17 g by mouth daily.     sennosides-docusate sodium 8.6-50 MG tablet  Commonly known as:  SENOKOT-S  Take 2 tablets by mouth 2 (two) times daily.     sulfamethoxazole-trimethoprim 800-160 MG per tablet  Commonly known as:  BACTRIM DS  Take 1 tablet by mouth every 12 (twelve) hours.     traMADol 50 MG tablet  Commonly known as:   ULTRAM  Take 0.5 tablets (25 mg total) by mouth every 8 (eight) hours.     travoprost (benzalkonium) 0.004 % ophthalmic solution  Commonly known as:  TRAVATAN  Place 1 drop into both eyes at bedtime.           Follow-up Information   Schedule an appointment as soon as possible for a visit with Georgann Housekeeper, MD.   Specialty:  Internal Medicine   Contact information:   [address] [place] [address] [phone]        The results of significant diagnostics from this hospitalization (including imaging, microbiology, ancillary and laboratory) are listed below for reference.     Microbiology: Recent Results (from the past 240 hour(s))  URINE  CULTURE     Status: None   Collection Time    02/19/14  3:40 PM      Result Value Ref Range Status   Specimen Description URINE, CLEAN CATCH   Final   Special Requests Normal   Final   Culture  Setup Time     Final   Value: 02/19/2014 19:09     Performed at [facility]   Colony Count     Final   Value: >=100,000 COLONIES/ML     Performed at [facility]   Culture     Final   Value: ESCHERICHIA COLI     Performed at [facility]   Report Status 02/21/2014 FINAL   Final   Organism ID, Bacteria ESCHERICHIA COLI   Final  MRSA PCR SCREENING     Status: None   Collection Time    02/19/14  8:11 PM      Result Value Ref Range Status   MRSA by PCR NEGATIVE  NEGATIVE Final   Comment:            The GeneXpert MRSA Assay (FDA     approved for NASAL specimens     only), is one component of a     comprehensive MRSA colonization     surveillance program. It is not     intended to diagnose MRSA     infection nor to guide or     monitor treatment for     MRSA infections.     Labs: Basic Metabolic Panel:  Recent Labs Lab 02/19/14 1304 02/23/14 0305  NA 137 134*  K 4.3 3.9  CL 103 101  CO2 21 19  GLUCOSE 122* 94  BUN 18 13  CREATININE 0.82 0.99  CALCIUM 9.4 8.6   Liver  Function Tests:  Recent Labs Lab 02/19/14 1439  AST 22  ALT 20  ALKPHOS 152*  BILITOT 0.4  PROT 7.6  ALBUMIN 2.8*    Recent Labs Lab 02/19/14 1439  AMMONIA 30   CBC:  Recent Labs Lab 02/19/14 1304 02/20/14 0542 02/21/14 0635 02/23/14 0305  WBC 13.2* 10.5 11.3* 8.8  NEUTROABS 8.6*  --   --   --   HGB 10.9* 9.2* 10.0* 9.0*  HCT 32.8* 27.9* 30.5* 27.4*  MCV 91.6 92.1 92.4 92.9  PLT 540* 443* 443* 399   CBG:  Recent Labs Lab 02/22/14 0759 02/22/14 1204 02/22/14 1706 02/22/14 2216 02/23/14 0804  GLUCAP 108* 118* 108* 118* 127*     SIGNED: Time coordinating discharge: Over 30 minutes  Dorothea OgleIskra M Meryem Haertel, MD  [facility] 02/23/2014, 9:40 AM Pager [phone]  If 7PM-7AM, please contact night-coverage [email or web address] Password [email or web address]

## 2014-02-23 NOTE — Progress Notes (Signed)
Ok per MD for d/c today back to [facility] via EMS. Notified patient and his daughter Dois DavenportSandra who was agreeable to return to facility.  She was not able to get to [facility] until late this afternoon to sign admission paperwork. CSW just received call from [place] [facility] that patient's daughter has just shown up to complete the paperwork. Ok now to call EMS for transport. Notified patient's nurse of above. Patient is aware and agreeable to return to facility.  No further CSW needs identified. CSW signing off.  Lorri Frederickonna T. West PughCrowder, LCSWA  [phone]

## 2014-02-23 NOTE — Discharge Instructions (Signed)
Urinary Tract Infection  Urinary tract infections (UTIs) can develop anywhere along your urinary tract. Your urinary tract is your body's drainage system for removing wastes and extra water. Your urinary tract includes two kidneys, two ureters, a bladder, and a urethra. Your kidneys are a pair of bean-shaped organs. Each kidney is about the size of your fist. They are located below your ribs, one on each side of your spine.  CAUSES  Infections are caused by microbes, which are microscopic organisms, including fungi, viruses, and bacteria. These organisms are so small that they can only be seen through a microscope. Bacteria are the microbes that most commonly cause UTIs.  SYMPTOMS   Symptoms of UTIs may vary by age and gender of the patient and by the location of the infection. Symptoms in young women typically include a frequent and intense urge to urinate and a painful, burning feeling in the bladder or urethra during urination. Older women and men are more likely to be tired, shaky, and weak and have muscle aches and abdominal pain. A fever may mean the infection is in your kidneys. Other symptoms of a kidney infection include pain in your back or sides below the ribs, nausea, and vomiting.  DIAGNOSIS  To diagnose a UTI, your caregiver will ask you about your symptoms. Your caregiver also will ask to provide a urine sample. The urine sample will be tested for bacteria and white blood cells. White blood cells are made by your body to help fight infection.  TREATMENT   Typically, UTIs can be treated with medication. Because most UTIs are caused by a bacterial infection, they usually can be treated with the use of antibiotics. The choice of antibiotic and length of treatment depend on your symptoms and the type of bacteria causing your infection.  HOME CARE INSTRUCTIONS   If you were prescribed antibiotics, take them exactly as your caregiver instructs you. Finish the medication even if you feel better after you  have only taken some of the medication.   Drink enough water and fluids to keep your urine clear or pale yellow.   Avoid caffeine, tea, and carbonated beverages. They tend to irritate your bladder.   Empty your bladder often. Avoid holding urine for long periods of time.   Empty your bladder before and after sexual intercourse.   After a bowel movement, women should cleanse from front to back. Use each tissue only once.  SEEK MEDICAL CARE IF:    You have back pain.   You develop a fever.   Your symptoms do not begin to resolve within 3 days.  SEEK IMMEDIATE MEDICAL CARE IF:    You have severe back pain or lower abdominal pain.   You develop chills.   You have nausea or vomiting.   You have continued burning or discomfort with urination.  MAKE SURE YOU:    Understand these instructions.   Will watch your condition.   Will get help right away if you are not doing well or get worse.  Document Released: 07/13/2005 Document Revised: 04/03/2012 Document Reviewed: 11/11/2011  Exit[employer] Patient Information 2014 [employer]

## 2014-02-24 ENCOUNTER — Other Ambulatory Visit: Payer: Self-pay | Admitting: Sports Medicine

## 2014-02-24 DIAGNOSIS — M25569 Pain in unspecified knee: Secondary | ICD-10-CM

## 2014-03-06 ENCOUNTER — Ambulatory Visit: Payer: PRIVATE HEALTH INSURANCE | Admitting: Family Medicine

## 2014-03-07 ENCOUNTER — Ambulatory Visit
Admission: RE | Admit: 2014-03-07 | Discharge: 2014-03-07 | Disposition: A | Discharge status: Home / Self Care | Payer: PRIVATE HEALTH INSURANCE | Source: Ambulatory Visit | Attending: Sports Medicine | Admitting: Sports Medicine

## 2014-03-07 ENCOUNTER — Ambulatory Visit
Admission: RE | Admit: 2014-03-07 | Discharge: 2014-03-07 | Disposition: A | Discharge status: Home / Self Care | Source: Ambulatory Visit | Attending: Sports Medicine | Admitting: Sports Medicine

## 2014-03-07 ENCOUNTER — Encounter (INDEPENDENT_AMBULATORY_CARE_PROVIDER_SITE_OTHER): Payer: Self-pay

## 2014-03-07 DIAGNOSIS — M25569 Pain in unspecified knee: Secondary | ICD-10-CM

## 2014-03-25 ENCOUNTER — Encounter (HOSPITAL_COMMUNITY): Payer: Self-pay | Admitting: Emergency Medicine

## 2014-03-25 ENCOUNTER — Emergency Department (HOSPITAL_COMMUNITY)
Admission: EM | Admit: 2014-03-25 | Discharge: 2014-03-25 | Disposition: A | Discharge status: Home / Self Care | Payer: PRIVATE HEALTH INSURANCE | Attending: Emergency Medicine | Admitting: Emergency Medicine

## 2014-03-25 ENCOUNTER — Emergency Department (HOSPITAL_COMMUNITY): Payer: PRIVATE HEALTH INSURANCE

## 2014-03-25 DIAGNOSIS — I252 Old myocardial infarction: Secondary | ICD-10-CM | POA: Insufficient documentation

## 2014-03-25 DIAGNOSIS — L8993 Pressure ulcer of unspecified site, stage 3: Secondary | ICD-10-CM | POA: Insufficient documentation

## 2014-03-25 DIAGNOSIS — G9340 Encephalopathy, unspecified: Secondary | ICD-10-CM | POA: Insufficient documentation

## 2014-03-25 DIAGNOSIS — L89309 Pressure ulcer of unspecified buttock, unspecified stage: Secondary | ICD-10-CM | POA: Insufficient documentation

## 2014-03-25 DIAGNOSIS — Z87891 Personal history of nicotine dependence: Secondary | ICD-10-CM | POA: Insufficient documentation

## 2014-03-25 DIAGNOSIS — E7800 Pure hypercholesterolemia, unspecified: Secondary | ICD-10-CM | POA: Insufficient documentation

## 2014-03-25 DIAGNOSIS — Z79899 Other long term (current) drug therapy: Secondary | ICD-10-CM | POA: Insufficient documentation

## 2014-03-25 DIAGNOSIS — Z8781 Personal history of (healed) traumatic fracture: Secondary | ICD-10-CM | POA: Insufficient documentation

## 2014-03-25 DIAGNOSIS — Z8701 Personal history of pneumonia (recurrent): Secondary | ICD-10-CM | POA: Insufficient documentation

## 2014-03-25 DIAGNOSIS — N390 Urinary tract infection, site not specified: Secondary | ICD-10-CM | POA: Insufficient documentation

## 2014-03-25 DIAGNOSIS — M129 Arthropathy, unspecified: Secondary | ICD-10-CM | POA: Insufficient documentation

## 2014-03-25 DIAGNOSIS — R55 Syncope and collapse: Secondary | ICD-10-CM | POA: Insufficient documentation

## 2014-03-25 DIAGNOSIS — I10 Essential (primary) hypertension: Secondary | ICD-10-CM | POA: Insufficient documentation

## 2014-03-25 DIAGNOSIS — I4891 Unspecified atrial fibrillation: Secondary | ICD-10-CM | POA: Insufficient documentation

## 2014-03-25 DIAGNOSIS — Z8673 Personal history of transient ischemic attack (TIA), and cerebral infarction without residual deficits: Secondary | ICD-10-CM | POA: Insufficient documentation

## 2014-03-25 LAB — URINALYSIS, ROUTINE W REFLEX MICROSCOPIC
BILIRUBIN URINE: NEGATIVE
Glucose, UA: NEGATIVE mg/dL
Ketones, ur: NEGATIVE mg/dL
Nitrite: NEGATIVE
PH: 5.5 (ref 5.0–8.0)
Protein, ur: 30 mg/dL — AB
Specific Gravity, Urine: 1.023 (ref 1.005–1.030)
UROBILINOGEN UA: 1 mg/dL (ref 0.0–1.0)

## 2014-03-25 LAB — URINE MICROSCOPIC-ADD ON

## 2014-03-25 LAB — CBC WITH DIFFERENTIAL/PLATELET
Basophils Absolute: 0 10*3/uL (ref 0.0–0.1)
Basophils Relative: 0 % (ref 0–1)
Eosinophils Absolute: 0.1 10*3/uL (ref 0.0–0.7)
Eosinophils Relative: 1 % (ref 0–5)
HCT: 29.5 % — ABNORMAL LOW (ref 39.0–52.0)
HEMOGLOBIN: 9.4 g/dL — AB (ref 13.0–17.0)
LYMPHS ABS: 1.8 10*3/uL (ref 0.7–4.0)
Lymphocytes Relative: 14 % (ref 12–46)
MCH: 28.8 pg (ref 26.0–34.0)
MCHC: 31.9 g/dL (ref 30.0–36.0)
MCV: 90.5 fL (ref 78.0–100.0)
MONOS PCT: 9 % (ref 3–12)
Monocytes Absolute: 1.2 10*3/uL — ABNORMAL HIGH (ref 0.1–1.0)
NEUTROS ABS: 9.7 10*3/uL — AB (ref 1.7–7.7)
NEUTROS PCT: 76 % (ref 43–77)
PLATELETS: 478 10*3/uL — AB (ref 150–400)
RBC: 3.26 MIL/uL — AB (ref 4.22–5.81)
RDW: 14.9 % (ref 11.5–15.5)
WBC: 12.8 10*3/uL — AB (ref 4.0–10.5)

## 2014-03-25 LAB — COMPREHENSIVE METABOLIC PANEL
ALK PHOS: 130 U/L — AB (ref 39–117)
ALT: 26 U/L (ref 0–53)
AST: 32 U/L (ref 0–37)
Albumin: 2.5 g/dL — ABNORMAL LOW (ref 3.5–5.2)
BUN: 16 mg/dL (ref 6–23)
CHLORIDE: 101 meq/L (ref 96–112)
CO2: 24 mEq/L (ref 19–32)
Calcium: 9.1 mg/dL (ref 8.4–10.5)
Creatinine, Ser: 0.87 mg/dL (ref 0.50–1.35)
GFR calc non Af Amer: 78 mL/min — ABNORMAL LOW (ref >90)
GFR, EST AFRICAN AMERICAN: 90 mL/min — AB (ref >90)
GLUCOSE: 177 mg/dL — AB (ref 70–99)
POTASSIUM: 3.4 meq/L — AB (ref 3.7–5.3)
Sodium: 139 mEq/L (ref 137–147)
TOTAL PROTEIN: 7.4 g/dL (ref 6.0–8.3)
Total Bilirubin: 0.4 mg/dL (ref 0.3–1.2)

## 2014-03-25 LAB — I-STAT TROPONIN, ED: Troponin i, poc: 0.01 ng/mL (ref 0.00–0.08)

## 2014-03-25 LAB — TROPONIN I: Troponin I: <0.3 ng/mL (ref <0.30)

## 2014-03-25 MED ORDER — DEXTROSE 5 % IV SOLN
1.0000 g | Freq: Once | INTRAVENOUS | Status: AC
  Administered 2014-03-25: 1 g via INTRAVENOUS
  Filled 2014-03-25: qty 10

## 2014-03-25 MED ORDER — SODIUM CHLORIDE 0.9 % IV BOLUS (SEPSIS)
500.0000 mL | Freq: Once | INTRAVENOUS | Status: AC
  Administered 2014-03-25: 500 mL via INTRAVENOUS

## 2014-03-25 MED ORDER — CEPHALEXIN 500 MG PO CAPS: 500.0000 mg | ORAL_CAPSULE | Freq: Four times a day (QID) | ORAL | Status: DC

## 2014-03-25 MED ORDER — HYDROCODONE-ACETAMINOPHEN 5-325 MG PO TABS
1.0000 | ORAL_TABLET | Freq: Once | ORAL | Status: AC
  Administered 2014-03-25: 1 via ORAL
  Filled 2014-03-25: qty 1

## 2014-03-25 MED ORDER — LIDOCAINE HCL 2 % EX GEL
Freq: Once | CUTANEOUS | Status: AC
  Administered 2014-03-25: 20 via URETHRAL
  Filled 2014-03-25: qty 20

## 2014-03-25 NOTE — ED Notes (Addendum)
Pt had stroke in March; lives at home; family takes care of him. New bed delivered today and was placed in wheelchair for install. Family reports that his eyes rolled in back of head and shook for 1 min. ? postictal. Pt has bedsore on coccyx area and indwelling foley. Pt's complaint is pain from bedsore.

## 2014-03-25 NOTE — ED Notes (Signed)
Pt rolled and repositioned to comfort.

## 2014-03-25 NOTE — ED Notes (Signed)
[facility] called to transport patient back home. RN called daughter, Dois Davenport to update her on pt's status.

## 2014-03-25 NOTE — ED Notes (Signed)
Pt continues to complain about foley catheter pain. RN assessed; breakdown noted at urethral tip; MD aware; orders for lidocaine jelly obtained.

## 2014-03-25 NOTE — Discharge Instructions (Signed)
Follow up with your md if any problems °

## 2014-03-25 NOTE — ED Provider Notes (Signed)
CSN: [id number]     Arrival date & time 03/25/14  1142 History   First MD Initiated Contact with Patient 03/25/14 1154     Chief Complaint  Patient presents with  . Loss of Consciousness     (Consider location/radiation/quality/duration/timing/severity/associated sxs/prior Treatment) Patient is a 80 y.o. male presenting with syncope. The history is provided by a relative (the pts daughter states he was sitting today and he pased out for 1-2 minutes.  pt now awake and alert).  Loss of Consciousness Episode history:  Single Most recent episode:  Today Timing:  Rare Progression:  Resolved Chronicity:  New Context: not blood draw   Associated symptoms: no chest pain, no headaches and no seizures     Past Medical History  Diagnosis Date  . A-fib   . Hypertension   . Rib fracture 12/29/2013    "fell in his home; due to the stroke"  . CVA (cerebral infarction)   . Encephalopathy   . Hypercholesterolemia   . Pneumonia 12/2013  . Stroke 12/29/2013    "weaker on left side since; vision, bladder, vision problems since" (02/19/2014)  . Arthritis     "knees; fingers" (02/19/2014)  . Foley catheter in place     "came into hospital w/it" (02/19/2014)   Past Surgical History  Procedure Laterality Date  . No past surgeries     History reviewed. No pertinent family history. History  Substance Use Topics  . Smoking status: Former Smoker -- 3 years    Types: Cigars, Pipe    Quit date: 06/23/1967  . Smokeless tobacco: Never Used  . Alcohol Use: Yes     Comment: "dx'd as an alcoholic; stopped in 2006"    Review of Systems  Constitutional: Negative for appetite change and fatigue.  HENT: Negative for congestion, ear discharge and sinus pressure.   Eyes: Negative for discharge.  Respiratory: Negative for cough.   Cardiovascular: Positive for syncope. Negative for chest pain.  Gastrointestinal: Negative for abdominal pain and diarrhea.  Genitourinary: Negative for frequency and hematuria.   Musculoskeletal: Negative for back pain.  Skin: Negative for rash.  Neurological: Negative for seizures and headaches.  Psychiatric/Behavioral: Negative for hallucinations.      Allergies  Review of patient's allergies indicates no known allergies.  Home Medications   Prior to Admission medications   Medication Sig Start Date End Date Taking? Authorizing Provider  acetaminophen (TYLENOL) 325 MG tablet Take 975 mg by mouth 3 (three) times daily.   Yes Historical Provider, MD  amLODipine (NORVASC) 10 MG tablet Take 10 mg by mouth daily.   Yes Historical Provider, MD  apixaban (ELIQUIS) 5 MG TABS tablet Take 5 mg by mouth 2 (two) times daily.   Yes Historical Provider, MD  atorvastatin (LIPITOR) 40 MG tablet Take 40 mg by mouth at bedtime.    Yes Historical Provider, MD  brinzolamide (AZOPT) 1 % ophthalmic suspension Place 1 drop into both eyes 2 (two) times daily.   Yes Historical Provider, MD  carvedilol (COREG) 12.5 MG tablet Take 12.5 mg by mouth 2 (two) times daily with a meal.   Yes Historical Provider, MD  gabapentin (NEURONTIN) 100 MG capsule Take 100 mg by mouth at bedtime. 03/14/14  Yes Historical Provider, MD  HYDROcodone-acetaminophen (NORCO/VICODIN) 5-325 MG per tablet Take 0.5 tablets by mouth every 8 (eight) hours as needed for moderate pain. 02/23/14  Yes Dorothea OgleIskra M Myers, MD  indomethacin (INDOCIN) 50 MG capsule Take 1 capsule (50 mg total) by mouth 3 (three)  times daily as needed for mild pain or moderate pain. 01/22/14  Yes Maryann Mikhail, DO  ipratropium-albuterol (DUONEB) 0.5-2.5 (3) MG/3ML SOLN Take 3 mLs by nebulization every 4 (four) hours as needed (shortness of breath).   Yes Historical Provider, MD  lidocaine (LIDODERM) 5 % Place 1 patch onto the skin daily. Remove & Discard patch within 12 hours or as directed by MD   Yes Historical Provider, MD  oxyCODONE-acetaminophen (PERCOCET/ROXICET) 5-325 MG per tablet Take 1 tablet by mouth 3 (three) times daily as needed for  moderate pain.  03/14/14  Yes Historical Provider, MD  polyethylene glycol (MIRALAX / GLYCOLAX) packet Take 17 g by mouth daily.   Yes Historical Provider, MD  sennosides-docusate sodium (SENOKOT-S) 8.6-50 MG tablet Take 2 tablets by mouth 2 (two) times daily.   Yes Historical Provider, MD  tetracycline (ACHROMYCIN,SUMYCIN) 250 MG capsule Take 250 mg by mouth 4 (four) times daily. Starting 03/21/14 for 10 days 03/21/14  Yes Historical Provider, MD  traMADol (ULTRAM) 50 MG tablet Take 0.5 tablets (25 mg total) by mouth every 8 (eight) hours. 02/23/14  Yes Dorothea Ogle, MD  cephALEXin (KEFLEX) 500 MG capsule Take 1 capsule (500 mg total) by mouth 4 (four) times daily. 03/25/14   Benny Lennert, MD   BP 112/63  Pulse 52  Temp(Src) 98.5 F (36.9 C) (Oral)  Resp 21  SpO2 98% Physical Exam  Constitutional: He appears well-developed.  HENT:  Head: Normocephalic.  Eyes: Conjunctivae and EOM are normal. No scleral icterus.  Neck: Neck supple. No thyromegaly present.  Cardiovascular: Normal rate and regular rhythm.  Exam reveals no gallop and no friction rub.   No murmur heard. Pulmonary/Chest: No stridor. He has no wheezes. He has no rales. He exhibits no tenderness.  Abdominal: He exhibits no distension. There is no tenderness. There is no rebound.  Musculoskeletal: He exhibits no edema.  Pt had old stroke and cannot move left arm or leg  Lymphadenopathy:    He has no cervical adenopathy.  Neurological: He is alert. He exhibits normal muscle tone. Coordination normal.  Skin:  Buttocks has small stage 3 decubitus ulcer  Psychiatric: He has a normal mood and affect. His behavior is normal.    ED Course  Procedures (including critical care time) Labs Review Labs Reviewed  CBC WITH DIFFERENTIAL - Abnormal; Notable for the following:    WBC 12.8 (*)    RBC 3.26 (*)    Hemoglobin 9.4 (*)    HCT 29.5 (*)    Platelets 478 (*)    Neutro Abs 9.7 (*)    Monocytes Absolute 1.2 (*)    All other  components within normal limits  COMPREHENSIVE METABOLIC PANEL - Abnormal; Notable for the following:    Potassium 3.4 (*)    Glucose, Bld 177 (*)    Albumin 2.5 (*)    Alkaline Phosphatase 130 (*)    GFR calc non Af Amer 78 (*)    GFR calc Af Amer 90 (*)    All other components within normal limits  URINALYSIS, ROUTINE W REFLEX MICROSCOPIC - Abnormal; Notable for the following:    APPearance CLOUDY (*)    Hgb urine dipstick MODERATE (*)    Protein, ur 30 (*)    Leukocytes, UA SMALL (*)    All other components within normal limits  URINE MICROSCOPIC-ADD ON - Abnormal; Notable for the following:    Squamous Epithelial / LPF FEW (*)    Bacteria, UA MANY (*)  Casts GRANULAR CAST (*)    Crystals CA OXALATE CRYSTALS (*)    All other components within normal limits  TROPONIN I  I-STAT TROPOININ, ED    Imaging Review Ct Head Wo Contrast  03/25/2014   CLINICAL DATA:  Loss of consciousness.  EXAM: CT HEAD WITHOUT CONTRAST  TECHNIQUE: Contiguous axial images were obtained from the base of the skull through the vertex without intravenous contrast.  COMPARISON:  01/19/2014 and MRI dated 01/10/2014  FINDINGS: No mass lesion. No midline shift. No acute hemorrhage or hematoma. No extra-axial fluid collections. No evidence of acute infarction. There are numerous old bilateral basal ganglia and periventricular white matter infarcts, unchanged since the prior exams. There is diffuse mild atrophy, most severe in the temporal lobes, stable.  No osseous abnormality.  IMPRESSION: No acute intracranial abnormality. Stable appearance of the brain since the prior study. Multiple old infarcts.   Electronically Signed   By: Geanie Cooley M.D.   On: 03/25/2014 14:16   Dg Chest Port 1 View  03/25/2014   CLINICAL DATA:  Syncope  EXAM: PORTABLE CHEST - 1 VIEW  COMPARISON:  02/19/2014  FINDINGS: Cardiomegaly noted with a slight increase vascular and interstitial prominence throughout both lungs. Early edema not  excluded. No pneumothorax. No large effusion evident. Minor basilar atelectasis. Degenerative changes of the spine.  IMPRESSION: Cardiomegaly with vascular and interstitial prominence compared to the prior study, suspect early edema pattern.   Electronically Signed   By: Ruel Favors M.D.   On: 03/25/2014 13:01     EKG Interpretation None      MDM   Final diagnoses:  Syncope  UTI (lower urinary tract infection)    Pt with uti and stable will have pt follow up with pcp    Benny Lennert, MD 03/25/14 [phone]

## 2014-03-25 NOTE — ED Notes (Signed)
PTR HAS ARRIVED TO TRANSPORT 

## 2014-03-25 NOTE — ED Notes (Addendum)
Pt's daughter at bedside; Pt has left sided deficits from stroke in March. Daughter reports that he passed out in chair for 1-2 mins and was diaphoretic. MD at bedside. Pt rolled and wound assessed. Pt has arthritis in left knee (lidocaine patch in place on left knee).

## 2014-03-25 NOTE — ED Notes (Addendum)
PT's daughter # [phone]; call with updates; pt full code; and has regular diet; is able to feed himself and drink liquids at home.

## 2014-03-25 NOTE — ED Notes (Signed)
Patient transported to CT 

## 2014-05-24 ENCOUNTER — Encounter (HOSPITAL_COMMUNITY): Payer: Self-pay | Admitting: Emergency Medicine

## 2014-05-24 ENCOUNTER — Emergency Department (HOSPITAL_COMMUNITY)
Admission: EM | Admit: 2014-05-24 | Discharge: 2014-05-25 | Disposition: A | Discharge status: Home / Self Care | Attending: Emergency Medicine | Admitting: Emergency Medicine

## 2014-05-24 DIAGNOSIS — Z8701 Personal history of pneumonia (recurrent): Secondary | ICD-10-CM | POA: Diagnosis not present

## 2014-05-24 DIAGNOSIS — M129 Arthropathy, unspecified: Secondary | ICD-10-CM | POA: Insufficient documentation

## 2014-05-24 DIAGNOSIS — Z8669 Personal history of other diseases of the nervous system and sense organs: Secondary | ICD-10-CM | POA: Insufficient documentation

## 2014-05-24 DIAGNOSIS — Z79899 Other long term (current) drug therapy: Secondary | ICD-10-CM | POA: Diagnosis not present

## 2014-05-24 DIAGNOSIS — E7800 Pure hypercholesterolemia, unspecified: Secondary | ICD-10-CM | POA: Insufficient documentation

## 2014-05-24 DIAGNOSIS — I4891 Unspecified atrial fibrillation: Secondary | ICD-10-CM | POA: Insufficient documentation

## 2014-05-24 DIAGNOSIS — I10 Essential (primary) hypertension: Secondary | ICD-10-CM | POA: Insufficient documentation

## 2014-05-24 DIAGNOSIS — T83091A Other mechanical complication of indwelling urethral catheter, initial encounter: Secondary | ICD-10-CM | POA: Diagnosis present

## 2014-05-24 DIAGNOSIS — Z792 Long term (current) use of antibiotics: Secondary | ICD-10-CM | POA: Diagnosis not present

## 2014-05-24 DIAGNOSIS — Z87311 Personal history of (healed) other pathological fracture: Secondary | ICD-10-CM | POA: Insufficient documentation

## 2014-05-24 DIAGNOSIS — Z87891 Personal history of nicotine dependence: Secondary | ICD-10-CM | POA: Insufficient documentation

## 2014-05-24 DIAGNOSIS — Z8673 Personal history of transient ischemic attack (TIA), and cerebral infarction without residual deficits: Secondary | ICD-10-CM | POA: Insufficient documentation

## 2014-05-24 DIAGNOSIS — N390 Urinary tract infection, site not specified: Secondary | ICD-10-CM | POA: Diagnosis not present

## 2014-05-24 DIAGNOSIS — T839XXA Unspecified complication of genitourinary prosthetic device, implant and graft, initial encounter: Secondary | ICD-10-CM

## 2014-05-24 DIAGNOSIS — Y846 Urinary catheterization as the cause of abnormal reaction of the patient, or of later complication, without mention of misadventure at the time of the procedure: Secondary | ICD-10-CM | POA: Insufficient documentation

## 2014-05-24 LAB — URINALYSIS, ROUTINE W REFLEX MICROSCOPIC
Glucose, UA: NEGATIVE mg/dL
Ketones, ur: NEGATIVE mg/dL
Nitrite: NEGATIVE
Protein, ur: 30 mg/dL — AB
SPECIFIC GRAVITY, URINE: 1.026 (ref 1.005–1.030)
Urobilinogen, UA: 1 mg/dL (ref 0.0–1.0)
pH: 6 (ref 5.0–8.0)

## 2014-05-24 LAB — CBC WITH DIFFERENTIAL/PLATELET
BASOS ABS: 0 10*3/uL (ref 0.0–0.1)
Basophils Relative: 0 % (ref 0–1)
EOS ABS: 0.5 10*3/uL (ref 0.0–0.7)
EOS PCT: 6 % — AB (ref 0–5)
HCT: 28 % — ABNORMAL LOW (ref 39.0–52.0)
Hemoglobin: 8.7 g/dL — ABNORMAL LOW (ref 13.0–17.0)
LYMPHS PCT: 30 % (ref 12–46)
Lymphs Abs: 2.2 10*3/uL (ref 0.7–4.0)
MCH: 27.6 pg (ref 26.0–34.0)
MCHC: 31.1 g/dL (ref 30.0–36.0)
MCV: 88.9 fL (ref 78.0–100.0)
Monocytes Absolute: 0.7 10*3/uL (ref 0.1–1.0)
Monocytes Relative: 10 % (ref 3–12)
NEUTROS PCT: 54 % (ref 43–77)
Neutro Abs: 4 10*3/uL (ref 1.7–7.7)
Platelets: 378 10*3/uL (ref 150–400)
RBC: 3.15 MIL/uL — ABNORMAL LOW (ref 4.22–5.81)
RDW: 15.6 % — AB (ref 11.5–15.5)
WBC: 7.3 10*3/uL (ref 4.0–10.5)

## 2014-05-24 LAB — COMPREHENSIVE METABOLIC PANEL
ALK PHOS: 93 U/L (ref 39–117)
AST: 10 U/L (ref 0–37)
Albumin: 2.1 g/dL — ABNORMAL LOW (ref 3.5–5.2)
Anion gap: 13 (ref 5–15)
BUN: 13 mg/dL (ref 6–23)
CALCIUM: 8.1 mg/dL — AB (ref 8.4–10.5)
CO2: 25 mEq/L (ref 19–32)
Chloride: 105 mEq/L (ref 96–112)
Creatinine, Ser: 0.65 mg/dL (ref 0.50–1.35)
GFR calc Af Amer: >90 mL/min (ref >90)
GFR calc non Af Amer: 87 mL/min — ABNORMAL LOW (ref >90)
Glucose, Bld: 109 mg/dL — ABNORMAL HIGH (ref 70–99)
POTASSIUM: 3.1 meq/L — AB (ref 3.7–5.3)
Sodium: 143 mEq/L (ref 137–147)
TOTAL PROTEIN: 6.2 g/dL (ref 6.0–8.3)
Total Bilirubin: 0.2 mg/dL — ABNORMAL LOW (ref 0.3–1.2)

## 2014-05-24 LAB — URINE MICROSCOPIC-ADD ON

## 2014-05-24 MED ORDER — SODIUM CHLORIDE 0.9 % IV BOLUS (SEPSIS)
1000.0000 mL | INTRAVENOUS | Status: AC
  Administered 2014-05-24: 1000 mL via INTRAVENOUS

## 2014-05-24 NOTE — ED Notes (Signed)
The tech has cleaned and applied a wet to dry dressing to the right hip. The tech has reported to the RN in charge.

## 2014-05-24 NOTE — ED Notes (Signed)
Patient presents to ED via [facility] from home. Patient has a permanent foley catheter and pulled it out himself today with balloon inflated. No bleeding noted at this time. Hx of CVA with left side paralysis. Hospice care and home health nurse helps with home health care. . No acute distress noted at this time.

## 2014-05-24 NOTE — ED Provider Notes (Signed)
CSN: [id number]     Arrival date & time 05/24/14  2117 History   First MD Initiated Contact with Patient 05/24/14 2126     Chief Complaint  Patient presents with  . pulled foley out      (Consider location/radiation/quality/duration/timing/severity/associated sxs/prior Treatment) HPI Comments: 80 year old male who presents with complaint of pulling out history catheter earlier today. He has done this several times previously. The catheter has all been replaced by hospice. The family presents for placement of the catheter. They're also concerned about the ulcers on his sacral area. The duration of his symptoms is constant. Severity is noted to be mild. He has had similar symptoms previously. Level V caveat due to dementia.    Past Medical History  Diagnosis Date  . A-fib   . Hypertension   . Rib fracture 12/29/2013    "fell in his home; due to the stroke"  . CVA (cerebral infarction)   . Encephalopathy   . Hypercholesterolemia   . Pneumonia 12/2013  . Stroke 12/29/2013    "weaker on left side since; vision, bladder, vision problems since" (02/19/2014)  . Arthritis     "knees; fingers" (02/19/2014)  . Foley catheter in place     "came into hospital w/it" (02/19/2014)   Past Surgical History  Procedure Laterality Date  . No past surgeries     No family history on file. History  Substance Use Topics  . Smoking status: Former Smoker -- 3 years    Types: Cigars, Pipe    Quit date: 06/23/1967  . Smokeless tobacco: Never Used  . Alcohol Use: Yes     Comment: "dx'd as an alcoholic; stopped in 2006"    Review of Systems  Constitutional: Negative for fever.  HENT: Negative for drooling and rhinorrhea.   Eyes: Negative for pain.  Respiratory: Negative for cough and shortness of breath.   Cardiovascular: Negative for chest pain and leg swelling.  Gastrointestinal: Negative for nausea, vomiting, abdominal pain and diarrhea.  Genitourinary: Negative for dysuria and hematuria.   Musculoskeletal: Negative for gait problem and neck pain.  Skin: Negative for color change.  Neurological: Negative for numbness and headaches.  Hematological: Negative for adenopathy.  Psychiatric/Behavioral: Negative for behavioral problems.  All other systems reviewed and are negative.     Allergies  Review of patient's allergies indicates no known allergies.  Home Medications   Prior to Admission medications   Medication Sig Start Date End Date Taking? Authorizing Provider  acetaminophen (TYLENOL) 325 MG tablet Take 975 mg by mouth 3 (three) times daily.    Historical Provider, MD  amLODipine (NORVASC) 10 MG tablet Take 10 mg by mouth daily.    Historical Provider, MD  apixaban (ELIQUIS) 5 MG TABS tablet Take 5 mg by mouth 2 (two) times daily.    Historical Provider, MD  atorvastatin (LIPITOR) 40 MG tablet Take 40 mg by mouth at bedtime.     Historical Provider, MD  brinzolamide (AZOPT) 1 % ophthalmic suspension Place 1 drop into both eyes 2 (two) times daily.    Historical Provider, MD  carvedilol (COREG) 12.5 MG tablet Take 12.5 mg by mouth 2 (two) times daily with a meal.    Historical Provider, MD  cephALEXin (KEFLEX) 500 MG capsule Take 1 capsule (500 mg total) by mouth 4 (four) times daily. 03/25/14   Benny Lennert, MD  gabapentin (NEURONTIN) 100 MG capsule Take 100 mg by mouth at bedtime. 03/14/14   Historical Provider, MD  HYDROcodone-acetaminophen (NORCO/VICODIN) 5-325 MG  per tablet Take 0.5 tablets by mouth every 8 (eight) hours as needed for moderate pain. 02/23/14   Dorothea Ogle, MD  indomethacin (INDOCIN) 50 MG capsule Take 1 capsule (50 mg total) by mouth 3 (three) times daily as needed for mild pain or moderate pain. 01/22/14   Maryann Mikhail, DO  ipratropium-albuterol (DUONEB) 0.5-2.5 (3) MG/3ML SOLN Take 3 mLs by nebulization every 4 (four) hours as needed (shortness of breath).    Historical Provider, MD  lidocaine (LIDODERM) 5 % Place 1 patch onto the skin daily.  Remove & Discard patch within 12 hours or as directed by MD    Historical Provider, MD  oxyCODONE-acetaminophen (PERCOCET/ROXICET) 5-325 MG per tablet Take 1 tablet by mouth 3 (three) times daily as needed for moderate pain.  03/14/14   Historical Provider, MD  polyethylene glycol (MIRALAX / GLYCOLAX) packet Take 17 g by mouth daily.    Historical Provider, MD  sennosides-docusate sodium (SENOKOT-S) 8.6-50 MG tablet Take 2 tablets by mouth 2 (two) times daily.    Historical Provider, MD  tetracycline (ACHROMYCIN,SUMYCIN) 250 MG capsule Take 250 mg by mouth 4 (four) times daily. Starting 03/21/14 for 10 days 03/21/14   Historical Provider, MD  traMADol (ULTRAM) 50 MG tablet Take 0.5 tablets (25 mg total) by mouth every 8 (eight) hours. 02/23/14   Dorothea Ogle, MD   SpO2 98% Physical Exam  Nursing note and vitals reviewed. Constitutional: He is oriented to person, place, and time. He appears well-developed and well-nourished.  HENT:  Head: Normocephalic and atraumatic.  Right Ear: External ear normal.  Left Ear: External ear normal.  Nose: Nose normal.  Mouth/Throat: Oropharynx is clear and moist. No oropharyngeal exudate.  Eyes: Conjunctivae and EOM are normal. Pupils are equal, round, and reactive to light.  Neck: Normal range of motion. Neck supple.  Cardiovascular: Normal rate, regular rhythm, normal heart sounds and intact distal pulses.  Exam reveals no gallop and no friction rub.   No murmur heard. Pulmonary/Chest: Effort normal and breath sounds normal. No respiratory distress. He has no wheezes.  Abdominal: Soft. Bowel sounds are normal. He exhibits no distension. There is no tenderness. There is no rebound and no guarding.  Genitourinary:  No evidence of traumatic injury to the urethra.  Musculoskeletal: Normal range of motion. He exhibits no edema and no tenderness.  Small sacral ulcer noted to the left distal tuberosity.  Large appearing sacral ulcer noted on the right ischial  tuberosity.  A wound VAC is attached to the central sacral area.  Ulcers to bilateral heels.     Neurological: He is alert and oriented to person, place, and time.  Skin: Skin is warm and dry.  Psychiatric: He has a normal mood and affect. His behavior is normal.    ED Course  Procedures (including critical care time) Labs Review Labs Reviewed  CBC WITH DIFFERENTIAL - Abnormal; Notable for the following:    RBC 3.15 (*)    Hemoglobin 8.7 (*)    HCT 28.0 (*)    RDW 15.6 (*)    Eosinophils Relative 6 (*)    All other components within normal limits  COMPREHENSIVE METABOLIC PANEL - Abnormal; Notable for the following:    Potassium 3.1 (*)    Glucose, Bld 109 (*)    Calcium 8.1 (*)    Albumin 2.1 (*)    Total Bilirubin 0.2 (*)    GFR calc non Af Amer 87 (*)    All other components within normal  limits  URINALYSIS, ROUTINE W REFLEX MICROSCOPIC - Abnormal; Notable for the following:    APPearance CLOUDY (*)    Hgb urine dipstick LARGE (*)    Bilirubin Urine SMALL (*)    Protein, ur 30 (*)    Leukocytes, UA MODERATE (*)    All other components within normal limits  URINE MICROSCOPIC-ADD ON - Abnormal; Notable for the following:    Squamous Epithelial / LPF FEW (*)    Bacteria, UA FEW (*)    All other components within normal limits    Imaging Review No results found.   EKG Interpretation None      MDM   Final diagnoses:  Foley catheter problem, initial encounter  UTI (lower urinary tract infection)    9:48 PM 80 y.o. male w hx of afib, HTN, CVA, encephalopathy who presents with complaint that he pulled out his urinary catheter. The family notes that he has done this several times previously. He is currently with hospice and they have been replacing the Foley catheter. The family states that he has been afebrile and otherwise has been doing well. They're concerned about the ongoing sacral and decubitus ulcers that he has. Vital signs are unremarkable here. The  patient is otherwise well appearing. He has chronic appearing wounds to his sacral area. Will get screening labs and imaging.  Possibly UTI, will cover w/ keflex. Do not think his sacral/heel wounds are infected, but must maintain good wound care. He currently has a wound vac. He is afebrile here, no leukocytosis, VS unremarkable, well appearing. Will place the pt in mittens so that family can use them prn at home to help prevent him from pulling out his foley. I left a message for care mgmt to call family tomorrow to see if there is anyway we can help them (setting up home health to aid them in his care, placement, etc).  I have discussed the diagnosis/risks/treatment options with the family and believe the pt to be eligible for discharge home to follow-up with his pcp as needed. We also discussed returning to the ED immediately if new or worsening sx occur. We discussed the sx which are most concerning (e.g., fever, vomiting) that necessitate immediate return. Medications administered to the patient during their visit and any new prescriptions provided to the patient are listed below.  Medications given during this visit Medications  sodium chloride 0.9 % bolus 1,000 mL (0 mLs Intravenous Stopped 05/25/14 0140)  cephALEXin (KEFLEX) capsule 500 mg (500 mg Oral Given 05/25/14 0037)    Discharge Medication List as of 05/25/2014 12:55 AM    START taking these medications   Details  cephALEXin (KEFLEX) 500 MG capsule Take 1 capsule (500 mg total) by mouth 4 (four) times daily., Starting 05/25/2014, Until Discontinued, Print           Purvis SheffieldForrest Tylyn Derwin, MD 05/25/14 1213

## 2014-05-24 NOTE — ED Notes (Signed)
Dr. Harrison at the bedside. 

## 2014-05-24 NOTE — ED Notes (Signed)
Shanda BumpsJessica, hospice nurse called to check on patient status.  She would like to be contacted if patient is sent back home or admitted, at [phone]

## 2014-05-25 MED ORDER — CEPHALEXIN 250 MG PO CAPS
500.0000 mg | ORAL_CAPSULE | Freq: Once | ORAL | Status: AC
  Administered 2014-05-25: 500 mg via ORAL
  Filled 2014-05-25: qty 2

## 2014-05-25 MED ORDER — CEPHALEXIN 500 MG PO CAPS: 500.0000 mg | ORAL_CAPSULE | Freq: Four times a day (QID) | ORAL | Status: DC

## 2014-05-25 NOTE — ED Notes (Signed)
Restraints ordered by Diplomatic Services operational officersecretary

## 2014-05-25 NOTE — ED Notes (Signed)
Mitten restraint applied to right hand

## 2014-05-25 NOTE — Progress Notes (Signed)
ED CM received VM from Dr. Romeo AppleHarrison EDP regarding possible transitional care HH/SNF placement assistance. Pt [facility] ED c/o pulling out indwelling foley cath. Reviewed record,  nurses note noted patient is a hospice patient. [facility] spoke with Samule OhmErin Osbourne RN. Patient is receiving HH services under Hospice. Family notified RN on call but refused to wait until nurse could arrive called [facility]. Foley was reinserted and patient returned home. [facility] notified and will resume services. Contacted and updated patient's daughter Dois DavenportSandra [phone] about resuming hospice care services. Daughter verbalized understanding and appreciation for follw-up call. Contact information given if any further questions or concerns should arise. No further ED CM needs identified.

## 2014-08-22 ENCOUNTER — Encounter (HOSPITAL_COMMUNITY): Payer: Self-pay | Admitting: Vascular Surgery

## 2014-08-22 ENCOUNTER — Inpatient Hospital Stay (HOSPITAL_COMMUNITY)
Admission: EM | Admit: 2014-08-22 | Discharge: 2014-09-01 | DRG: 377 | Disposition: A | Discharge status: Hospice | Attending: Internal Medicine | Admitting: Internal Medicine

## 2014-08-22 DIAGNOSIS — I4891 Unspecified atrial fibrillation: Secondary | ICD-10-CM | POA: Diagnosis present

## 2014-08-22 DIAGNOSIS — Z7901 Long term (current) use of anticoagulants: Secondary | ICD-10-CM | POA: Insufficient documentation

## 2014-08-22 DIAGNOSIS — K5791 Diverticulosis of intestine, part unspecified, without perforation or abscess with bleeding: Secondary | ICD-10-CM | POA: Diagnosis not present

## 2014-08-22 DIAGNOSIS — R509 Fever, unspecified: Secondary | ICD-10-CM

## 2014-08-22 DIAGNOSIS — Z515 Encounter for palliative care: Secondary | ICD-10-CM

## 2014-08-22 DIAGNOSIS — L89609 Pressure ulcer of unspecified heel, unspecified stage: Secondary | ICD-10-CM | POA: Diagnosis present

## 2014-08-22 DIAGNOSIS — Z87891 Personal history of nicotine dependence: Secondary | ICD-10-CM

## 2014-08-22 DIAGNOSIS — L89153 Pressure ulcer of sacral region, stage 3: Secondary | ICD-10-CM | POA: Diagnosis present

## 2014-08-22 DIAGNOSIS — E785 Hyperlipidemia, unspecified: Secondary | ICD-10-CM | POA: Diagnosis present

## 2014-08-22 DIAGNOSIS — M24552 Contracture, left hip: Secondary | ICD-10-CM | POA: Diagnosis present

## 2014-08-22 DIAGNOSIS — G9340 Encephalopathy, unspecified: Secondary | ICD-10-CM | POA: Diagnosis present

## 2014-08-22 DIAGNOSIS — L89159 Pressure ulcer of sacral region, unspecified stage: Secondary | ICD-10-CM | POA: Insufficient documentation

## 2014-08-22 DIAGNOSIS — K6289 Other specified diseases of anus and rectum: Secondary | ICD-10-CM

## 2014-08-22 DIAGNOSIS — B9620 Unspecified Escherichia coli [E. coli] as the cause of diseases classified elsewhere: Secondary | ICD-10-CM | POA: Diagnosis not present

## 2014-08-22 DIAGNOSIS — A419 Sepsis, unspecified organism: Secondary | ICD-10-CM | POA: Diagnosis not present

## 2014-08-22 DIAGNOSIS — I69354 Hemiplegia and hemiparesis following cerebral infarction affecting left non-dominant side: Secondary | ICD-10-CM

## 2014-08-22 DIAGNOSIS — Z79899 Other long term (current) drug therapy: Secondary | ICD-10-CM

## 2014-08-22 DIAGNOSIS — K5790 Diverticulosis of intestine, part unspecified, without perforation or abscess without bleeding: Secondary | ICD-10-CM | POA: Diagnosis present

## 2014-08-22 DIAGNOSIS — Z7401 Bed confinement status: Secondary | ICD-10-CM

## 2014-08-22 DIAGNOSIS — R109 Unspecified abdominal pain: Secondary | ICD-10-CM | POA: Diagnosis not present

## 2014-08-22 DIAGNOSIS — L89214 Pressure ulcer of right hip, stage 4: Secondary | ICD-10-CM | POA: Diagnosis present

## 2014-08-22 DIAGNOSIS — E876 Hypokalemia: Secondary | ICD-10-CM | POA: Diagnosis present

## 2014-08-22 DIAGNOSIS — D62 Acute posthemorrhagic anemia: Secondary | ICD-10-CM | POA: Diagnosis present

## 2014-08-22 DIAGNOSIS — K922 Gastrointestinal hemorrhage, unspecified: Secondary | ICD-10-CM | POA: Diagnosis present

## 2014-08-22 DIAGNOSIS — E870 Hyperosmolality and hypernatremia: Secondary | ICD-10-CM | POA: Diagnosis not present

## 2014-08-22 DIAGNOSIS — Z66 Do not resuscitate: Secondary | ICD-10-CM | POA: Diagnosis present

## 2014-08-22 DIAGNOSIS — L89613 Pressure ulcer of right heel, stage 3: Secondary | ICD-10-CM | POA: Diagnosis present

## 2014-08-22 DIAGNOSIS — N390 Urinary tract infection, site not specified: Secondary | ICD-10-CM | POA: Diagnosis not present

## 2014-08-22 DIAGNOSIS — D500 Iron deficiency anemia secondary to blood loss (chronic): Secondary | ICD-10-CM | POA: Insufficient documentation

## 2014-08-22 DIAGNOSIS — E43 Unspecified severe protein-calorie malnutrition: Secondary | ICD-10-CM | POA: Insufficient documentation

## 2014-08-22 DIAGNOSIS — D689 Coagulation defect, unspecified: Secondary | ICD-10-CM | POA: Diagnosis present

## 2014-08-22 DIAGNOSIS — K625 Hemorrhage of anus and rectum: Secondary | ICD-10-CM

## 2014-08-22 DIAGNOSIS — Z6820 Body mass index (BMI) 20.0-20.9, adult: Secondary | ICD-10-CM

## 2014-08-22 DIAGNOSIS — I10 Essential (primary) hypertension: Secondary | ICD-10-CM | POA: Diagnosis present

## 2014-08-22 DIAGNOSIS — I6990 Unspecified sequelae of unspecified cerebrovascular disease: Secondary | ICD-10-CM

## 2014-08-22 DIAGNOSIS — E119 Type 2 diabetes mellitus without complications: Secondary | ICD-10-CM

## 2014-08-22 DIAGNOSIS — R64 Cachexia: Secondary | ICD-10-CM | POA: Diagnosis present

## 2014-08-22 LAB — CBC
HCT: 21 % — ABNORMAL LOW (ref 39.0–52.0)
Hemoglobin: 7 g/dL — ABNORMAL LOW (ref 13.0–17.0)
MCH: 27.6 pg (ref 26.0–34.0)
MCHC: 32.9 g/dL (ref 30.0–36.0)
MCV: 84 fL (ref 78.0–100.0)
PLATELETS: 310 10*3/uL (ref 150–400)
RBC: 2.5 MIL/uL — ABNORMAL LOW (ref 4.22–5.81)
RDW: 16.6 % — ABNORMAL HIGH (ref 11.5–15.5)
WBC: 9 10*3/uL (ref 4.0–10.5)

## 2014-08-22 LAB — COMPREHENSIVE METABOLIC PANEL
ALBUMIN: 2.3 g/dL — AB (ref 3.5–5.2)
ALK PHOS: 67 U/L (ref 39–117)
AST: 10 U/L (ref 0–37)
Anion gap: 16 — ABNORMAL HIGH (ref 5–15)
BILIRUBIN TOTAL: 0.2 mg/dL — AB (ref 0.3–1.2)
BUN: 23 mg/dL (ref 6–23)
CHLORIDE: 103 meq/L (ref 96–112)
CO2: 22 mEq/L (ref 19–32)
Calcium: 8.2 mg/dL — ABNORMAL LOW (ref 8.4–10.5)
Creatinine, Ser: 1.16 mg/dL (ref 0.50–1.35)
GFR calc Af Amer: 65 mL/min — ABNORMAL LOW (ref >90)
GFR calc non Af Amer: 56 mL/min — ABNORMAL LOW (ref >90)
Glucose, Bld: 119 mg/dL — ABNORMAL HIGH (ref 70–99)
POTASSIUM: 3 meq/L — AB (ref 3.7–5.3)
Sodium: 141 mEq/L (ref 137–147)
Total Protein: 5.9 g/dL — ABNORMAL LOW (ref 6.0–8.3)

## 2014-08-22 LAB — PROTIME-INR
INR: 1.76 — ABNORMAL HIGH (ref 0.00–1.49)
Prothrombin Time: 20.7 seconds — ABNORMAL HIGH (ref 11.6–15.2)

## 2014-08-22 LAB — POC OCCULT BLOOD, ED: Fecal Occult Bld: POSITIVE — AB

## 2014-08-22 MED ORDER — MORPHINE SULFATE 4 MG/ML IJ SOLN
4.0000 mg | Freq: Once | INTRAMUSCULAR | Status: AC
  Administered 2014-08-22: 4 mg via INTRAVENOUS
  Filled 2014-08-22: qty 1

## 2014-08-22 MED ORDER — IOHEXOL 300 MG/ML  SOLN
25.0000 mL | Freq: Once | INTRAMUSCULAR | Status: AC | PRN
  Administered 2014-08-22: 25 mL via ORAL

## 2014-08-22 MED ORDER — ONDANSETRON HCL 4 MG/2ML IJ SOLN
4.0000 mg | Freq: Once | INTRAMUSCULAR | Status: AC
  Administered 2014-08-22: 4 mg via INTRAVENOUS
  Filled 2014-08-22: qty 2

## 2014-08-22 NOTE — ED Notes (Addendum)
Pt reports to the ED from home for eval of AMS. Pt is a hospice pt following a stroke. Pts family reported he had an episode of unresponsiveness and diaphoresis earlier today approx 1400. Pt was then arousal but he is only mumbling when per EMS he normally is able to speak in full sentences. Pt noted to have a large bloody BM upon assessment. Pts family reports that he has had 4 bloody stools today. Pts family reports the hospice nurse took him off of his Ibuprofen, Mobic, and Eloquis. Pt was disimpacted 2 weeks ago. BP 109/48, HR 64 and irregular. Pt has a hx of UTI and has a foley in place. Dark, malodorous, sediment filled urine noted in bag. Per EMS there are no new neuro deficits. Pt very contracted. Pt alert but does not answer questions appropriately. Resp e/u and skin warm and dry.

## 2014-08-23 ENCOUNTER — Encounter (HOSPITAL_COMMUNITY): Payer: Self-pay | Admitting: General Practice

## 2014-08-23 ENCOUNTER — Emergency Department (HOSPITAL_COMMUNITY)

## 2014-08-23 DIAGNOSIS — Z79899 Other long term (current) drug therapy: Secondary | ICD-10-CM | POA: Diagnosis not present

## 2014-08-23 DIAGNOSIS — K922 Gastrointestinal hemorrhage, unspecified: Secondary | ICD-10-CM | POA: Diagnosis present

## 2014-08-23 DIAGNOSIS — R64 Cachexia: Secondary | ICD-10-CM | POA: Diagnosis present

## 2014-08-23 DIAGNOSIS — D62 Acute posthemorrhagic anemia: Secondary | ICD-10-CM | POA: Diagnosis present

## 2014-08-23 DIAGNOSIS — L89609 Pressure ulcer of unspecified heel, unspecified stage: Secondary | ICD-10-CM | POA: Diagnosis present

## 2014-08-23 DIAGNOSIS — E870 Hyperosmolality and hypernatremia: Secondary | ICD-10-CM | POA: Diagnosis not present

## 2014-08-23 DIAGNOSIS — N390 Urinary tract infection, site not specified: Secondary | ICD-10-CM | POA: Diagnosis not present

## 2014-08-23 DIAGNOSIS — D689 Coagulation defect, unspecified: Secondary | ICD-10-CM

## 2014-08-23 DIAGNOSIS — L89153 Pressure ulcer of sacral region, stage 3: Secondary | ICD-10-CM | POA: Diagnosis present

## 2014-08-23 DIAGNOSIS — E876 Hypokalemia: Secondary | ICD-10-CM | POA: Diagnosis present

## 2014-08-23 DIAGNOSIS — Z7901 Long term (current) use of anticoagulants: Secondary | ICD-10-CM | POA: Diagnosis not present

## 2014-08-23 DIAGNOSIS — Z66 Do not resuscitate: Secondary | ICD-10-CM | POA: Diagnosis present

## 2014-08-23 DIAGNOSIS — K5791 Diverticulosis of intestine, part unspecified, without perforation or abscess with bleeding: Secondary | ICD-10-CM | POA: Diagnosis present

## 2014-08-23 DIAGNOSIS — I4891 Unspecified atrial fibrillation: Secondary | ICD-10-CM | POA: Diagnosis present

## 2014-08-23 DIAGNOSIS — L89613 Pressure ulcer of right heel, stage 3: Secondary | ICD-10-CM | POA: Diagnosis present

## 2014-08-23 DIAGNOSIS — Z7401 Bed confinement status: Secondary | ICD-10-CM | POA: Diagnosis not present

## 2014-08-23 DIAGNOSIS — E119 Type 2 diabetes mellitus without complications: Secondary | ICD-10-CM | POA: Diagnosis present

## 2014-08-23 DIAGNOSIS — E43 Unspecified severe protein-calorie malnutrition: Secondary | ICD-10-CM | POA: Diagnosis present

## 2014-08-23 DIAGNOSIS — Z515 Encounter for palliative care: Secondary | ICD-10-CM | POA: Diagnosis not present

## 2014-08-23 DIAGNOSIS — G9340 Encephalopathy, unspecified: Secondary | ICD-10-CM | POA: Diagnosis present

## 2014-08-23 DIAGNOSIS — Z87891 Personal history of nicotine dependence: Secondary | ICD-10-CM | POA: Diagnosis not present

## 2014-08-23 DIAGNOSIS — A419 Sepsis, unspecified organism: Secondary | ICD-10-CM | POA: Diagnosis not present

## 2014-08-23 DIAGNOSIS — R109 Unspecified abdominal pain: Secondary | ICD-10-CM | POA: Diagnosis present

## 2014-08-23 DIAGNOSIS — B9620 Unspecified Escherichia coli [E. coli] as the cause of diseases classified elsewhere: Secondary | ICD-10-CM | POA: Diagnosis not present

## 2014-08-23 DIAGNOSIS — I6990 Unspecified sequelae of unspecified cerebrovascular disease: Secondary | ICD-10-CM

## 2014-08-23 DIAGNOSIS — I69354 Hemiplegia and hemiparesis following cerebral infarction affecting left non-dominant side: Secondary | ICD-10-CM | POA: Diagnosis not present

## 2014-08-23 DIAGNOSIS — L89214 Pressure ulcer of right hip, stage 4: Secondary | ICD-10-CM | POA: Diagnosis present

## 2014-08-23 DIAGNOSIS — M24552 Contracture, left hip: Secondary | ICD-10-CM | POA: Diagnosis present

## 2014-08-23 DIAGNOSIS — Z6820 Body mass index (BMI) 20.0-20.9, adult: Secondary | ICD-10-CM | POA: Diagnosis not present

## 2014-08-23 DIAGNOSIS — E785 Hyperlipidemia, unspecified: Secondary | ICD-10-CM | POA: Diagnosis present

## 2014-08-23 DIAGNOSIS — K5790 Diverticulosis of intestine, part unspecified, without perforation or abscess without bleeding: Secondary | ICD-10-CM | POA: Diagnosis present

## 2014-08-23 DIAGNOSIS — I10 Essential (primary) hypertension: Secondary | ICD-10-CM | POA: Diagnosis present

## 2014-08-23 DIAGNOSIS — D500 Iron deficiency anemia secondary to blood loss (chronic): Secondary | ICD-10-CM | POA: Insufficient documentation

## 2014-08-23 LAB — URINE MICROSCOPIC-ADD ON

## 2014-08-23 LAB — PREPARE RBC (CROSSMATCH)

## 2014-08-23 LAB — URINALYSIS, ROUTINE W REFLEX MICROSCOPIC
Glucose, UA: NEGATIVE mg/dL
Ketones, ur: 15 mg/dL — AB
Nitrite: NEGATIVE
PH: 5.5 (ref 5.0–8.0)
Protein, ur: 100 mg/dL — AB
SPECIFIC GRAVITY, URINE: 1.023 (ref 1.005–1.030)
UROBILINOGEN UA: 0.2 mg/dL (ref 0.0–1.0)

## 2014-08-23 LAB — ABO/RH: ABO/RH(D): A POS

## 2014-08-23 LAB — HEMOGLOBIN AND HEMATOCRIT, BLOOD
HCT: 23.7 % — ABNORMAL LOW (ref 39.0–52.0)
HCT: 22.3 % — ABNORMAL LOW (ref 39.0–52.0)
Hemoglobin: 8.2 g/dL — ABNORMAL LOW (ref 13.0–17.0)
Hemoglobin: 7.7 g/dL — ABNORMAL LOW (ref 13.0–17.0)

## 2014-08-23 MED ORDER — SODIUM CHLORIDE 0.9 % IV SOLN
Freq: Once | INTRAVENOUS | Status: AC
  Administered 2014-08-23: 07:00:00 via INTRAVENOUS

## 2014-08-23 MED ORDER — AMLODIPINE BESYLATE 10 MG PO TABS
10.0000 mg | ORAL_TABLET | Freq: Every day | ORAL | Status: DC
  Administered 2014-08-23: 10 mg via ORAL
  Filled 2014-08-23 (×2): qty 1

## 2014-08-23 MED ORDER — ONDANSETRON HCL 4 MG/2ML IJ SOLN: 4.0000 mg | Freq: Four times a day (QID) | INTRAMUSCULAR | Status: DC | PRN

## 2014-08-23 MED ORDER — SODIUM CHLORIDE 0.9 % IJ SOLN
3.0000 mL | Freq: Two times a day (BID) | INTRAMUSCULAR | Status: DC
  Administered 2014-08-23 – 2014-09-01 (×16): 3 mL via INTRAVENOUS

## 2014-08-23 MED ORDER — FUROSEMIDE 10 MG/ML IJ SOLN
20.0000 mg | Freq: Once | INTRAMUSCULAR | Status: AC
  Administered 2014-08-23: 20 mg via INTRAVENOUS

## 2014-08-23 MED ORDER — MUPIROCIN 2 % EX OINT
1.0000 "application " | TOPICAL_OINTMENT | Freq: Two times a day (BID) | CUTANEOUS | Status: DC
  Administered 2014-08-24 – 2014-08-27 (×5): 1 via NASAL
  Filled 2014-08-23: qty 22

## 2014-08-23 MED ORDER — CARVEDILOL 12.5 MG PO TABS
12.5000 mg | ORAL_TABLET | Freq: Two times a day (BID) | ORAL | Status: DC
  Administered 2014-08-23 – 2014-08-27 (×8): 12.5 mg via ORAL
  Filled 2014-08-23 (×11): qty 1

## 2014-08-23 MED ORDER — ATORVASTATIN CALCIUM 40 MG PO TABS
40.0000 mg | ORAL_TABLET | Freq: Every day | ORAL | Status: DC
  Administered 2014-08-23 – 2014-08-31 (×8): 40 mg via ORAL
  Filled 2014-08-23 (×10): qty 1

## 2014-08-23 MED ORDER — SODIUM CHLORIDE 0.9 % IV SOLN
INTRAVENOUS | Status: DC
  Administered 2014-08-23: 08:00:00 via INTRAVENOUS

## 2014-08-23 MED ORDER — ACETAMINOPHEN 325 MG PO TABS: 650.0000 mg | ORAL_TABLET | Freq: Four times a day (QID) | ORAL | Status: DC | PRN

## 2014-08-23 MED ORDER — LORAZEPAM 0.5 MG PO TABS: 0.5000 mg | ORAL_TABLET | Freq: Four times a day (QID) | ORAL | Status: DC | PRN

## 2014-08-23 MED ORDER — ONDANSETRON HCL 4 MG PO TABS: 4.0000 mg | ORAL_TABLET | Freq: Four times a day (QID) | ORAL | Status: DC | PRN

## 2014-08-23 MED ORDER — IOHEXOL 300 MG/ML  SOLN
100.0000 mL | Freq: Once | INTRAMUSCULAR | Status: AC | PRN
  Administered 2014-08-23: 100 mL via INTRAVENOUS

## 2014-08-23 MED ORDER — ACETAMINOPHEN 650 MG RE SUPP
650.0000 mg | Freq: Four times a day (QID) | RECTAL | Status: DC | PRN
  Administered 2014-08-27: 650 mg via RECTAL
  Filled 2014-08-23: qty 1

## 2014-08-23 MED ORDER — PANTOPRAZOLE SODIUM 40 MG IV SOLR: 40.0000 mg | Freq: Two times a day (BID) | INTRAVENOUS | Status: DC

## 2014-08-23 MED ORDER — OXYCODONE HCL 5 MG PO TABS
5.0000 mg | ORAL_TABLET | ORAL | Status: DC | PRN
  Administered 2014-08-23 – 2014-08-25 (×6): 5 mg via ORAL
  Filled 2014-08-23 (×7): qty 1

## 2014-08-23 MED ORDER — ALUM & MAG HYDROXIDE-SIMETH 200-200-20 MG/5ML PO SUSP: 30.0000 mL | Freq: Four times a day (QID) | ORAL | Status: DC | PRN

## 2014-08-23 MED ORDER — BRINZOLAMIDE 1 % OP SUSP
1.0000 [drp] | Freq: Two times a day (BID) | OPHTHALMIC | Status: DC
  Administered 2014-08-23 – 2014-09-01 (×19): 1 [drp] via OPHTHALMIC
  Filled 2014-08-23: qty 10

## 2014-08-23 MED ORDER — SODIUM CHLORIDE 0.9 % IV SOLN
Freq: Once | INTRAVENOUS | Status: AC
Start: 2014-08-23 — End: 2014-08-23
  Administered 2014-08-23: 03:00:00 via INTRAVENOUS

## 2014-08-23 MED ORDER — HYDROMORPHONE HCL 1 MG/ML IJ SOLN
0.5000 mg | INTRAMUSCULAR | Status: DC | PRN
  Administered 2014-08-23 – 2014-08-27 (×4): 0.5 mg via INTRAVENOUS
  Administered 2014-08-28: 1 mg via INTRAVENOUS
  Administered 2014-08-28: 0.5 mg via INTRAVENOUS
  Administered 2014-08-29 – 2014-09-01 (×10): 1 mg via INTRAVENOUS
  Filled 2014-08-23 (×18): qty 1

## 2014-08-23 MED ORDER — PANTOPRAZOLE SODIUM 40 MG IV SOLR
40.0000 mg | Freq: Two times a day (BID) | INTRAVENOUS | Status: DC
  Administered 2014-08-23 – 2014-08-26 (×9): 40 mg via INTRAVENOUS
  Filled 2014-08-23 (×11): qty 40

## 2014-08-23 MED ORDER — HYDROMORPHONE HCL 1 MG/ML IJ SOLN
1.0000 mg | Freq: Once | INTRAMUSCULAR | Status: AC
  Administered 2014-08-23: 1 mg via INTRAVENOUS
  Filled 2014-08-23: qty 1

## 2014-08-23 NOTE — ED Provider Notes (Signed)
CSN: [id number]     Arrival date & time 08/22/14  2148 History   First MD Initiated Contact with Patient 08/22/14 2257     Chief Complaint  Patient presents with  . Altered Mental Status     (Consider location/radiation/quality/duration/timing/severity/associated sxs/prior Treatment) HPI 80 year old male presents to the emergency department from home with complaint of bloody stools throughout the day, lower abdominal pain with rectal pain, and change in mental status.  Patient is on hospice secondary to stroke.  Patient had a stroke earlier in the year, has loss of function to his left arm and leg.  He has contractures of the left hip.  Failure reports patient was extremely constipated 2 weeks ago requiring disimpaction.  Patient today had 4 bloody bowel movements attributed to a hemorrhoid.  Patient was taken off his ibuprofen Mobic, and Eliquis today.   Patient has indwelling Foley, recently finished up a course of antibiotics for urinary tract infection.  No fevers or chills.  Failure ports that patient was mumbling earlier and did not seem right.  There report that he is somewhat better at this time.  Patient is reported to have not followed with a doctor regularly prior to the stroke.  They do not know if he had a colonoscopy.  Past Medical History  Diagnosis Date  . A-fib   . Hypertension   . Rib fracture 12/29/2013    "fell in his home; due to the stroke"  . CVA (cerebral infarction)   . Encephalopathy   . Hypercholesterolemia   . Pneumonia 12/2013  . Stroke 12/29/2013    "weaker on left side since; vision, bladder, vision problems since" (02/19/2014)  . Arthritis     "knees; fingers" (02/19/2014)  . Foley catheter in place     "came into hospital w/it" (02/19/2014)   Past Surgical History  Procedure Laterality Date  . No past surgeries     History reviewed. No pertinent family history. History  Substance Use Topics  . Smoking status: Former Smoker -- 3 years    Types: Cigars, Pipe     Quit date: 06/23/1967  . Smokeless tobacco: Never Used  . Alcohol Use: Yes     Comment: "dx'd as an alcoholic; stopped in 2006"    Review of Systems  Review of systems is positive for generalized weakness, abdominal pain, blood in stool, he denies shortness of breath chest pain genital pain  Allergies  Review of patient's allergies indicates no known allergies.  Home Medications   Prior to Admission medications   Medication Sig Start Date End Date Taking? Authorizing Provider  amLODipine (NORVASC) 10 MG tablet Take 10 mg by mouth daily.   Yes Historical Provider, MD  apixaban (ELIQUIS) 5 MG TABS tablet Take 5 mg by mouth 2 (two) times daily.   Yes Historical Provider, MD  atorvastatin (LIPITOR) 40 MG tablet Take 40 mg by mouth at bedtime.    Yes Historical Provider, MD  brinzolamide (AZOPT) 1 % ophthalmic suspension Place 1 drop into both eyes 2 (two) times daily.   Yes Historical Provider, MD  carvedilol (COREG) 12.5 MG tablet Take 12.5 mg by mouth 2 (two) times daily with a meal.   Yes Historical Provider, MD  ibuprofen (ADVIL,MOTRIN) 600 MG tablet Take 600 mg by mouth 4 (four) times daily.   Yes Historical Provider, MD  lidocaine (LIDODERM) 5 % Place 1 patch onto the skin daily. Remove & Discard patch within 12 hours or as directed by MD   Yes Historical  Provider, MD  LORazepam (ATIVAN) 0.5 MG tablet Take 0.5 mg by mouth every 6 (six) hours as needed (agitation).   Yes Historical Provider, MD  mupirocin ointment (BACTROBAN) 2 % Place 1 application into the nose 2 (two) times daily.   Yes Historical Provider, MD  oxyCODONE-acetaminophen (PERCOCET/ROXICET) 5-325 MG per tablet Take 1 tablet by mouth 3 (three) times daily as needed for moderate pain.  03/14/14  Yes Historical Provider, MD  cephALEXin (KEFLEX) 500 MG capsule Take 1 capsule (500 mg total) by mouth 4 (four) times daily. 05/25/14   Purvis Sheffield, MD   BP 109/62 mmHg  Pulse 52  Resp 0  SpO2 100% Physical Exam   Constitutional: He is oriented to person, place, and time. He appears well-developed and well-nourished. He appears distressed.   Chronically ill appearing male, uncomfortable appearing  HENT:  Head: Normocephalic and atraumatic.  Nose: Nose normal.  Mouth/Throat: Oropharynx is clear and moist.  Eyes: Conjunctivae and EOM are normal. Pupils are equal, round, and reactive to light.  Neck: Normal range of motion. Neck supple. No JVD present. No tracheal deviation present. No thyromegaly present.  Cardiovascular: Normal heart sounds and intact distal pulses.  Exam reveals no gallop and no friction rub.   No murmur heard. Irregular rate and rhythm  Pulmonary/Chest: Effort normal and breath sounds normal. No stridor. No respiratory distress. He has no wheezes. He has no rales. He exhibits no tenderness.  Abdominal: Soft. Bowel sounds are normal. He exhibits no distension and no mass. There is no tenderness. There is no rebound and no guarding.  Genitourinary:  Patient has severe pain with any attempt at rectal exam.  No obvious external hemorrhoids noted.  He has noted skin breakdown around the anus.  There is no fluctuance noted.  Musculoskeletal: He exhibits no edema or tenderness.  Patient has contracture of left hip and left arm  Lymphadenopathy:    He has no cervical adenopathy.  Neurological: He is alert and oriented to person, place, and time. He displays normal reflexes. He exhibits normal muscle tone. Coordination normal.  Skin: Skin is warm and dry. No rash noted. No erythema. No pallor.  Psychiatric: He has a normal mood and affect. His behavior is normal. Judgment and thought content normal.  Nursing note and vitals reviewed.   ED Course  Procedures (including critical care time) Labs Review Labs Reviewed  CBC - Abnormal; Notable for the following:    RBC 2.50 (*)    Hemoglobin 7.0 (*)    HCT 21.0 (*)    RDW 16.6 (*)    All other components within normal limits   COMPREHENSIVE METABOLIC PANEL - Abnormal; Notable for the following:    Potassium 3.0 (*)    Glucose, Bld 119 (*)    Calcium 8.2 (*)    Total Protein 5.9 (*)    Albumin 2.3 (*)    Total Bilirubin 0.2 (*)    GFR calc non Af Amer 56 (*)    GFR calc Af Amer 65 (*)    Anion gap 16 (*)    All other components within normal limits  PROTIME-INR - Abnormal; Notable for the following:    Prothrombin Time 20.7 (*)    INR 1.76 (*)    All other components within normal limits  URINALYSIS, ROUTINE W REFLEX MICROSCOPIC - Abnormal; Notable for the following:    Color, Urine AMBER (*)    APPearance TURBID (*)    Hgb urine dipstick LARGE (*)  Bilirubin Urine SMALL (*)    Ketones, ur 15 (*)    Protein, ur 100 (*)    Leukocytes, UA LARGE (*)    All other components within normal limits  URINE MICROSCOPIC-ADD ON - Abnormal; Notable for the following:    Bacteria, UA MANY (*)    Crystals CA OXALATE CRYSTALS (*)    All other components within normal limits  POC OCCULT BLOOD, ED - Abnormal; Notable for the following:    Fecal Occult Bld POSITIVE (*)    All other components within normal limits  URINE CULTURE  TYPE AND SCREEN  ABO/RH    Imaging Review Ct Abdomen Pelvis W Contrast  08/23/2014   CLINICAL DATA:  Rectal bleeding.  Bloody stool.  EXAM: CT ABDOMEN AND PELVIS WITH CONTRAST  TECHNIQUE: Multidetector CT imaging of the abdomen and pelvis was performed using the standard protocol following bolus administration of intravenous contrast.  CONTRAST:  100mL OMNIPAQUE IOHEXOL 300 MG/ML  SOLN  COMPARISON:  CT 01/19/2014  FINDINGS: Lower chest: Lung bases are relatively clear. There is mild ground-glass opacity in the left lower lobe suggesting atelectasis. No pericardial fluid.  Hepatobiliary: No focal hepatic lesion. The gallbladder is distended to 4 cm. No evidence of inflammation.  Pancreas: Pancreas is normal. No duct dilatation. No pancreatic inflammation.  Spleen: Normal spleen   Adrenals/urinary tract: Adrenal glands kidneys normal. No a ureteral abnormality. Foley catheter in the bladder.  Stomach/Bowel: Stomach, small bowel, and cecum are normal. Normal volume stool through the colon. There are diverticula of the descending colon and sigmoid colon without acute inflammation. Rectum is grossly normal.  Vascular/Lymphatic: Abdominal aorta is normal caliber. There is no retroperitoneal or periportal lymphadenopathy. No pelvic lymphadenopathy.  Reproductive: Prostate small.  Musculoskeletal: No aggressive osseous lesion. There is degenerative change at the left hip. The left femur is elevated.  Other: No free fluid or abscess.  IMPRESSION: 1. No explanation for rectal bleeding. 2. Left colon and sigmoid diverticulosis without evidence of acute diverticulitis. 3. Mild right basilar atelectasis.   Electronically Signed   By: Genevive BiStewart  Edmunds M.D.   On: 08/23/2014 01:05     EKG Interpretation   Date/Time:  Friday August 22 2014 22:17:36 EST Ventricular Rate:  75 PR Interval:    QRS Duration: 127 QT Interval:  404 QTC Calculation: 451 R Axis:   31 Text Interpretation:  Atrial fibrillation Paired ventricular premature  complexes Nonspecific intraventricular conduction delay Borderline  repolarization abnormality No significant change since last tracing  Confirmed by Meri Pelot  MD, Avyan Livesay ([id number]) on 08/22/2014 11:01:19 PM     CRITICAL CARE Performed by: Olivia MackieTTER,Kauan Kloosterman M Total critical care time: 60 min Critical care time was exclusive of separately billable procedures and treating other patients. Critical care was necessary to treat or prevent imminent or life-threatening deterioration. Critical care was time spent personally by me on the following activities: development of treatment plan with patient and/or surrogate as well as nursing, discussions with consultants, evaluation of patient's response to treatment, examination of patient, obtaining history from patient or surrogate,  ordering and performing treatments and interventions, ordering and review of laboratory studies, ordering and review of radiographic studies, pulse oximetry and re-evaluation of patient's condition.  MDM   Final diagnoses:  Rectal bleed  Lower GI bleed  Blood loss anemia  Rectal pain    80 year old male, debilitated by a stroke on hospice who presents with abdominal pain and lower GI bleeding.  Hemoglobin is noted be lower than prior values in August.  He is hemodynamically stable.  CT scan was diverticulosis without diverticulitis.  Patient is anticoagulated, and is having bloody bowel movements.  Will discuss with hospitalist for admission.  At this time I have discussed with the family regarding blood transfusion.  They're discussing it, at this time appear to be wanting to watch and wait.  2:39 AM Patient is DO NOT RESUSCITATE.  Daughter at the bedside is healthcare power of attorney.  She has agreed to blood transfusion.  Discussed with hospitalist will admit.  Olivia Mackielga M Deliana Avalos, MD 08/23/14 [phone]

## 2014-08-23 NOTE — Consult Note (Signed)
Referring Provider: [facility] Primary Care Physician:  Georgann Housekeeper, MD Primary Gastroenterologist:  unassigned  Reason for Consultation:  GI bleed      HPI: Joel [name] is a 80 y.o. male Who presented to the emergency room early this morning with complaints of bloody stools accompanied with lower abdominal pain and rectal pain as well as a change in mental status. Patient is currently in hospice secondary to a stroke. He had a stroke earlier this year and has loss of function of his left arm and leg. He has contractures of the left hip. His family apparently reported to the emergency room that the patient was extremely constipated 2 weeks ago and required disimpaction though it is unclear where he had this done. The patient had 4 bloody movements yesterday which they attributed to a hemorrhoid. He had been on ibuprofen, mobic,, and Eliquis which were discontinued yesterday the patient has an indwelling Foley as he recently completed a course of antibiotics for a urinary tract infection he has not had any fever or chills. Review of his old chart shows that he was admitted with encephalopathy and transaminitis in April. His encephalopathy was felt to be due to her narcotic and his transaminitis was felt to be due to Levaquin. The patient does not remember following up with a gastroenterologist. The patient's daughter reported that the patient was passing large clots per rectum as well. In the emergency room he was found to have a hemoglobin of 7, and a CT of the abdomen revealed findings of diverticulosis. An FOBT was also performed and was heme positive.  Past Medical History  Diagnosis Date  . A-fib   . Hypertension   . Rib fracture 12/29/2013    "fell in his home; due to the stroke"  . CVA (cerebral infarction)   . Encephalopathy   . Hypercholesterolemia   . Pneumonia 12/2013  . Stroke 12/29/2013    "weaker on left side since; vision, bladder, vision problems since" (02/19/2014)  .  Arthritis     "knees; fingers" (02/19/2014)  . Foley catheter in place     "came into hospital w/it" (02/19/2014)    Past Surgical History  Procedure Laterality Date  . No past surgeries      Prior to Admission medications   Medication Sig Start Date End Date Taking? Authorizing Provider  amLODipine (NORVASC) 10 MG tablet Take 10 mg by mouth daily.   Yes Historical Provider, MD  apixaban (ELIQUIS) 5 MG TABS tablet Take 5 mg by mouth 2 (two) times daily.   Yes Historical Provider, MD  atorvastatin (LIPITOR) 40 MG tablet Take 40 mg by mouth at bedtime.    Yes Historical Provider, MD  brinzolamide (AZOPT) 1 % ophthalmic suspension Place 1 drop into both eyes 2 (two) times daily.   Yes Historical Provider, MD  carvedilol (COREG) 12.5 MG tablet Take 12.5 mg by mouth 2 (two) times daily with a meal.   Yes Historical Provider, MD  ibuprofen (ADVIL,MOTRIN) 600 MG tablet Take 600 mg by mouth 4 (four) times daily.   Yes Historical Provider, MD  lidocaine (LIDODERM) 5 % Place 1 patch onto the skin daily. Remove & Discard patch within 12 hours or as directed by MD   Yes Historical Provider, MD  LORazepam (ATIVAN) 0.5 MG tablet Take 0.5 mg by mouth every 6 (six) hours as needed (agitation).   Yes Historical Provider, MD  mupirocin ointment (BACTROBAN) 2 % Place 1 application into the nose 2 (two) times daily.  Yes Historical Provider, MD  oxyCODONE-acetaminophen (PERCOCET/ROXICET) 5-325 MG per tablet Take 1 tablet by mouth 3 (three) times daily as needed for moderate pain.  03/14/14  Yes Historical Provider, MD  cephALEXin (KEFLEX) 500 MG capsule Take 1 capsule (500 mg total) by mouth 4 (four) times daily. 05/25/14   Purvis SheffieldForrest Harrison, MD    Current Facility-Administered Medications  Medication Dose Route Frequency Provider Last Rate Last Dose  . 0.9 %  sodium chloride infusion   Intravenous Continuous Ron ParkerHarvette C Jenkins, MD      . acetaminophen (TYLENOL) tablet 650 mg  650 mg Oral Q6H PRN Ron ParkerHarvette C  Jenkins, MD       Or  . acetaminophen (TYLENOL) suppository 650 mg  650 mg Rectal Q6H PRN Ron ParkerHarvette C Jenkins, MD      . alum & mag hydroxide-simeth (MAALOX/MYLANTA) 200-200-20 MG/5ML suspension 30 mL  30 mL Oral Q6H PRN Ron ParkerHarvette C Jenkins, MD      . amLODipine (NORVASC) tablet 10 mg  10 mg Oral Daily Harvette Velora Heckler Jenkins, MD      . atorvastatin (LIPITOR) tablet 40 mg  40 mg Oral QHS Harvette Velora Heckler Jenkins, MD      . brinzolamide (AZOPT) 1 % ophthalmic suspension 1 drop  1 drop Both Eyes BID Harvette Velora Heckler Jenkins, MD      . carvedilol (COREG) tablet 12.5 mg  12.5 mg Oral BID WC Harvette Velora Heckler Jenkins, MD      . HYDROmorphone (DILAUDID) injection 0.5-1 mg  0.5-1 mg Intravenous Q3H PRN Ron ParkerHarvette C Jenkins, MD      . LORazepam (ATIVAN) tablet 0.5 mg  0.5 mg Oral Q6H PRN Ron ParkerHarvette C Jenkins, MD      . mupirocin ointment (BACTROBAN) 2 % 1 application  1 application Nasal BID Ron ParkerHarvette C Jenkins, MD      . ondansetron (ZOFRAN) tablet 4 mg  4 mg Oral Q6H PRN Ron ParkerHarvette C Jenkins, MD       Or  . ondansetron (ZOFRAN) injection 4 mg  4 mg Intravenous Q6H PRN Harvette Velora Heckler Jenkins, MD      . oxyCODONE (Oxy IR/ROXICODONE) immediate release tablet 5 mg  5 mg Oral Q4H PRN Ron ParkerHarvette C Jenkins, MD      . pantoprazole (PROTONIX) injection 40 mg  40 mg Intravenous Q12H Ron ParkerHarvette C Jenkins, MD   40 mg at 08/23/14 0648  . sodium chloride 0.9 % injection 3 mL  3 mL Intravenous Q12H Ron ParkerHarvette C Jenkins, MD        Allergies as of 08/22/2014  . (No Known Allergies)    History reviewed. No pertinent family history.  History   Social History  . Marital Status: Single    Spouse Name: N/A    Number of Children: N/A  . Years of Education: N/A   Occupational History  . Not on file.   Social History Main Topics  . Smoking status: Former Smoker -- 3 years    Types: Cigars, Pipe    Quit date: 06/23/1967  . Smokeless tobacco: Never Used  . Alcohol Use: No     Comment: "dx'd as an alcoholic; stopped in 2006"  . Drug Use: No  .  Sexual Activity: No   Other Topics Concern  . Not on file   Social History Narrative    Review of Systems: Gen: Denies any fever, chills CV: Denies chest pain, angina, palpitations, syncope, orthopnea, PND, peripheral edema, and claudication. Resp: Denies dyspnea at rest, dyspnea with exercise, cough, sputum, wheezing, coughing up  blood, and pleurisy. GI: Has abd pain, hematochezia, melena GU : Denies urinary burning, blood in urine, urinary frequency, urinary hesitancy, nocturnal urination, and urinary incontinence. MS: Denies joint pain  Derm: Denies rash  Psych: Denies depression, anxiety, memory loss, suicidal ideation, hallucinations, paranoia, and confusion. Neuro:  Denies any headaches, dizziness, paresthesias. Endo:  Denies any problems with DM, thyroid, adrenal function.  Physical Exam: Vital signs in last 24 hours: Temp:  [97.4 F (36.3 C)-98.6 F (37 C)] 97.9 F (36.6 C) (11/07 0558) Pulse Rate:  [25-92] 73 (11/07 0558) Resp:  [0-18] 18 (11/07 0558) BP: (80-126)/(40-73) 115/68 mmHg (11/07 0558) SpO2:  [94 %-100 %] 99 % (11/07 0558) Weight:  [134 lb 7.7 oz (61 kg)] 134 lb 7.7 oz (61 kg) (11/07 0438) Last BM Date: 08/23/14 General:   Alert, elderyt, contracted, NAD Head:  Normocephalic and atraumatic. Eyes:  Sclera clear, no icterus.   Conjunctiva pink. Ears:  Normal auditory acuity. Nose:  No deformity, discharge,  or lesions. Mouth:  No deformity or lesions.   Neck:  Supple; no masses or thyromegaly. Lungs:  Clear throughout to auscultation.   No wheezes, crackles, or rhonchi.  Heart:  Regular rate and rhythm; no murmurs, clicks, rubs,  or gallops. Abdomen:  Soft,mild right sided tenderness BS active,nonpalp mass or hsm.   Rectal:  Done by ED FOBT heme positiv Extremities:  LUE and LLE contracted Neurologic:  Alert and  oriented Skin:  Has sacral decubitus  Intake/Output from previous day: 11/06 0701 - 11/07 0700 In: 462.5 [I.V.:100; Blood:362.5] Out: -    Intake/Output this shift:    Lab Results:  Recent Labs  08/22/14 2258  WBC 9.0  HGB 7.0*  HCT 21.0*  PLT 310   BMET  Recent Labs  08/22/14 2258  NA 141  K 3.0*  CL 103  CO2 22  GLUCOSE 119*  BUN 23  CREATININE 1.16  CALCIUM 8.2*   LFT  Recent Labs  08/22/14 2258  PROT 5.9*  ALBUMIN 2.3*  AST 10  ALT <5  ALKPHOS 67  BILITOT 0.2*   PT/INR  Recent Labs  08/22/14 2258  LABPROT 20.7*  INR 1.76*    Studies/Results: Ct Abdomen Pelvis W Contrast  08/23/2014   CLINICAL DATA:  Rectal bleeding.  Bloody stool.  EXAM: CT ABDOMEN AND PELVIS WITH CONTRAST  TECHNIQUE: Multidetector CT imaging of the abdomen and pelvis was performed using the standard protocol following bolus administration of intravenous contrast.  CONTRAST:  100mL OMNIPAQUE IOHEXOL 300 MG/ML  SOLN  COMPARISON:  CT 01/19/2014  FINDINGS: Lower chest: Lung bases are relatively clear. There is mild ground-glass opacity in the left lower lobe suggesting atelectasis. No pericardial fluid.  Hepatobiliary: No focal hepatic lesion. The gallbladder is distended to 4 cm. No evidence of inflammation.  Pancreas: Pancreas is normal. No duct dilatation. No pancreatic inflammation.  Spleen: Normal spleen  Adrenals/urinary tract: Adrenal glands kidneys normal. No a ureteral abnormality. Foley catheter in the bladder.  Stomach/Bowel: Stomach, small bowel, and cecum are normal. Normal volume stool through the colon. There are diverticula of the descending colon and sigmoid colon without acute inflammation. Rectum is grossly normal.  Vascular/Lymphatic: Abdominal aorta is normal caliber. There is no retroperitoneal or periportal lymphadenopathy. No pelvic lymphadenopathy.  Reproductive: Prostate small.  Musculoskeletal: No aggressive osseous lesion. There is degenerative change at the left hip. The left femur is elevated.  Other: No free fluid or abscess.  IMPRESSION: 1. No explanation for rectal bleeding. 2. Left colon and  sigmoid  diverticulosis without evidence of acute diverticulitis. 3. Mild right basilar atelectasis.   Electronically Signed   By: Genevive Bi M.D.   On: 08/23/2014 01:05    IMPRESSION/PLAN: 1. 80 yo male admitted with GI bleed. Was on eliquis which has been stopped.  On protonix, has 2 u prbcs ordered. ? Diverticular bleed. Will review with Dr Rhea Belton re: observation vs diagnostic endoscopic eval.    Kalena Mander, Tollie Pizza PA-C 08/23/2014,  Pager [phone]

## 2014-08-23 NOTE — Progress Notes (Addendum)
[facility]  80 y/o male admitted with GI bleed. Pt evaluated, chart reviewed and plan discussed with daughter today.  Principal Problem:   GI bleed- anemia due to acute blood loss-  Diverticulosis noted on CT  - has been on Eliquis for A-fib - on hold now - on BID protonix - GI consulted - 2 U PRBC transfused - clear liquids   Active Problems:    Atrial fibrillation - rate controlled - Eliquis on hold    HTN (hypertension) - cont Coreg and Norvasc    Sacral decubitus ulcers, stage III - per daughter, both have been healing well - chronic foley to prevent infection of ulcers?    Late effects of CVA  - essentially bedbound   Chronic foley     Decubitus ulcer of heel

## 2014-08-23 NOTE — ED Notes (Signed)
Pt becoming increasingly confused

## 2014-08-23 NOTE — Progress Notes (Signed)
Admitted pt to rm 3E18 from ED via stretcher, pt came with 1st unit of blood transfusing, pt to get 2 units of RBC, hgb is 7.0. Pt alert to self, daughter at bedside, admission history given by pt. Pt has pressure ulcers to sacrum and right hip and bilateral heel, dressing changed. Will continue to monitor.   08/23/14 0438  Vitals  Temp 97.4 F (36.3 C)  Temp Source Oral  BP (!) 123/40 mmHg  BP Location Right Arm  Pulse Rate 91  Resp 18  Oxygen Therapy  SpO2 100 %  O2 Device Room Air  Height and Weight  Height 5\' 9"  (1.753 m)  Weight 61 kg (134 lb 7.7 oz)  Type of Scale Used Bed  BSA (Calculated - sq m) 1.72 sq meters  BMI (Calculated) 19.9  Weight in (lb) to have BMI = 25 168.9

## 2014-08-23 NOTE — H&P (Signed)
[facility] Admission History and Physical       Joel [name] Joel [name]:[id number] DOB: 07/16/1928 DOA: 08/22/2014  Referring physician:  EDP PCP: Joel HousekeeperHUSAIN,KARRAR, MD  Specialists:   Chief Complaint:  Bloody Stools  HPI: Joel [name] Amedee is [name] 80 y.o. male with [name] history of [name] CVA with Left Hemiparesis in 12/2013, Atrial fibrillation on Eliquis Rx, HTN, and Hyperlipidemia who was brought to the ED after he was observed to pass dark red bloody stools and complain of ABD pain.    His daughter reports that he was  passing large clots as well.   He had decreased responsiveness and lethargy as compared to his baseline.   In the ED her was found to have [name] hemoglobin level of 7.0, and [name] CT ABD  Revealed findings of Diverticulosis.    An FOBT was also performed and was HEME+.  He was referred for medical admission.     Review of Systems:  Constitutional: No Weight Loss, No Weight Gain, Night Sweats, Fevers, Chills, Dizziness, Fatigue, or Generalized Weakness HEENT: No Headaches, Difficulty Swallowing,Tooth/Dental Problems,Sore Throat,  No Sneezing, Rhinitis, Ear Ache, Nasal Congestion, or Post Nasal Drip,  Cardio-vascular:  No Chest pain, Orthopnea, PND, Edema in Lower Extremities, Anasarca, Dizziness, Palpitations  Resp: No Dyspnea, No DOE, No Productive Cough, No Non-Productive Cough, No Hemoptysis, No Wheezing.    GI: No Heartburn, Indigestion,+Abdominal Pain, Nausea, Vomiting, Diarrhea, Hematemesis, Hematochezia, Melena, Change in Bowel Habits,  Loss of Appetite  GU: No Dysuria, Change in Color of Urine, No Urgency or Frequency, No Flank pain.  Musculoskeletal: No Joint Pain or Swelling, No Decreased Range of Motion, No Back Pain.  Neurologic: No Syncope, No Seizures, Muscle Weakness, Paresthesia, Vision Disturbance or Loss, No Diplopia, No Vertigo, No Difficulty Walking,  Skin: No Rash or Lesions. Psych: No Change in Mood or Affect, No Depression or Anxiety, No Memory loss, No Confusion, or  Hallucinations   Past Medical History  Diagnosis Date  . [name]-fib   . Hypertension   . Rib fracture 12/29/2013    "fell in his home; due to the stroke"  . CVA (cerebral infarction)   . Encephalopathy   . Hypercholesterolemia   . Pneumonia 12/2013  . Stroke 12/29/2013    "weaker on left side since; vision, bladder, vision problems since" (02/19/2014)  . Arthritis     "knees; fingers" (02/19/2014)  . Foley catheter in place     "came into hospital w/it" (02/19/2014)      Past Surgical History  Procedure Laterality Date  . No past surgeries         Prior to Admission medications   Medication Sig Start Date End Date Taking? Authorizing Provider  amLODipine (NORVASC) 10 MG tablet Take 10 mg by mouth daily.   Yes Historical Provider, MD  apixaban (ELIQUIS) 5 MG TABS tablet Take 5 mg by mouth 2 (two) times daily.   Yes Historical Provider, MD  atorvastatin (LIPITOR) 40 MG tablet Take 40 mg by mouth at bedtime.    Yes Historical Provider, MD  brinzolamide (AZOPT) 1 % ophthalmic suspension Place 1 drop into both eyes 2 (two) times daily.   Yes Historical Provider, MD  carvedilol (COREG) 12.5 MG tablet Take 12.5 mg by mouth 2 (two) times daily with [name] meal.   Yes Historical Provider, MD  ibuprofen (ADVIL,MOTRIN) 600 MG tablet Take 600 mg by mouth 4 (four) times daily.   Yes Historical Provider, MD  lidocaine (LIDODERM) 5 % Place 1 patch onto the  skin daily. Remove & Discard patch within 12 hours or as directed by MD   Yes Historical Provider, MD  LORazepam (ATIVAN) 0.5 MG tablet Take 0.5 mg by mouth every 6 (six) hours as needed (agitation).   Yes Historical Provider, MD  mupirocin ointment (BACTROBAN) 2 % Place 1 application into the nose 2 (two) times daily.   Yes Historical Provider, MD  oxyCODONE-acetaminophen (PERCOCET/ROXICET) 5-325 MG per tablet Take 1 tablet by mouth 3 (three) times daily as needed for moderate pain.  03/14/14  Yes Historical Provider, MD  cephALEXin (KEFLEX) 500 MG capsule  Take 1 capsule (500 mg total) by mouth 4 (four) times daily. 05/25/14   Purvis Sheffield, MD      No Known Allergies   Social History:  reports that he quit smoking about 47 years ago. His smoking use included Cigars and Pipe. He has never used smokeless tobacco. He reports that he drinks alcohol. He reports that he does not use illicit drugs.     History reviewed. No pertinent family history.     Physical Exam:  GEN:  Pleasant thin Elderly Bedridden and Contracted 80 y.o.  African American male examined and in no acute distress; cooperative with exam Filed Vitals:   08/23/14 0200 08/23/14 0215 08/23/14 0245 08/23/14 0300  BP: 90/45 111/62 107/53 123/51  Pulse: 82 55 66 89  Resp:      SpO2: 94% 100% 99% 100%   Blood pressure 123/51, pulse 89, resp. rate 0, SpO2 100 %. PSYCH: He is alert and oriented x4; does not appear anxious does not appear depressed; affect is normal HEENT: Normocephalic and Atraumatic, Mucous membranes pink; PERRLA; EOM intact; Fundi:  Benign;  No scleral icterus, Nares: Patent, Oropharynx: Clear, Fair Dentition,    Neck:  FROM, No Cervical Lymphadenopathy nor Thyromegaly or Carotid Bruit; No JVD; Breasts:: Not examined CHEST WALL: No tenderness CHEST: Normal respiration, clear to auscultation bilaterally HEART: Regular rate and rhythm; no murmurs rubs or gallops BACK: No kyphosis or scoliosis; No CVA tenderness    ABDOMEN: Positive Bowel Sounds, Scaphoid  Soft Non-Tender; No Masses, No Organomegaly. Rectal Exam: done by EDP FOBT: HEME ++ EXTREMITIES: + contractures of Left Upper and Lower Exts;  +Atrophy of BLEs,  No Cyanosis, Clubbing, or Edema; Genitalia: NMG,  Foley catheter present  PULSES: 2+ and symmetric SKIN: Normal hydration no rash or ulceration CNS:  Alert and Oriented x 4, No Focal Deficits Vascular: pulses palpable throughout    Labs on Admission:  Basic Metabolic Panel:  Recent Labs Lab 08/22/14 2258  NA 141  K 3.0*  CL 103  CO2 22   GLUCOSE 119*  BUN 23  CREATININE 1.16  CALCIUM 8.2*   Liver Function Tests:  Recent Labs Lab 08/22/14 2258  AST 10  ALT <5  ALKPHOS 67  BILITOT 0.2*  PROT 5.9*  ALBUMIN 2.3*   No results for input(s): LIPASE, AMYLASE in the last 168 hours. No results for input(s): AMMONIA in the last 168 hours. CBC:  Recent Labs Lab 08/22/14 2258  WBC 9.0  HGB 7.0*  HCT 21.0*  MCV 84.0  PLT 310   Cardiac Enzymes: No results for input(s): CKTOTAL, CKMB, CKMBINDEX, TROPONINI in the last 168 hours.  BNP (last 3 results) No results for input(s): PROBNP in the last 8760 hours. CBG: No results for input(s): GLUCAP in the last 168 hours.  Radiological Exams on Admission: Ct Abdomen Pelvis W Contrast  08/23/2014   CLINICAL DATA:  Rectal bleeding.  Bloody  stool.  EXAM: CT ABDOMEN AND PELVIS WITH CONTRAST  TECHNIQUE: Multidetector CT imaging of the abdomen and pelvis was performed using the standard protocol following bolus administration of intravenous contrast.  CONTRAST:  100mL OMNIPAQUE IOHEXOL 300 MG/ML  SOLN  COMPARISON:  CT 01/19/2014  FINDINGS: Lower chest: Lung bases are relatively clear. There is mild ground-glass opacity in the left lower lobe suggesting atelectasis. No pericardial fluid.  Hepatobiliary: No focal hepatic lesion. The gallbladder is distended to 4 cm. No evidence of inflammation.  Pancreas: Pancreas is normal. No duct dilatation. No pancreatic inflammation.  Spleen: Normal spleen  Adrenals/urinary tract: Adrenal glands kidneys normal. No [name] ureteral abnormality. Foley catheter in the bladder.  Stomach/Bowel: Stomach, small bowel, and cecum are normal. Normal volume stool through the colon. There are diverticula of the descending colon and sigmoid colon without acute inflammation. Rectum is grossly normal.  Vascular/Lymphatic: Abdominal aorta is normal caliber. There is no retroperitoneal or periportal lymphadenopathy. No pelvic lymphadenopathy.  Reproductive: Prostate small.   Musculoskeletal: No aggressive osseous lesion. There is degenerative change at the left hip. The left femur is elevated.  Other: No free fluid or abscess.  IMPRESSION: 1. No explanation for rectal bleeding. 2. Left colon and sigmoid diverticulosis without evidence of acute diverticulitis. 3. Mild right basilar atelectasis.   Electronically Signed   By: Genevive BiStewart  Edmunds M.D.   On: 08/23/2014 01:05        Assessment/Plan:   80 y.o. male with  Principal Problem:   1.   GI bleed   Monitor H/Hs   Transfuse 2 units   IV Protonix   Active Problems:   2.   Diverticulosis- seen on CT scan     3.   Acute encephalopathy   monitor     4.   Atrial fibrillation   On Eliquis , Discontinued due to #1    5.   HTN (hypertension)   On      6.   Dyslipidemia   On Lipitor Rx     7.   Sacral decubitus ulcer, stage III   Wound care eval for rx     8.   Diabetes mellitus without complication   Continue    SSi coverage PRN   Check HbA1C in AM     9.   Coagulopathy   Hold Eliquis Rx    10.  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  Hypokalemia   Replete K+ and Check magnesium level        Code Status:       DO NOT RESUSCITATE (DNR) Family Communication:    Daughter at Bedside Disposition Plan:       Inaptient  Time spent:  6260 Minutes  Ron ParkerJENKINS,Diva Lemberger C [facility] Pager [phone]   If 7AM -7PM Please Contact the Day Rounding Team MD for [facility]  If 7PM-7AM, Please Contact Night-Floor Coverage  [email or web address] Password [email or web address] 08/23/2014, 3:10 AM

## 2014-08-23 NOTE — Consult Note (Signed)
  WOC wound consult note Reason for Consult: Multiple ulcers Wound type:Pressure and device related pressure ulcer Pressure Ulcer POA: Yes Measurement: Right heel:  3cm x 1cm x 0.5cm Stage III Pressure Ulcer with plae pink base, minimal drainage (serous), no odor.  Right Hip with Stage IV pRessure Ulcer:  5cm x 5cm x 1cm with 3cm undermining from 1-3 o'clock and 2cm at 4 o'clock.  Closed wound edges (epibole) circumferentially. Pale wound bed with 25% necrotic tissue (loose hanging, white at 12 o'clock.  I am able to debride this after cleansing with betadine swabstick and allowing to dry without bleeding or pain. Following procedure, wound is with 10% white necrotic tissue in this area. Minimal amount serous exudate from this ulcer.  Sacral wound measures 5cm x 4c, x 1cm with 1.5cm undermining from 12-6 o'clock. Red wound base, minimal amount serous exudate. Right side of penis:  Medical device related pressure ulcer (full thickness) measuring 4cm x 1cm x 0.5cm pink, moist base, serous drainage. Wound bed:As described above. Drainage (amount, consistency, odor) As described above. Periwound:Intact, dry. Dressing procedure/placement/frequency:I will provide nursing with guidance for care of the pressure ulcerations.  The penile erosion may require evaluation by urology to see if a suprapubic catheter might be better as this area is healing. If you agree, please order. Conservative sharp wound debridement (CSWD performed at the bedside): Yes, at right hip.  See above. WOC nursing team will not follow, but will remain available to this patient, the nursing and medical teams. Please re-consult if needed. Thanks, Ladona MowLaurie Gaynell Eggleton, MSN, RN, GNP, ButlerWOCN, CWON-AP [phone])

## 2014-08-24 LAB — HEMOGLOBIN AND HEMATOCRIT, BLOOD
HCT: 20.9 % — ABNORMAL LOW (ref 39.0–52.0)
HCT: 19.4 % — ABNORMAL LOW (ref 39.0–52.0)
HEMOGLOBIN: 6.7 g/dL — AB (ref 13.0–17.0)
Hemoglobin: 7.1 g/dL — ABNORMAL LOW (ref 13.0–17.0)

## 2014-08-24 LAB — BASIC METABOLIC PANEL
ANION GAP: 16 — AB (ref 5–15)
BUN: 30 mg/dL — AB (ref 6–23)
CHLORIDE: 106 meq/L (ref 96–112)
CO2: 20 mEq/L (ref 19–32)
Calcium: 8 mg/dL — ABNORMAL LOW (ref 8.4–10.5)
Creatinine, Ser: 1.31 mg/dL (ref 0.50–1.35)
GFR calc non Af Amer: 48 mL/min — ABNORMAL LOW (ref >90)
GFR, EST AFRICAN AMERICAN: 56 mL/min — AB (ref >90)
Glucose, Bld: 128 mg/dL — ABNORMAL HIGH (ref 70–99)
POTASSIUM: 3.2 meq/L — AB (ref 3.7–5.3)
Sodium: 142 mEq/L (ref 137–147)

## 2014-08-24 LAB — MAGNESIUM: Magnesium: 1.6 mg/dL (ref 1.5–2.5)

## 2014-08-24 LAB — PREPARE RBC (CROSSMATCH)

## 2014-08-24 LAB — PROTIME-INR
INR: 1.54 — AB (ref 0.00–1.49)
Prothrombin Time: 18.6 seconds — ABNORMAL HIGH (ref 11.6–15.2)

## 2014-08-24 MED ORDER — AMLODIPINE BESYLATE 10 MG PO TABS
10.0000 mg | ORAL_TABLET | Freq: Every day | ORAL | Status: DC
Start: 2014-08-25 — End: 2014-09-01
  Administered 2014-08-25 – 2014-09-01 (×6): 10 mg via ORAL
  Filled 2014-08-24 (×8): qty 1

## 2014-08-24 MED ORDER — POTASSIUM CHLORIDE 10 MEQ/100ML IV SOLN
10.0000 meq | INTRAVENOUS | Status: AC
  Administered 2014-08-24 (×2): 10 meq via INTRAVENOUS
  Filled 2014-08-24 (×2): qty 100

## 2014-08-24 MED ORDER — POTASSIUM CHLORIDE CRYS ER 20 MEQ PO TBCR
40.0000 meq | EXTENDED_RELEASE_TABLET | ORAL | Status: AC
  Administered 2014-08-24 – 2014-08-25 (×2): 40 meq via ORAL

## 2014-08-24 MED ORDER — SODIUM CHLORIDE 0.9 % IV SOLN: Freq: Once | INTRAVENOUS | Status: DC

## 2014-08-24 NOTE — Progress Notes (Signed)
[facility] Progress Note   Joel Nunneryeter A Bonny FAO:[id number] DOB: 01/14/1928 DOA: 08/22/2014 PCP: Georgann HousekeeperHUSAIN,KARRAR, MD  Brief narrative: Joel [name] is a 80 y.o. male with history of CVA with left-sided hemiparesis, atrial fibrillation on Eliquis,hypertension and hyperlipidemia and severe decubitus ulcers who presents to the hospital with bloody stools and decreased responsiveness.   Subjective: Awake and alert admits to having one bloody stool this morning. No abdominal pain.  Assessment/Plan: Principal Problem:  GI bleed- anemia due to acute blood loss- Diverticulosis noted on CT  - has been on Eliquis for A-fib which is on hold now - on BID protonix - GI consulted-suspecting and diverticular bleed-no plans for colonoscopy at this time - 2 U PRBC transfused 11/7 -2 more units PRBC ordered today - clear liquids   Active Problems:   Atrial fibrillation - rate controlled - Eliquis on hold  Hypokalemia -Replacing- magnesium normal at 1.6   HTN (hypertension) - cont Coreg and Norvasc  with  holding parameters   Sacral decubitus ulcers, stage III - per daughter, both have been healing well   Late effects of CVA  - essentially bedbound  Chronic foley -continue   Code Status: DO NOT RESUSCITATE Family Communication: daughter Disposition Plan: home when stable-on hospice at home DVT prophylaxis: SCDs  Consultants: GI  Procedures: none  Antibiotics: Anti-infectives    None      Objective: Filed Weights   08/23/14 0438 08/24/14 0404  Weight: 61 kg (134 lb 7.7 oz) 67.132 kg (148 lb)    Intake/Output Summary (Last 24 hours) at 08/24/14 1248 Last data filed at 08/23/14 2100  Gross per 24 hour  Intake    100 ml  Output    150 ml  Net    -50 ml     Vitals Filed Vitals:   08/24/14 0641 08/24/14 0912 08/24/14 1059 08/24/14 1140  BP:  108/57 83/54 118/39  Pulse: 95 56 54 56  Temp:  98.2 F (36.8 C) 98.2 F (36.8 C) 98.6 F (37 C)  TempSrc:   Oral Oral Oral  Resp:  18 18 18   Height:      Weight:      SpO2:  100% 100% 100%    Exam: General: No acute respiratory distress-awake alert oriented 3 Lungs: Clear to auscultation bilaterally without wheezes or crackles Cardiovascular: Regular rate and rhythm without murmur gallop or rub normal S1 and S2 Abdomen: Nontender, nondistended, soft, bowel sounds positive, no rebound, no ascites, no appreciable mass Extremities: No significant cyanosis, clubbing, or edema bilateral lower extremities Skin: 2 stage 3 decubitus ulcers on sacrum and right hip  Data Reviewed: Basic Metabolic Panel:  Recent Labs Lab 08/22/14 2258 08/24/14 0310 08/24/14 0730  NA 141 142  --   K 3.0* 3.2*  --   CL 103 106  --   CO2 22 20  --   GLUCOSE 119* 128*  --   BUN 23 30*  --   CREATININE 1.16 1.31  --   CALCIUM 8.2* 8.0*  --   MG  --   --  1.6   Liver Function Tests:  Recent Labs Lab 08/22/14 2258  AST 10  ALT <5  ALKPHOS 67  BILITOT 0.2*  PROT 5.9*  ALBUMIN 2.3*   No results for input(s): LIPASE, AMYLASE in the last 168 hours. No results for input(s): AMMONIA in the last 168 hours. CBC:  Recent Labs Lab 08/22/14 2258 08/23/14 1620 08/23/14 2300 08/24/14 0310  WBC 9.0  --   --   --  HGB 7.0* 8.2* 7.7* 7.1*  HCT 21.0* 23.7* 22.3* 20.9*  MCV 84.0  --   --   --   PLT 310  --   --   --    Cardiac Enzymes: No results for input(s): CKTOTAL, CKMB, CKMBINDEX, TROPONINI in the last 168 hours. BNP (last 3 results) No results for input(s): PROBNP in the last 8760 hours. CBG: No results for input(s): GLUCAP in the last 168 hours.  No results found for this or any previous visit (from the past 240 hour(s)).   Studies:  Recent x-ray studies have been reviewed in detail by the Attending Physician  Scheduled Meds:  Scheduled Meds: . sodium chloride   Intravenous Once  . amLODipine  10 mg Oral Daily  . atorvastatin  40 mg Oral QHS  . brinzolamide  1 drop Both Eyes BID  .  carvedilol  12.5 mg Oral BID WC  . mupirocin ointment  1 application Nasal BID  . pantoprazole (PROTONIX) IV  40 mg Intravenous Q12H  . potassium chloride  40 mEq Oral Q4H  . sodium chloride  3 mL Intravenous Q12H   Continuous Infusions: . sodium chloride 75 mL/hr at 08/23/14 0800    Time spent on care of this patient: 35 min   Gianne Shugars, MD 08/24/2014, 12:48 PM  LOS: 2 days   [facility] Office  [phone] Pager - Text Page per [email or web address]  If 7PM-7AM, please contact night-coverage [email or web address]

## 2014-08-24 NOTE — Progress Notes (Signed)
[facility] Progress Note  Subjective:   Rec 2 units blood last pm. Hgb this morning 7.1. Currently receiving 1 unit prbcs. Had a moderate sized dark red BM earlier this morning.Eliquis on hold.  Objective:  Vital signs in last 24 hours: Temp:  [97.8 F (36.6 C)-98.5 F (36.9 C)] 98.2 F (36.8 C) (11/08 0912) Pulse Rate:  [56-95] 56 (11/08 0912) Resp:  [16-18] 18 (11/08 0912) BP: (108-126)/(40-66) 108/57 mmHg (11/08 0912) SpO2:  [95 %-100 %] 100 % (11/08 0912) Weight:  [148 lb (67.132 kg)] 148 lb (67.132 kg) (11/08 0404) Last BM Date: 08/23/14 General:   Alert, elderly, contracted male  in NAD Heart:  Regular rate and rhythm; no murmurs Pulm;lungs clear to ausc Abdomen:  Soft, mild diffuse tenderness, nondistended. Normal bowel sounds, without guarding, and without rebound.   Extremities: LUE and LLE contracted Neurologic:  Alert and  oriented  Psych:  Alert and cooperati  Lab Results:  Recent Labs  08/22/14 2258 08/23/14 1620 08/23/14 2300 08/24/14 0310  WBC 9.0  --   --   --   HGB 7.0* 8.2* 7.7* 7.1*  HCT 21.0* 23.7* 22.3* 20.9*  PLT 310  --   --   --    BMET  Recent Labs  08/22/14 2258 08/24/14 0310  NA 141 142  K 3.0* 3.2*  CL 103 106  CO2 22 20  GLUCOSE 119* 128*  BUN 23 30*  CREATININE 1.16 1.31  CALCIUM 8.2* 8.0*   LFT  Recent Labs  08/22/14 2258  PROT 5.9*  ALBUMIN 2.3*  AST 10  ALT <5  ALKPHOS 67  BILITOT 0.2*   PT/INR  Recent Labs  08/22/14 2258 08/24/14 0310  LABPROT 20.7* 18.6*  INR 1.76* 1.54*     Ct Abdomen Pelvis W Contrast  08/23/2014   CLINICAL DATA:  Rectal bleeding.  Bloody stool.  EXAM: CT ABDOMEN AND PELVIS WITH CONTRAST  TECHNIQUE: Multidetector CT imaging of the abdomen and pelvis was performed using the standard protocol following bolus administration of intravenous contrast.  CONTRAST:  100mL OMNIPAQUE IOHEXOL 300 MG/ML  SOLN  COMPARISON:  CT 01/19/2014  FINDINGS: Lower chest: Lung bases are  relatively clear. There is mild ground-glass opacity in the left lower lobe suggesting atelectasis. No pericardial fluid.  Hepatobiliary: No focal hepatic lesion. The gallbladder is distended to 4 cm. No evidence of inflammation.  Pancreas: Pancreas is normal. No duct dilatation. No pancreatic inflammation.  Spleen: Normal spleen  Adrenals/urinary tract: Adrenal glands kidneys normal. No a ureteral abnormality. Foley catheter in the bladder.  Stomach/Bowel: Stomach, small bowel, and cecum are normal. Normal volume stool through the colon. There are diverticula of the descending colon and sigmoid colon without acute inflammation. Rectum is grossly normal.  Vascular/Lymphatic: Abdominal aorta is normal caliber. There is no retroperitoneal or periportal lymphadenopathy. No pelvic lymphadenopathy.  Reproductive: Prostate small.  Musculoskeletal: No aggressive osseous lesion. There is degenerative change at the left hip. The left femur is elevated.  Other: No free fluid or abscess.  IMPRESSION: 1. No explanation for rectal bleeding. 2. Left colon and sigmoid diverticulosis without evidence of acute diverticulitis. 3. Mild right basilar atelectasis.   Electronically Signed   By: Genevive BiStewart  Edmunds M.D.   On: 08/23/2014 01:05    ASSESSMENT/PLAN:  80 yo male with probable lower GI bleed. Was on eliquis, which has been held. Had CT scan that showed diverticulosis, and bleeding may be due to this. Will review with Dr Rhea BeltonPyrtle.  LOS: 2 days   Daniah Zaldivar, Tollie PizzaLori P PA-C 08/24/2014, Pager [phone]

## 2014-08-25 DIAGNOSIS — K922 Gastrointestinal hemorrhage, unspecified: Secondary | ICD-10-CM

## 2014-08-25 DIAGNOSIS — E119 Type 2 diabetes mellitus without complications: Secondary | ICD-10-CM

## 2014-08-25 DIAGNOSIS — K921 Melena: Secondary | ICD-10-CM

## 2014-08-25 DIAGNOSIS — L89153 Pressure ulcer of sacral region, stage 3: Secondary | ICD-10-CM

## 2014-08-25 DIAGNOSIS — L89159 Pressure ulcer of sacral region, unspecified stage: Secondary | ICD-10-CM | POA: Insufficient documentation

## 2014-08-25 LAB — CBC
HCT: 23 % — ABNORMAL LOW (ref 39.0–52.0)
Hemoglobin: 7.9 g/dL — ABNORMAL LOW (ref 13.0–17.0)
MCH: 29 pg (ref 26.0–34.0)
MCHC: 34.3 g/dL (ref 30.0–36.0)
MCV: 84.6 fL (ref 78.0–100.0)
PLATELETS: 173 10*3/uL (ref 150–400)
RBC: 2.72 MIL/uL — ABNORMAL LOW (ref 4.22–5.81)
RDW: 15.5 % (ref 11.5–15.5)
WBC: 8.8 10*3/uL (ref 4.0–10.5)

## 2014-08-25 LAB — BASIC METABOLIC PANEL
Anion gap: 11 (ref 5–15)
BUN: 22 mg/dL (ref 6–23)
CO2: 18 mEq/L — ABNORMAL LOW (ref 19–32)
CREATININE: 0.77 mg/dL (ref 0.50–1.35)
Calcium: 6.5 mg/dL — ABNORMAL LOW (ref 8.4–10.5)
Chloride: 119 mEq/L — ABNORMAL HIGH (ref 96–112)
GFR calc Af Amer: >90 mL/min (ref >90)
GFR, EST NON AFRICAN AMERICAN: 81 mL/min — AB (ref >90)
Glucose, Bld: 84 mg/dL (ref 70–99)
Potassium: 3.2 mEq/L — ABNORMAL LOW (ref 3.7–5.3)
SODIUM: 148 meq/L — AB (ref 137–147)

## 2014-08-25 LAB — PREPARE RBC (CROSSMATCH)

## 2014-08-25 MED ORDER — SODIUM CHLORIDE 0.45 % IV SOLN
INTRAVENOUS | Status: DC
  Administered 2014-08-26 – 2014-09-01 (×11): via INTRAVENOUS

## 2014-08-25 MED ORDER — POTASSIUM CHLORIDE CRYS ER 20 MEQ PO TBCR
40.0000 meq | EXTENDED_RELEASE_TABLET | ORAL | Status: AC
  Administered 2014-08-25 (×2): 40 meq via ORAL
  Filled 2014-08-25 (×2): qty 2

## 2014-08-25 MED ORDER — POTASSIUM CHLORIDE CRYS ER 20 MEQ PO TBCR: 40.0000 meq | EXTENDED_RELEASE_TABLET | ORAL | Status: DC | PRN

## 2014-08-25 MED ORDER — PRO-STAT SUGAR FREE PO LIQD
30.0000 mL | Freq: Three times a day (TID) | ORAL | Status: DC
  Administered 2014-08-25 – 2014-08-29 (×4): 30 mL via ORAL
  Filled 2014-08-25 (×15): qty 30

## 2014-08-25 MED ORDER — SODIUM CHLORIDE 0.45 % IV SOLN: INTRAVENOUS | Status: DC

## 2014-08-25 MED ORDER — SODIUM CHLORIDE 0.9 % IV SOLN: Freq: Once | INTRAVENOUS | Status: DC

## 2014-08-25 NOTE — Progress Notes (Signed)
Pt.is A/Ox1 and is not ambulatory. He is 2 person maximum assist. He received blood during the shift with no sign of adverse reaction. Hospice came today to follow up with patient. Pt.has chronic foley in place. He continues to have frequent bowel incontinence with dark tarry stool. Pt.received prn pain medication for c/o pain.  

## 2014-08-25 NOTE — Progress Notes (Signed)
Inpatient [facility] Rm 3E 20 [facility]  [facility]; pt is a DNR code status  [facility] episodes of bloody stools at home [facility] was notified this morning by pt's daughter that he was admitted Friday. Pt seen at bedside awake, follows simple commands, speech is garbled, unclear how much pt is comprehending, per notes he has been intermittently confused in the past; L hemiparesis from previous stroke- L leg contracted L arm minimal movement, per 3E aide pt was assisted with breakfast and able to take in 25-50%; no family at bedside.  Writer present and assisted briefly while [facility] and staff aide cleaned pt after large loose dark burgandy stool with some clots; pt moaning with repositioning & cleansing, dressing changes. Pt had received Oxy IR 5 mg at 8:43 am and can have this q 4 hr prn for pain- discussed with staff RN Leonia ReevesMandesia pt may require additional dose after dressing as it is just at the 4 hour time frame and pt was in discomfort during the personal care; she will follow-up to assess comfort. Staff RN informed pt has order to transfuse 2 U PRBC today; Hgb =7.9 [facility] home medication list, transfer summary and GOLD OOF DNR form on shadow chart [facility] team will continue to follow during hospital stay; above discussed with [facility] SW.  Please contact [phone] with any hospice needs/ questions. Thank you Valente DavidMargie Kwane Rohl, RN MSN [facility] Hospital Liaison [phone]

## 2014-08-25 NOTE — Progress Notes (Signed)
INITIAL NUTRITION ASSESSMENT  DOCUMENTATION CODES Per approved criteria  -Severe malnutrition in the context of chronic illness   Pt meets criteria for severe MALNUTRITION in the context of chronic illness as evidenced by 17.5% wt loss x 6 months, <75% estimated energy intake x 1 month.  INTERVENTION: 30 ml Prostat TID  NUTRITION DIAGNOSIS: Increased nutrient needs related to wound healing as evidenced by multiple pressure ulcers, estimated nutrition needs.   Goal: Pt will meet nutrition needs as able given hospice stauts  Monitor:  Diet advancement, PO/supplement intake, labs, weight changes, I/O's  Reason for Assessment: Low Braden  80 y.o. male  Admitting Dx: GI bleed  Joel [name] is a 80 y.o. male with history of CVA with left-sided hemiparesis, atrial fibrillation on Eliquis,hypertension and hyperlipidemia and severe decubitus ulcers who presents to the hospital with bloody stools and decreased responsiveness.  ASSESSMENT: Pt admitted with lower GI bleed.  Pt has been followed by hospice at home following a stroke. He was somnolent at time of visit and unable to participate in interview or exam.  Per staff, pt requires feeding assistance. Noted 25-50% completion of clear liquid breakfast tray this morning.  Per MD, pt will not undergo colonoscopy and will continue noninvasive treatment.  Expect nutritional decline due to hospice status and multiple pressure ulcers. Pt has experienced a 17.5% wt loss x 6 months,  Labs reviewed. Na: 148, K: 3.2, Cl: 119, CO2: 18, Calcium: 6.5. Mg WDL.   Height: Ht Readings from Last 1 Encounters:  08/23/14 5\' 9"  (1.753 m)    Weight: Wt Readings from Last 1 Encounters:  08/25/14 141 lb (63.957 kg)    Ideal Body Weight: 160#  % Ideal Body Weight: 88%  Wt Readings from Last 10 Encounters:  08/25/14 141 lb (63.957 kg)  02/23/14 171 lb 1.2 oz (77.6 kg)  01/20/14 178 lb 1.6 oz (80.786 kg)  01/14/14 183 lb 3.2 oz (83.099 kg)     Usual Body Weight: 183#  % Usual Body Weight: 77%  BMI:  Body mass index is 20.81 kg/(m^2). Normal weight range  Estimated Nutritional Needs: Kcal: 1900-2100 Protein: 83-93 grams Fluid: 1.9-2.1 L  Skin: stage III pressure ulcers on sacrum, rt hip, lt heel; stage II pressure ulcer on rt heel; stage IV pressure ulcer on rt hip  Diet Order:  clear liquid  EDUCATION NEEDS: -Education not appropriate at this time   Intake/Output Summary (Last 24 hours) at 08/25/14 0853 Last data filed at 08/25/14 0807  Gross per 24 hour  Intake   2245 ml  Output   1025 ml  Net   1220 ml    Last BM: 08/24/14  Labs:   Recent Labs Lab 08/22/14 2258 08/24/14 0310 08/24/14 0730 08/25/14 0602  NA 141 142  --  148*  K 3.0* 3.2*  --  3.2*  CL 103 106  --  119*  CO2 22 20  --  18*  BUN 23 30*  --  22  CREATININE 1.16 1.31  --  0.77  CALCIUM 8.2* 8.0*  --  6.5*  MG  --   --  1.6  --   GLUCOSE 119* 128*  --  84    CBG (last 3)  No results for input(s): GLUCAP in the last 72 hours.  Scheduled Meds: . sodium chloride   Intravenous Once  . sodium chloride   Intravenous Once  . amLODipine  10 mg Oral Daily  . atorvastatin  40 mg Oral QHS  .  brinzolamide  1 drop Both Eyes BID  . carvedilol  12.5 mg Oral BID WC  . mupirocin ointment  1 application Nasal BID  . pantoprazole (PROTONIX) IV  40 mg Intravenous Q12H  . sodium chloride  3 mL Intravenous Q12H    Continuous Infusions: . sodium chloride 100 mL/hr at 08/24/14 1554    Past Medical History  Diagnosis Date  . A-fib   . Hypertension   . Rib fracture 12/29/2013    "fell in his home; due to the stroke"  . CVA (cerebral infarction)   . Encephalopathy   . Hypercholesterolemia   . Pneumonia 12/2013  . Stroke 12/29/2013    "weaker on left side since; vision, bladder, vision problems since" (02/19/2014)  . Arthritis     "knees; fingers" (02/19/2014)  . Foley catheter in place     "came into hospital w/it" (02/19/2014)    Past  Surgical History  Procedure Laterality Date  . No past surgeries      [name], RD, LDN Pager: [phone] After hours Pager: [phone]

## 2014-08-25 NOTE — Care Management Note (Addendum)
  Page 1 of 1   09/01/2014     4:26:50 PM CARE MANAGEMENT NOTE 09/01/2014  Patient:  Angie FavaMORGAN,Nikoli A   Account Number:  [id number]  Date Initiated:  08/25/2014  Documentation initiated by:  Chandani Rogowski  Subjective/Objective Assessment:   AMS, HBG 7.0     Action/Plan:   CM to follow for dispostion needs   Anticipated DC Date:  08/26/2014   [facility]  In-house referral  Clinical Social Worker      DC Planning Services  CM consult      [facility]  [facility]   Choice offered to / List presented to:  C-4 Adult Children           HH agency  [facility] [place]   Status of service:  Completed, signed off Medicare Important Message given?  YES (If response is "NO", the following Medicare IM given date fields will be blank) Date Medicare IM given:  08/25/2014 Medicare IM given by:  Ashwath Lasch Date Additional Medicare IM given:  09/01/2014 Additional Medicare IM given by:  Janaki Exley  [facility]  Per UR Regulation:  Reviewed for med. necessity/level of care/duration of stay  If discussed at Long Length of Stay Meetings, dates discussed:   2014-09-06    Comments:  Donato Schultzrystal Ersilia Brawley RN, BSN, MSHL, CCM  Nurse - Case Manager,  ([facility])  [phone]  09/01/2014 Dispo:  [facility].

## 2014-08-25 NOTE — Progress Notes (Signed)
[facility] Progress Note   Joel Nunneryeter A Quilling ONG:[id number] DOB: 03/19/1928 DOA: 08/22/2014 PCP: Georgann HousekeeperHUSAIN,KARRAR, MD  Brief narrative: Joel [name] is a 80 y.o. male with history of CVA with left-sided hemiparesis, atrial fibrillation on Eliquis,hypertension and hyperlipidemia and severe decubitus ulcers who presents to the hospital with bloody stools and decreased responsiveness.   Subjective: Continues to have some bleeding. Has no other complaints. Drinking fluids well.   Assessment/Plan: Principal Problem:  GI bleed- anemia due to acute blood loss- Diverticulosis noted on CT  - has been on Eliquis for A-fib which is on hold now - on BID protonix - GI consulted-suspecting and diverticular bleed-no plans for colonoscopy at this time - 2 U PRBC transfused 11/7 -2 more units PRBC ordered today - continue clear liquids   Active Problems:   Atrial fibrillation - rate controlled - Eliquis on hold  Hypokalemia -Replacing- magnesium normal at 1.6   HTN (hypertension) - cont Coreg and Norvasc  with  holding parameters   Sacral decubitus ulcers, stage III - per daughter, both have been healing well   Late effects of CVA  - essentially bedbound  Chronic foley -continue   Code Status: DO NOT RESUSCITATE Family Communication: daughter Disposition Plan: home when stable-on hospice at home DVT prophylaxis: SCDs  Consultants: GI  Procedures: none  Antibiotics: Anti-infectives    None      Objective: Filed Weights   08/23/14 0438 08/24/14 0404 08/25/14 0808  Weight: 61 kg (134 lb 7.7 oz) 67.132 kg (148 lb) 63.957 kg (141 lb)    Intake/Output Summary (Last 24 hours) at 08/25/14 1325 Last data filed at 08/25/14 0936  Gross per 24 hour  Intake   2565 ml  Output   1025 ml  Net   1540 ml     Vitals Filed Vitals:   08/25/14 0245 08/25/14 0808 08/25/14 0835 08/25/14 1313  BP: 109/33  139/68 138/70  Pulse: 70  70 83  Temp: 97.7 F (36.5 C)   97.7  F (36.5 C)  TempSrc: Oral   Oral  Resp: 16  16 16   Height:      Weight:  63.957 kg (141 lb)    SpO2: 100%  100% 100%    Exam: General: No acute respiratory distress-awake alert oriented 3 Lungs: Clear to auscultation bilaterally without wheezes or crackles Cardiovascular: Regular rate and rhythm without murmur gallop or rub normal S1 and S2 Abdomen: Nontender, nondistended, soft, bowel sounds positive, no rebound, no ascites, no appreciable mass Extremities: No significant cyanosis, clubbing, or edema bilateral lower extremities Skin: 2 stage 3 decubitus ulcers on sacrum and right hip  Data Reviewed: Basic Metabolic Panel:  Recent Labs Lab 08/22/14 2258 08/24/14 0310 08/24/14 0730 08/25/14 0602  NA 141 142  --  148*  K 3.0* 3.2*  --  3.2*  CL 103 106  --  119*  CO2 22 20  --  18*  GLUCOSE 119* 128*  --  84  BUN 23 30*  --  22  CREATININE 1.16 1.31  --  0.77  CALCIUM 8.2* 8.0*  --  6.5*  MG  --   --  1.6  --    Liver Function Tests:  Recent Labs Lab 08/22/14 2258  AST 10  ALT <5  ALKPHOS 67  BILITOT 0.2*  PROT 5.9*  ALBUMIN 2.3*   No results for input(s): LIPASE, AMYLASE in the last 168 hours. No results for input(s): AMMONIA in the last 168 hours. CBC:  Recent Labs  Lab 08/22/14 2258 08/23/14 1620 08/23/14 2300 08/24/14 0310 08/24/14 1925 08/25/14 0602  WBC 9.0  --   --   --   --  8.8  HGB 7.0* 8.2* 7.7* 7.1* 6.7* 7.9*  HCT 21.0* 23.7* 22.3* 20.9* 19.4* 23.0*  MCV 84.0  --   --   --   --  84.6  PLT 310  --   --   --   --  173   Cardiac Enzymes: No results for input(s): CKTOTAL, CKMB, CKMBINDEX, TROPONINI in the last 168 hours. BNP (last 3 results) No results for input(s): PROBNP in the last 8760 hours. CBG: No results for input(s): GLUCAP in the last 168 hours.  Recent Results (from the past 240 hour(s))  Urine culture     Status: None (Preliminary result)   Collection Time: 08/23/14 12:02 AM  Result Value Ref Range Status   Specimen  Description URINE, RANDOM  Final   Special Requests NONE  Final   Culture  Setup Time   Final    08/23/2014 10:49 Performed at [facility]    Colony Count   Final    >=100,000 COLONIES/ML Performed at [facility]    Culture   Final    GRAM NEGATIVE RODS Performed at [facility]    Report Status PENDING  Incomplete     Studies:  Recent x-ray studies have been reviewed in detail by the Attending Physician  Scheduled Meds:  Scheduled Meds: . sodium chloride   Intravenous Once  . sodium chloride   Intravenous Once  . sodium chloride   Intravenous Once  . amLODipine  10 mg Oral Daily  . atorvastatin  40 mg Oral QHS  . brinzolamide  1 drop Both Eyes BID  . carvedilol  12.5 mg Oral BID WC  . feeding supplement (PRO-STAT SUGAR FREE 64)  30 mL Oral TID WC  . mupirocin ointment  1 application Nasal BID  . pantoprazole (PROTONIX) IV  40 mg Intravenous Q12H  . potassium chloride  40 mEq Oral Q4H  . sodium chloride  3 mL Intravenous Q12H   Continuous Infusions: . sodium chloride Stopped (08/25/14 0930)    Time spent on care of this patient: 35 min   Cherice Glennie, MD 08/25/2014, 1:25 PM  LOS: 3 days   [facility] Office  [phone] Pager - Text Page per [email or web address]  If 7PM-7AM, please contact night-coverage [email or web address]

## 2014-08-25 NOTE — Progress Notes (Signed)
UR completed (Retro Initial) Cardelia Sassano K. Mianna Iezzi, RN, BSN, MSHL, CCM  08/25/2014 3:19 PM

## 2014-08-25 NOTE — Clinical Social Work Note (Signed)
[facility] SW visited pt in his hospital room. Pt's speech was garbled and it was difficult to assess his mental status. He was able to say "back" when SW asked where he was hurting. SW notified hospital RN and she agreed to give him something. SW provided emotional support to pt, who seemed somewhat frustrated at not being understood. SW asked if pt wanted to watch TV, and he said yes - SW turned on the TV for pt. SW called pt's daughter/PCG, Dois DavenportSandra and left message requesting a call back.    Please call SW Erie Noe(Vanessa Royse) at [phone] with questions.   Jilda PandaVanessa Royse, LCSW

## 2014-08-25 NOTE — Plan of Care (Signed)
Problem: Phase I Progression Outcomes Goal: Pain controlled with appropriate interventions Outcome: Completed/Met Date Met:  08/25/14     

## 2014-08-25 NOTE — Progress Notes (Addendum)
[facility] Progress Note    Since last GI note: Eating jello, broth with assistance.  Still with overt rectal bleeding.    Objective: Vital signs in last 24 hours: Temp:  [97.7 F (36.5 C)-99.4 F (37.4 C)] 97.7 F (36.5 C) (11/09 0245) Pulse Rate:  [54-77] 70 (11/09 0835) Resp:  [16-18] 16 (11/09 0245) BP: (83-118)/(30-57) 109/33 mmHg (11/09 0245) SpO2:  [98 %-100 %] 100 % (11/09 0835) Weight:  [141 lb (63.957 kg)] 141 lb (63.957 kg) (11/09 0808) Last BM Date: 08/24/14 General: alert and oriented times 3 Heart: regular rate and rythm Abdomen: soft, non-tender, non-distended, normal bowel sounds   Lab Results:  Recent Labs  08/22/14 2258  08/24/14 0310 08/24/14 1925 08/25/14 0602  WBC 9.0  --   --   --  8.8  HGB 7.0*  < > 7.1* 6.7* 7.9*  PLT 310  --   --   --  173  MCV 84.0  --   --   --  84.6  < > = values in this interval not displayed.  Recent Labs  08/22/14 2258 08/24/14 0310 08/25/14 0602  NA 141 142 148*  K 3.0* 3.2* 3.2*  CL 103 106 119*  CO2 22 20 18*  GLUCOSE 119* 128* 84  BUN 23 30* 22  CREATININE 1.16 1.31 0.77  CALCIUM 8.2* 8.0* 6.5*    Recent Labs  08/22/14 2258  PROT 5.9*  ALBUMIN 2.3*  AST 10  ALT <5  ALKPHOS 67  BILITOT 0.2*    Recent Labs  08/22/14 2258 08/24/14 0310  INR 1.76* 1.54*   Medications: Scheduled Meds: . sodium chloride   Intravenous Once  . sodium chloride   Intravenous Once  . amLODipine  10 mg Oral Daily  . atorvastatin  40 mg Oral QHS  . brinzolamide  1 drop Both Eyes BID  . carvedilol  12.5 mg Oral BID WC  . mupirocin ointment  1 application Nasal BID  . pantoprazole (PROTONIX) IV  40 mg Intravenous Q12H  . sodium chloride  3 mL Intravenous Q12H   Continuous Infusions: . sodium chloride 100 mL/hr at 08/24/14 1554   PRN Meds:.acetaminophen **OR** acetaminophen, alum & mag hydroxide-simeth, HYDROmorphone (DILAUDID) injection, LORazepam, ondansetron **OR** ondansetron (ZOFRAN) IV,  oxyCODONE    Assessment/Plan: 80 y.o. male with GI bleeding, on eliquis, mutliple comorbid conditions  Given his overall status (on hospice, unable to feed self, pressure sores, s/p CVA) I think colonoscopy could be quite difficult for him and probably.  I favor simply giving him more time for the eliquis to wash out and serious consideration should be made to not restart it ever.  Will follow along.  Since no plans for invasive testing, will decrease IV fluids and advance diet as tolerated.  CBC in AM.  Rachael FeeJacobs, Necole Minassian P, MD  08/25/2014, 8:41 AM [facility] Pager [phone]

## 2014-08-26 DIAGNOSIS — E43 Unspecified severe protein-calorie malnutrition: Secondary | ICD-10-CM | POA: Insufficient documentation

## 2014-08-26 DIAGNOSIS — I481 Persistent atrial fibrillation: Secondary | ICD-10-CM

## 2014-08-26 LAB — CBC WITH DIFFERENTIAL/PLATELET
Basophils Absolute: 0 10*3/uL (ref 0.0–0.1)
Basophils Relative: 0 % (ref 0–1)
EOS ABS: 0.1 10*3/uL (ref 0.0–0.7)
Eosinophils Relative: 1 % (ref 0–5)
HCT: 26.9 % — ABNORMAL LOW (ref 39.0–52.0)
Hemoglobin: 9.1 g/dL — ABNORMAL LOW (ref 13.0–17.0)
LYMPHS ABS: 1.9 10*3/uL (ref 0.7–4.0)
Lymphocytes Relative: 18 % (ref 12–46)
MCH: 29.4 pg (ref 26.0–34.0)
MCHC: 33.8 g/dL (ref 30.0–36.0)
MCV: 87.1 fL (ref 78.0–100.0)
MONO ABS: 0.9 10*3/uL (ref 0.1–1.0)
MONOS PCT: 8 % (ref 3–12)
NEUTROS PCT: 73 % (ref 43–77)
Neutro Abs: 7.7 10*3/uL (ref 1.7–7.7)
Platelets: 184 10*3/uL (ref 150–400)
RBC: 3.09 MIL/uL — ABNORMAL LOW (ref 4.22–5.81)
RDW: 15.5 % (ref 11.5–15.5)
WBC: 10.5 10*3/uL (ref 4.0–10.5)

## 2014-08-26 LAB — TYPE AND SCREEN
ABO/RH(D): A POS
ANTIBODY SCREEN: NEGATIVE
UNIT DIVISION: 0
Unit division: 0
Unit division: 0
Unit division: 0
Unit division: 0
Unit division: 0

## 2014-08-26 LAB — URINE CULTURE

## 2014-08-26 LAB — CBC
HCT: 27.7 % — ABNORMAL LOW (ref 39.0–52.0)
Hemoglobin: 9.4 g/dL — ABNORMAL LOW (ref 13.0–17.0)
MCH: 28.9 pg (ref 26.0–34.0)
MCHC: 33.9 g/dL (ref 30.0–36.0)
MCV: 85.2 fL (ref 78.0–100.0)
Platelets: 193 10*3/uL (ref 150–400)
RBC: 3.25 MIL/uL — ABNORMAL LOW (ref 4.22–5.81)
RDW: 15.4 % (ref 11.5–15.5)
WBC: 12 10*3/uL — ABNORMAL HIGH (ref 4.0–10.5)

## 2014-08-26 LAB — BASIC METABOLIC PANEL
ANION GAP: 10 (ref 5–15)
BUN: 20 mg/dL (ref 6–23)
CO2: 20 mEq/L (ref 19–32)
CREATININE: 0.81 mg/dL (ref 0.50–1.35)
Calcium: 8.3 mg/dL — ABNORMAL LOW (ref 8.4–10.5)
Chloride: 116 mEq/L — ABNORMAL HIGH (ref 96–112)
GFR calc non Af Amer: 79 mL/min — ABNORMAL LOW (ref >90)
Glucose, Bld: 117 mg/dL — ABNORMAL HIGH (ref 70–99)
Potassium: 4.6 mEq/L (ref 3.7–5.3)
Sodium: 146 mEq/L (ref 137–147)

## 2014-08-26 LAB — GLUCOSE, CAPILLARY: Glucose-Capillary: 220 mg/dL — ABNORMAL HIGH (ref 70–99)

## 2014-08-26 NOTE — Progress Notes (Signed)
Daily Rounding Note  08/26/2014, 9:30 AM  LOS: 4 days   SUBJECTIVE:       Red, bloody stools reported by RN.  Last at 0800 today.    OBJECTIVE:         Vital signs in last 24 hours:    Temp:  [97.1 F (36.2 C)-98.7 F (37.1 C)] 97.9 F (36.6 C) (11/10 0511) Pulse Rate:  [66-86] 80 (11/10 0511) Resp:  [16-18] 18 (11/10 0511) BP: (120-138)/(46-82) 126/66 mmHg (11/10 0511) SpO2:  [96 %-100 %] 100 % (11/10 0511) Weight:  [131 lb 3.2 oz (59.512 kg)] 131 lb 3.2 oz (59.512 kg) (11/10 0511) Last BM Date: 08/26/14 Filed Weights   08/24/14 0404 08/25/14 0808 08/26/14 0511  Weight: 148 lb (67.132 kg) 141 lb (63.957 kg) 131 lb 3.2 oz (59.512 kg)   General: contracted near fetal position in bed.  Resting quietly and did not arouse to name or to gentle exam   Heart: RRR Chest: clear bil.  No labored breathing Abdomen: soft, NT, ND.  Bs hypoactive  Extremities: no CCE.  + UE and LE contracturing Rectal:  Dried balck/red bloody stool  Staining on bed pad.  Neuro/Psych:  Unresponsive to exam.  Did not attempt noxious stimuli to arouse.   Intake/Output from previous day: 11/09 0701 - 11/10 0700 In: 1952 [P.O.:320; I.V.:1100; Blood:532] Out: 1075 [Urine:1075]  Intake/Output this shift:    Lab Results:  Recent Labs  08/24/14 1925 08/25/14 0602 08/26/14 0735  WBC  --  8.8 12.0*  HGB 6.7* 7.9* 9.4*  HCT 19.4* 23.0* 27.7*  PLT  --  173 193   BMET  Recent Labs  08/24/14 0310 08/25/14 0602 08/26/14 0735  NA 142 148* 146  K 3.2* 3.2* 4.6  CL 106 119* 116*  CO2 20 18* 20  GLUCOSE 128* 84 117*  BUN 30* 22 20  CREATININE 1.31 0.77 0.81  CALCIUM 8.0* 6.5* 8.3*   LFT No results for input(s): PROT, ALBUMIN, AST, ALT, ALKPHOS, BILITOT, BILIDIR, IBILI in the last 72 hours. PT/INR  Recent Labs  08/24/14 0310  LABPROT 18.6*  INR 1.54*   Hepatitis Panel No results for input(s): HEPBSAG, HCVAB, HEPAIGM, HEPBIGM in  the last 72 hours.  Studies/Results: No results found.  Scheduled Meds: . sodium chloride   Intravenous Once  . sodium chloride   Intravenous Once  . sodium chloride   Intravenous Once  . amLODipine  10 mg Oral Daily  . atorvastatin  40 mg Oral QHS  . brinzolamide  1 drop Both Eyes BID  . carvedilol  12.5 mg Oral BID WC  . feeding supplement (PRO-STAT SUGAR FREE 64)  30 mL Oral TID WC  . mupirocin ointment  1 application Nasal BID  . pantoprazole (PROTONIX) IV  40 mg Intravenous Q12H  . sodium chloride  3 mL Intravenous Q12H   Continuous Infusions: . sodium chloride 75 mL/hr at 08/26/14 0018   PRN Meds:.acetaminophen **OR** acetaminophen, alum & mag hydroxide-simeth, HYDROmorphone (DILAUDID) injection, LORazepam, ondansetron **OR** ondansetron (ZOFRAN) IV, oxyCODONE   ASSESMENT:   *  GI bleed:  Abdominal pain and bloody stools Constipation and fecal impaction requiring disimpaction 2 weeks PTA.   CT scan confirms diverticulosis.   *  ABL anemia.  S/p 6 PRBCs so far. Hgb improved.   *  S/p CVA, in hospice care PTA. On Eliquis PTA, now on hold.   PLAN   *  Continue to hold Eliquis,  and would never restart.     Jennye MoccasinSarah Gribbin  08/26/2014, 9:30 AM Pager: [phone]  ________________________________________________________________________  [facility] MD note:  I personally examined the patient, reviewed the data and agree with the assessment and plan described above.  Continue to observe off eliquis, should consider not restarting it given his gi bleeding now as well has his multiple other co-morbid conditions.  Still presume diverticular bleed.   Rob Buntinganiel Season Astacio, MD [facility] Pager [phone]

## 2014-08-26 NOTE — Progress Notes (Addendum)
[facility] Progress Note   Joel Nunneryeter A Lopes ZOX:[id number] DOB: 03/24/1928 DOA: 08/22/2014 PCP: Georgann HousekeeperHUSAIN,KARRAR, MD  Brief narrative: Joel [name] is a 80 y.o. male with history of CVA with left-sided hemiparesis, atrial fibrillation on Eliquis,hypertension and hyperlipidemia and severe decubitus ulcers who presents to the hospital with bloody stools and decreased responsiveness. He has been transfused 4 U of  PRBC. He has had no abdominal pain. GI following- suspecting Diverticular bleed and at this time awaiting bleeding to resolve. No plans for colonoscopy.    Subjective: Continues to have some bleeding per RN- patient has no complaints.   Assessment/Plan: Principal Problem:  GI bleed- anemia due to acute blood loss- Diverticulosis noted on CT  - has been on Eliquis for A-fib which is on hold now - on BID protonix - GI consulted-suspecting a diverticular bleed-no plans for colonoscopy at this time - 2 U PRBC transfused 11/7 -2 more units PRBC ordered 11/8 - continue clear liquids per GI recommendations- apparently had some more bloody stools last night but interestingly Hgb did not drop- will recheck Hgb and remove red jello and red juices from trays (both present on tray today)   Active Problems:   Atrial fibrillation - rate controlled - Eliquis on hold  Hypokalemia -Replaced  Hypernatremia - due to IVF- have changed to 1/2 NS with improvement in sodium- follow   HTN (hypertension) - cont Coreg and Norvasc  with  holding parameters   Sacral decubitus ulcers, stage III - per daughter, both have been healing well   Late effects of CVA  - essentially bedbound  Chronic foley -continue   Code Status: DO NOT RESUSCITATE Family Communication: daughter Disposition Plan: home when stable-on hospice at home DVT prophylaxis: SCDs  Consultants: GI  Procedures: none  Antibiotics: Anti-infectives    None      Objective: Filed Weights   08/24/14  0404 08/25/14 0808 08/26/14 0511  Weight: 67.132 kg (148 lb) 63.957 kg (141 lb) 59.512 kg (131 lb 3.2 oz)    Intake/Output Summary (Last 24 hours) at 08/26/14 1209 Last data filed at 08/26/14 0658  Gross per 24 hour  Intake   1132 ml  Output    600 ml  Net    532 ml     Vitals Filed Vitals:   08/25/14 2254 08/25/14 2354 08/26/14 0015 08/26/14 0511  BP: 138/82 134/74 124/58 126/66  Pulse: 66 74 74 80  Temp: 98.1 F (36.7 C) 98.4 F (36.9 C) 98.7 F (37.1 C) 97.9 F (36.6 C)  TempSrc: Oral Oral Oral Oral  Resp: 18 16 18 18   Height:      Weight:    59.512 kg (131 lb 3.2 oz)  SpO2:    100%    Exam: General: No acute respiratory distress-awake alert oriented 3 Lungs: Clear to auscultation bilaterally without wheezes or crackles Cardiovascular: Regular rate and rhythm without murmur gallop or rub normal S1 and S2 Abdomen: Nontender, nondistended, soft, bowel sounds positive, no rebound, no ascites, no appreciable mass Extremities: No significant cyanosis, clubbing, or edema bilateral lower extremities Skin: 2 stage 3 decubitus ulcers on sacrum and right hip  Data Reviewed: Basic Metabolic Panel:  Recent Labs Lab 08/22/14 2258 08/24/14 0310 08/24/14 0730 08/25/14 0602 08/26/14 0735  NA 141 142  --  148* 146  K 3.0* 3.2*  --  3.2* 4.6  CL 103 106  --  119* 116*  CO2 22 20  --  18* 20  GLUCOSE 119* 128*  --  84 117*  BUN 23 30*  --  22 20  CREATININE 1.16 1.31  --  0.77 0.81  CALCIUM 8.2* 8.0*  --  6.5* 8.3*  MG  --   --  1.6  --   --    Liver Function Tests:  Recent Labs Lab 08/22/14 2258  AST 10  ALT <5  ALKPHOS 67  BILITOT 0.2*  PROT 5.9*  ALBUMIN 2.3*   No results for input(s): LIPASE, AMYLASE in the last 168 hours. No results for input(s): AMMONIA in the last 168 hours. CBC:  Recent Labs Lab 08/22/14 2258  08/23/14 2300 08/24/14 0310 08/24/14 1925 08/25/14 0602 08/26/14 0735  WBC 9.0  --   --   --   --  8.8 12.0*  HGB 7.0*  < > 7.7* 7.1*  6.7* 7.9* 9.4*  HCT 21.0*  < > 22.3* 20.9* 19.4* 23.0* 27.7*  MCV 84.0  --   --   --   --  84.6 85.2  PLT 310  --   --   --   --  173 193  < > = values in this interval not displayed. Cardiac Enzymes: No results for input(s): CKTOTAL, CKMB, CKMBINDEX, TROPONINI in the last 168 hours. BNP (last 3 results) No results for input(s): PROBNP in the last 8760 hours. CBG:  Recent Labs Lab 08/26/14 0721  GLUCAP 220*    Recent Results (from the past 240 hour(s))  Urine culture     Status: None   Collection Time: 08/23/14 12:02 AM  Result Value Ref Range Status   Specimen Description URINE, RANDOM  Final   Special Requests NONE  Final   Culture  Setup Time   Final    08/23/2014 10:49 Performed at [facility] Count   Final    >=100,000 COLONIES/ML Performed at [facility]    Culture   Final    ESCHERICHIA COLI Performed at [facility]    Report Status 08/26/2014 FINAL  Final   Organism ID, Bacteria ESCHERICHIA COLI  Final      Susceptibility   Escherichia coli - MIC*    AMPICILLIN >=32 RESISTANT Resistant     CEFAZOLIN >=64 RESISTANT Resistant     CEFTRIAXONE <=1 SENSITIVE Sensitive     CIPROFLOXACIN >=4 RESISTANT Resistant     GENTAMICIN >=16 RESISTANT Resistant     LEVOFLOXACIN >=8 RESISTANT Resistant     NITROFURANTOIN <=16 SENSITIVE Sensitive     TOBRAMYCIN 8 INTERMEDIATE Intermediate     TRIMETH/SULFA <=20 SENSITIVE Sensitive     PIP/TAZO >=128 RESISTANT Resistant     * ESCHERICHIA COLI     Studies:  Recent x-ray studies have been reviewed in detail by the Attending Physician  Scheduled Meds:  Scheduled Meds: . sodium chloride   Intravenous Once  . sodium chloride   Intravenous Once  . sodium chloride   Intravenous Once  . amLODipine  10 mg Oral Daily  . atorvastatin  40 mg Oral QHS  . brinzolamide  1 drop Both Eyes BID  . carvedilol  12.5 mg Oral BID WC  . feeding supplement (PRO-STAT SUGAR FREE 64)  30 mL Oral TID WC   . mupirocin ointment  1 application Nasal BID  . pantoprazole (PROTONIX) IV  40 mg Intravenous Q12H  . sodium chloride  3 mL Intravenous Q12H   Continuous Infusions: . sodium chloride 75 mL/hr at 08/26/14 0018    Time spent on care of this patient:  35 min   Kawika Bischoff, MD 08/26/2014, 12:09 PM  LOS: 4 days   [facility] Office  [phone] Pager - Text Page per [email or web address]  If 7PM-7AM, please contact night-coverage [email or web address]

## 2014-08-26 NOTE — Plan of Care (Signed)
Problem: Phase I Progression Outcomes Goal: Other Phase I Outcomes/Goals Outcome: Not Applicable Date Met:  08/26/14     

## 2014-08-26 NOTE — Plan of Care (Signed)
Problem: Phase I Progression Outcomes Goal: OOB as tolerated unless otherwise ordered Outcome: Not Met (add Reason) Patient is bed bound Goal: Initial discharge plan identified Outcome: Completed/Met Date Met:  08/26/14 Goal: Voiding-avoid urinary catheter unless indicated Outcome: Not Met (add Reason) Patient chronically uses a catheter

## 2014-08-26 NOTE — Progress Notes (Signed)
Patient pocketing applesauce and medication this evening.  Patient opened mouth and allowed RN to remove both from his mouth.  Patient refusing any food or drink this evening.  Medicated with PRN for pain.  Will continue to monitor.

## 2014-08-26 NOTE — Progress Notes (Signed)
UR completed Gurfateh Mcclain K. Kennesha Brewbaker, RN, BSN, MSHL, CCM  08/26/2014 2:20 PM

## 2014-08-26 NOTE — Progress Notes (Signed)
Merdis DelayK. Schorr, NP notified as patient has had two large, bloody BMs in the early morning and has no diet order.  Clear liquid diet ordered per MD notes and verbal order from NP.  Per NP, wait on AM labs to come back regarding blood loss.  Will continue to monitor.

## 2014-08-26 NOTE — Progress Notes (Addendum)
Inpatient [facility] Rm 3E 20 [facility]; pt is a DNR code status  Pt seen at bedside, sleeping, awakes to voice, non-verbal, not answering any questions writer attempted -pt closed eyes; Spoke with staff RN Tiffany and chart reviewed -pt received 2 UPRBC yesterday; Hgb =9.4 this morning; pt currently receiving IV fluids at 75 cc/hr; breakfast tray at bedside - not touched-minimal intake per staff has taken some pills by mouth and spits others out;  Per chart review pt has had 4 large tarry/dark burgandy stools with clots in the last 24 hours. Pt looks more debilitated, lethargic, opens eyes briefly, appears to reach out to something in front of him when nothing is there, then easily drifts off to sleep  Daughter Dois DavenportSandra arrived to room during my time on the unit she voiced she would like to speak with the doctor; stating initially she wanted to see what was causing all the bleeding but continues to have questions about pt's blood levels and need for future transfusions;  Dois DavenportSandra stated she is the primary caregiver for her father at home and, stated if indeed the doctors feel no invasive testing is warranted, she would like for the multiple bloody stools to subside before taking him home and she also expressed wanting to discuss option of residential hospice with [facility]  Writer discussed briefly Sandra's understanding of her father's continued decline and progressive cerebrovascular disease and she shared how her dad has been less engaged and not as talkative recently seems to be more withdrawn; she voiced she does want comfort for her dad and stated "I don't want to be the one to stand in the way of HIS plan" (meaning the Lord's plan) emotional support given to Dois DavenportSandra and affirmed her efforts to be a wonderful daughter and caregiver for her dad. Writer informed Dois DavenportSandra that [facility] SW, Erie NoeVanessa, will be contacted to speak with her about  residential hospice questions; Dois DavenportSandra cell number [phone] is best number to contact her today). Writer also informed Dois DavenportSandra that blood transfusions / IV fluids were not a treatment offered at residential hospice; Dois DavenportSandra voiced understanding and stated she plans to speak again with the doctors. [facility] home medication list, transfer summary and GOLD OOF DNR form on shadow chart [facility] team will continue to follow during hospital stay; above discussed with [facility] SW and attending Dr Butler Denmarkizwan.  Please contact [phone] with any hospice needs/ questions. Thank you  Valente DavidMargie Shye Doty, RN MSN [facility] Hospital Liaison [phone]

## 2014-08-27 ENCOUNTER — Inpatient Hospital Stay (HOSPITAL_COMMUNITY)

## 2014-08-27 DIAGNOSIS — G9340 Encephalopathy, unspecified: Secondary | ICD-10-CM

## 2014-08-27 DIAGNOSIS — I482 Chronic atrial fibrillation: Secondary | ICD-10-CM

## 2014-08-27 LAB — URINALYSIS, ROUTINE W REFLEX MICROSCOPIC
BILIRUBIN URINE: NEGATIVE
GLUCOSE, UA: NEGATIVE mg/dL
KETONES UR: NEGATIVE mg/dL
Nitrite: POSITIVE — AB
PH: 5.5 (ref 5.0–8.0)
Protein, ur: NEGATIVE mg/dL
Specific Gravity, Urine: 1.018 (ref 1.005–1.030)
Urobilinogen, UA: 0.2 mg/dL (ref 0.0–1.0)

## 2014-08-27 LAB — CBC
HCT: 25.6 % — ABNORMAL LOW (ref 39.0–52.0)
Hemoglobin: 8.6 g/dL — ABNORMAL LOW (ref 13.0–17.0)
MCH: 29.3 pg (ref 26.0–34.0)
MCHC: 33.6 g/dL (ref 30.0–36.0)
MCV: 87.1 fL (ref 78.0–100.0)
PLATELETS: 185 10*3/uL (ref 150–400)
RBC: 2.94 MIL/uL — AB (ref 4.22–5.81)
RDW: 15.6 % — ABNORMAL HIGH (ref 11.5–15.5)
WBC: 13.2 10*3/uL — ABNORMAL HIGH (ref 4.0–10.5)

## 2014-08-27 LAB — BASIC METABOLIC PANEL
Anion gap: 12 (ref 5–15)
BUN: 15 mg/dL (ref 6–23)
CALCIUM: 7.9 mg/dL — AB (ref 8.4–10.5)
CO2: 21 mEq/L (ref 19–32)
CREATININE: 0.79 mg/dL (ref 0.50–1.35)
Chloride: 114 mEq/L — ABNORMAL HIGH (ref 96–112)
GFR calc Af Amer: >90 mL/min (ref >90)
GFR, EST NON AFRICAN AMERICAN: 80 mL/min — AB (ref >90)
GLUCOSE: 99 mg/dL (ref 70–99)
Potassium: 3.7 mEq/L (ref 3.7–5.3)
Sodium: 147 mEq/L (ref 137–147)

## 2014-08-27 LAB — URINE MICROSCOPIC-ADD ON

## 2014-08-27 LAB — MRSA PCR SCREENING: MRSA by PCR: NEGATIVE

## 2014-08-27 MED ORDER — SODIUM CHLORIDE 0.9 % IV BOLUS (SEPSIS)
250.0000 mL | Freq: Once | INTRAVENOUS | Status: AC
  Administered 2014-08-27: 250 mL via INTRAVENOUS

## 2014-08-27 MED ORDER — CEFTRIAXONE SODIUM IN DEXTROSE 20 MG/ML IV SOLN
1.0000 g | INTRAVENOUS | Status: DC
  Administered 2014-08-27 – 2014-08-31 (×5): 1 g via INTRAVENOUS
  Filled 2014-08-27 (×6): qty 50

## 2014-08-27 MED ORDER — LABETALOL HCL 5 MG/ML IV SOLN
5.0000 mg | INTRAVENOUS | Status: DC | PRN
  Filled 2014-08-27: qty 4

## 2014-08-27 MED ORDER — METOPROLOL TARTRATE 1 MG/ML IV SOLN
5.0000 mg | Freq: Four times a day (QID) | INTRAVENOUS | Status: DC
  Administered 2014-08-28 – 2014-09-01 (×19): 5 mg via INTRAVENOUS
  Filled 2014-08-27 (×22): qty 5

## 2014-08-27 MED ORDER — PANTOPRAZOLE SODIUM 40 MG PO PACK
40.0000 mg | PACK | Freq: Every day | ORAL | Status: DC
  Filled 2014-08-27 (×2): qty 20

## 2014-08-27 MED ORDER — ACETAMINOPHEN 325 MG RE SUPP
325.0000 mg | Freq: Once | RECTAL | Status: AC
  Administered 2014-08-27: 325 mg via RECTAL
  Filled 2014-08-27: qty 1

## 2014-08-27 MED ORDER — ACETAMINOPHEN 650 MG RE SUPP
650.0000 mg | Freq: Once | RECTAL | Status: AC
  Administered 2014-08-27: 650 mg via RECTAL
  Filled 2014-08-27: qty 1

## 2014-08-27 MED ORDER — ATROPINE SULFATE 1 % OP SOLN
2.0000 [drp] | Freq: Four times a day (QID) | OPHTHALMIC | Status: DC
  Administered 2014-08-27 – 2014-09-01 (×20): 2 [drp] via SUBLINGUAL
  Filled 2014-08-27: qty 2

## 2014-08-27 MED ORDER — PANTOPRAZOLE SODIUM 40 MG PO TBEC: 40.0000 mg | DELAYED_RELEASE_TABLET | Freq: Every day | ORAL | Status: DC

## 2014-08-27 MED ORDER — LORAZEPAM 2 MG/ML IJ SOLN: 0.5000 mg | Freq: Four times a day (QID) | INTRAMUSCULAR | Status: DC | PRN

## 2014-08-27 MED ORDER — ATROPINE SULFATE 1 % OP SOLN
2.0000 [drp] | Freq: Four times a day (QID) | OPHTHALMIC | Status: DC
  Administered 2014-08-27 (×2): 2 [drp] via OPHTHALMIC
  Filled 2014-08-27: qty 2

## 2014-08-27 MED ORDER — MORPHINE SULFATE 2 MG/ML IJ SOLN
2.0000 mg | INTRAMUSCULAR | Status: DC | PRN
  Administered 2014-08-28 – 2014-08-30 (×6): 2 mg via INTRAVENOUS
  Filled 2014-08-27 (×6): qty 1

## 2014-08-27 MED ORDER — CETYLPYRIDINIUM CHLORIDE 0.05 % MT LIQD
7.0000 mL | Freq: Two times a day (BID) | OROMUCOSAL | Status: DC
  Administered 2014-08-27 – 2014-09-01 (×11): 7 mL via OROMUCOSAL

## 2014-08-27 NOTE — Progress Notes (Signed)
Daily Rounding Note  08/27/2014, 9:16 AM  LOS: 5 days   SUBJECTIVE:       2 large episodes of hematochezia yesterday.  Not c/o pain to staff.  Marcha Duttontking limited  Pos, better than yesterday when he was refusing pos  OBJECTIVE:         Vital signs in last 24 hours:    Temp:  [98 F (36.7 C)-102.3 F (39.1 C)] 100.6 F (38.1 C) (11/11 0711) Pulse Rate:  [79-89] 88 (11/11 0651) Resp:  [18-20] 18 (11/11 0651) BP: (116-130)/(51-78) 122/78 mmHg (11/11 0651) SpO2:  [99 %-100 %] 100 % (11/11 0651) Weight:  [131 lb 2.4 oz (59.489 kg)] 131 lb 2.4 oz (59.489 kg) (11/11 0651) Last BM Date: 08/26/14 Filed Weights   08/25/14 0808 08/26/14 0511 08/27/14 0651  Weight: 141 lb (63.957 kg) 131 lb 3.2 oz (59.512 kg) 131 lb 2.4 oz (59.489 kg)   General: looks ill,  cachectic   Heart: RRR Chest: clear bil in front.  Breathing quietly without effort. No cough Abdomen: soft, NT, BS hypoactive.  ND  Extremities: contractures of upper and lower extremities.  Neuro/Psych:  Not oriented.  Not speaking to me, mumbling to nurses aide.   Intake/Output from previous day: 11/10 0701 - 11/11 0700 In: 2191.8 [I.V.:1691.8; IV Piggyback:500] Out: 1125 [Urine:1125]  Intake/Output this shift:    Lab Results:  Recent Labs  08/26/14 0735 08/26/14 1318 08/27/14 0530  WBC 12.0* 10.5 13.2*  HGB 9.4* 9.1* 8.6*  HCT 27.7* 26.9* 25.6*  PLT 193 184 185   BMET  Recent Labs  08/25/14 0602 08/26/14 0735 08/27/14 0530  NA 148* 146 147  K 3.2* 4.6 3.7  CL 119* 116* 114*  CO2 18* 20 21  GLUCOSE 84 117* 99  BUN 22 20 15   CREATININE 0.77 0.81 0.79  CALCIUM 6.5* 8.3* 7.9*   LFT No results for input(s): PROT, ALBUMIN, AST, ALT, ALKPHOS, BILITOT, BILIDIR, IBILI in the last 72 hours. PT/INR No results for input(s): LABPROT, INR in the last 72 hours. Hepatitis Panel No results for input(s): HEPBSAG, HCVAB, HEPAIGM, HEPBIGM in the last 72  hours.  Studies/Results: Dg Chest Port 1 View  08/27/2014   CLINICAL DATA:  Fever  EXAM: PORTABLE CHEST - 1 VIEW  COMPARISON:  03/25/2014  FINDINGS: Study is limited by patient's rotation. No pulmonary edema. Degenerative changes thoracic spine. Old fracture of the left fourth rib. There is left base retrocardiac streaky atelectasis or infiltrate.  IMPRESSION: No pulmonary edema. Left base retrocardiac streaky atelectasis or infiltrate.   Electronically Signed   By: Natasha MeadLiviu  Pop M.D.   On: 08/27/2014 08:21   Scheduled Meds: . sodium chloride   Intravenous Once  . sodium chloride   Intravenous Once  . sodium chloride   Intravenous Once  . amLODipine  10 mg Oral Daily  . antiseptic oral rinse  7 mL Mouth Rinse BID  . atorvastatin  40 mg Oral QHS  . brinzolamide  1 drop Both Eyes BID  . carvedilol  12.5 mg Oral BID WC  . feeding supplement (PRO-STAT SUGAR FREE 64)  30 mL Oral TID WC  . mupirocin ointment  1 application Nasal BID  . pantoprazole (PROTONIX) IV  40 mg Intravenous Q12H  . sodium chloride  3 mL Intravenous Q12H   Continuous Infusions: . sodium chloride 75 mL/hr at 08/27/14 0542   PRN Meds:.acetaminophen **OR** acetaminophen, alum & mag hydroxide-simeth, HYDROmorphone (DILAUDID) injection, LORazepam, ondansetron **  OR** ondansetron (ZOFRAN) IV, oxyCODONE   ASSESMENT:   * GI bleed: Hematochezia continues off Eliquis.  Constipation and fecal impaction requiring disimpaction 2 weeks PTA.  CT scan confirms diverticulosis, no cancer.   * ABL anemia. S/p 6 PRBCs so far. Hgb improved.   * S/p CVA, in hospice care PTA. On Eliquis PTA, none for at least 7 days.     PLAN   *  Not in condition to undergo colonoscopy, suspect he is too contractured to undergo nuclear RBC scan so continue observation.   *  Switch to once daily PPI po, this is not an UGIB.    Jennye MoccasinSarah Gribbin  08/27/2014, 9:16 AM Pager:  [phone]   ________________________________________________________________________  [facility] MD note:  I personally examined the patient, reviewed the data and agree with the assessment and plan described above.  The next usual step is colonoscopy at this point.  That would require NG tube placement for him to undergo the prep.  Reading recent hospice note from today it doesn't look like that is the direction that the family wishes to proceed.  Will sign off for now.  Please let me know if there are any other questions or concerns.   Rob Buntinganiel Bird Swetz, MD [facility] Pager [phone]

## 2014-08-27 NOTE — Progress Notes (Signed)
Report given to receiving RN. Patient in bed resting. No verbal complaints and no signs of distress noted. 

## 2014-08-27 NOTE — Progress Notes (Signed)
[facility]; pt is a DNR code status  Pt seen at bedside, sleeping, awakes to voice, answers 'Ok" and garbled speech which can hear 'my leg' Staff Aide indicated pt able to communicate wanting teeth brushed;  Cahrt reviewed and spoke with staff RN Joel [name]- pt pocketing applesauce/ meds,continued to have bloody stools overnight and Rectal temp 102.1 down to 101 after Tylenol PR; staff RN Joel [name] did speak with pt's daughter Joel [name] this morning who is aware of patient's status Spoke with Dr Joel [name] who informs pt has new UTI and plan to initiate antibiotics and speak further with patient's daughter;  Ranae PlumberWriter contacted daughter Joel [name] [phone]) who voiced she was aware of pt's continued decline and stated it appears that the interventions tried (r/t bleeding) are not working; she voiced she wants comfort, would be agreeable to treating UTI as this would hopefull make her dad more comfortable. She informed she had [facility] and requests a return call; daughter plans to be at the hospital today approximately 11:30 and is hopeful to talk with attending Dr Joel [name] when she is here. [facility] home medication list, transfer summary and GOLD OOF DNR form on shadow chart [facility] team will continue to follow during hospital stay; above discussed with [facility] SW who will be talking with Joel [name] today. Please contact [phone] with any hospice needs/ questions. Thank you Joel DavidMargie Rosell Khouri, RN MSN [facility] Hospital Liaison [phone]

## 2014-08-27 NOTE — Progress Notes (Signed)
Patient refusing POs, including medications.  Offered clear liquids and attempted to swab patient's mouth per oral care protocol, but patient turned head away and clenched lips together.  Patient repositioned in bed throughout shift with 2 assist.  Will continue to monitor.

## 2014-08-27 NOTE — Progress Notes (Signed)
Follow-up visit to speak with daughter Dois DavenportSandra who was at bedside with [facility] SW Erie NoeVanessa; daughter shared she had spoken with Dr Rhona Leavenshiu; she voiced they talked about the fact that her father has a UTI and Dr Rhona Leavenshiu is going to start an antibiotic and see how he does in the next couple of days; Dois DavenportSandra acknowledges the antibiotic will likely not have much effect on his overall condition especially as Mr Lequita HaltMorgan continues to have loose bloody stools; she stated she would be in favor of no further transfusions and plans to speak with her sister; she would like to see if the antibiotic has some effect; discussed with Dois DavenportSandra some of the s/sx she is seeing: (decreased responsiveness; minimal PO intake, pt has begun pocketing meds/food, spitting, intermittent confusion, pain) are s/sx of further decline and transition at EOL and Dois DavenportSandra while tearful stated she knew that his time was very limited and wants him to be comfortable.  She stated she is looking to the doctors for guidance on direction of current treatments as this is overwhelming to her- emotional support given. Dois DavenportSandra voiced that she would [facility], if pt was at EOL Per discussion with staff, RN Fatima BlankKeke pt noted to have increased upper airway secretions and as day went on, increased pain with repositioning/personal care; Staff RN has requested Atropine drops for secretions and liquid Morphine for pain control- these symptom management medications were discussed with Dois DavenportSandra who was agreeable stating "I want him to be comfortable" ; IV Rocephin started today for UTI; [facility] Team continues to follow. Please call [facility] at [phone] with any questions Valente DavidMargie Icey Tello, RN MSN [facility] Hospital Liaison [phone]

## 2014-08-27 NOTE — Progress Notes (Signed)
Patient's rectal temp from 102.1 to 101.1 after 650mg  Tylenol suppository. Patient lost some of suppository due to continued rectal bleeding.  NP on call notified.  Blood cultures ordered.  250mL bonus and additional acetaminophen suppository ordered and administered.  Will continue to monitor.

## 2014-08-27 NOTE — Progress Notes (Signed)
At recheck, patient's temperature 101.2.  NP on call notified, additional bolus ordered and started.  Will continue to monitor.

## 2014-08-27 NOTE — Progress Notes (Signed)
[facility] PROGRESS NOTE  Joel [name]:[id number] DOB: 06-Oct-1928 DOA: 08/22/2014 PCP: Georgann Housekeeper, MD  Assessment/Plan: 1. Acute blood loss anemia 1. Pt has required multiple units of PRBC transfusions with hgb continuing to trend down 2. GI had been following 3. Pt remains off anticoagulant per GI recs 2. Afib 1. Off anticoagulant 2. Rate controlled 3. Hypernatremia 1. Levels normalized 4. HTN 1. BP stable and controlled 5. Stage 3 sacral decub ulcers 1. Stable 6. Hx CVA 1. Stable. Pt essentially bedbound 7. UTI with sepsis 1. Fevers, leukocytosis, and ecoli on urine cx. 2. Will start rocephin 8. DVT prophylaxis 1. SCD's  Code Status: DNR Family Communication: Pt in room Disposition Plan: Pending  Consultants:    Procedures:    Antibiotics:    HPI/Subjective: Pt noted to be febrile overnight  Objective: Filed Vitals:   08/27/14 0711 08/27/14 1139 08/27/14 1242 08/27/14 1434  BP:  127/67  136/71  Pulse:  82  79  Temp: 100.6 F (38.1 C)  101.2 F (38.4 C) 100.6 F (38.1 C)  TempSrc: Rectal  Axillary Axillary  Resp:    26  Height:      Weight:      SpO2:    98%    Intake/Output Summary (Last 24 hours) at 08/27/14 1520 Last data filed at 08/27/14 1115  Gross per 24 hour  Intake 2251.75 ml  Output   1325 ml  Net 926.75 ml   Filed Weights   08/25/14 0808 08/26/14 0511 08/27/14 0651  Weight: 63.957 kg (141 lb) 59.512 kg (131 lb 3.2 oz) 59.489 kg (131 lb 2.4 oz)    Exam:   General:  Awake, tired-appearing, in nad  Cardiovascular: regular, s1, s2  Respiratory: normal resp effort, no wheezing  Abdomen: soft,nondistended  Musculoskeletal: perfused, no clubbing   Data Reviewed: Basic Metabolic Panel:  Recent Labs Lab 08/22/14 2258 08/24/14 0310 08/24/14 0730 08/25/14 0602 08/26/14 0735 08/27/14 0530  NA 141 142  --  148* 146 147  K 3.0* 3.2*  --  3.2* 4.6 3.7  CL 103 106  --  119* 116* 114*  CO2 22 20  --  18* 20  21  GLUCOSE 119* 128*  --  84 117* 99  BUN 23 30*  --  22 20 15   CREATININE 1.16 1.31  --  0.77 0.81 0.79  CALCIUM 8.2* 8.0*  --  6.5* 8.3* 7.9*  MG  --   --  1.6  --   --   --    Liver Function Tests:  Recent Labs Lab 08/22/14 2258  AST 10  ALT <5  ALKPHOS 67  BILITOT 0.2*  PROT 5.9*  ALBUMIN 2.3*   No results for input(s): LIPASE, AMYLASE in the last 168 hours. No results for input(s): AMMONIA in the last 168 hours. CBC:  Recent Labs Lab 08/22/14 2258  08/24/14 1925 08/25/14 0602 08/26/14 0735 08/26/14 1318 08/27/14 0530  WBC 9.0  --   --  8.8 12.0* 10.5 13.2*  NEUTROABS  --   --   --   --   --  7.7  --   HGB 7.0*  < > 6.7* 7.9* 9.4* 9.1* 8.6*  HCT 21.0*  < > 19.4* 23.0* 27.7* 26.9* 25.6*  MCV 84.0  --   --  84.6 85.2 87.1 87.1  PLT 310  --   --  173 193 184 185  < > = values in this interval not displayed. Cardiac Enzymes: No results for input(s): CKTOTAL,  CKMB, CKMBINDEX, TROPONINI in the last 168 hours. BNP (last 3 results) No results for input(s): PROBNP in the last 8760 hours. CBG:  Recent Labs Lab 08/26/14 0721  GLUCAP 220*    Recent Results (from the past 240 hour(s))  Urine culture     Status: None   Collection Time: 08/23/14 12:02 AM  Result Value Ref Range Status   Specimen Description URINE, RANDOM  Final   Special Requests NONE  Final   Culture  Setup Time   Final    08/23/2014 10:49 Performed at [facility]    Colony Count   Final    >=100,000 COLONIES/ML [facility]formed at [facility]    Culture   Final    ESCHERICHIA COLI Performed at [facility]    Report Status 08/26/2014 FINAL  Final   Organism ID, Bacteria ESCHERICHIA COLI  Final      Susceptibility   Escherichia coli - MIC*    AMPICILLIN >=32 RESISTANT Resistant     CEFAZOLIN >=64 RESISTANT Resistant     CEFTRIAXONE <=1 SENSITIVE Sensitive     CIPROFLOXACIN >=4 RESISTANT Resistant     GENTAMICIN >=16 RESISTANT Resistant     LEVOFLOXACIN >=8  RESISTANT Resistant     NITROFURANTOIN <=16 SENSITIVE Sensitive     TOBRAMYCIN 8 INTERMEDIATE Intermediate     TRIMETH/SULFA <=20 SENSITIVE Sensitive     PIP/TAZO >=128 RESISTANT Resistant     * ESCHERICHIA COLI  MRSA PCR Screening     Status: None   Collection Time: 08/27/14 12:51 PM  Result Value Ref Range Status   MRSA by PCR NEGATIVE NEGATIVE Final    Comment:        The GeneXpert MRSA Assay (FDA approved for NASAL specimens only), is one component of a comprehensive MRSA colonization surveillance program. It is not intended to diagnose MRSA infection nor to guide or monitor treatment for MRSA infections.      Studies: Dg Chest Port 1 View  08/27/2014   CLINICAL DATA:  Fever  EXAM: PORTABLE CHEST - 1 VIEW  COMPARISON:  03/25/2014  FINDINGS: Study is limited by patient's rotation. No pulmonary edema. Degenerative changes thoracic spine. Old fracture of the left fourth rib. There is left base retrocardiac streaky atelectasis or infiltrate.  IMPRESSION: No pulmonary edema. Left base retrocardiac streaky atelectasis or infiltrate.   Electronically Signed   By: Natasha MeadLiviu  Pop M.D.   On: 08/27/2014 08:21    Scheduled Meds: . amLODipine  10 mg Oral Daily  . antiseptic oral rinse  7 mL Mouth Rinse BID  . atorvastatin  40 mg Oral QHS  . atropine  2 drop Both Eyes QID  . brinzolamide  1 drop Both Eyes BID  . carvedilol  12.5 mg Oral BID WC  . cefTRIAXone (ROCEPHIN)  IV  1 g Intravenous Q24H  . feeding supplement (PRO-STAT SUGAR FREE 64)  30 mL Oral TID WC  . mupirocin ointment  1 application Nasal BID  . pantoprazole sodium  40 mg Oral Daily  . sodium chloride  3 mL Intravenous Q12H   Continuous Infusions: . sodium chloride 75 mL/hr at 08/27/14 0542    Principal Problem:   GI bleed Active Problems:   Acute encephalopathy   Atrial fibrillation   HTN (hypertension)   Dyslipidemia   Sacral decubitus ulcer, stage III   Diabetes mellitus without complication   Coagulopathy    Late effects of CVA (cerebrovascular accident)   Hypokalemia   Diverticulosis   Decubitus  ulcer of heel   Blood loss anemia   Long term current use of anticoagulant therapy   Sacral decubitus ulcer   Protein-calorie malnutrition, severe   Time spent: 35min   CHIU, STEPHEN K  [facility] Pager [phone]. If 7PM-7AM, please contact night-coverage at [email or web address], password [email or web address] 08/27/2014, 3:20 PM  LOS: 5 days

## 2014-08-27 NOTE — Progress Notes (Signed)
ANTIBIOTIC CONSULT NOTE - INITIAL  Pharmacy Consult for Ceftriaxone Indication:  E. Coli UTI  No Known Allergies  Patient Measurements: Height: 5\' 9"  (175.3 cm) Weight: 131 lb 2.4 oz (59.489 kg) IBW/kg (Calculated) : 70.7   Vital Signs: Temp: 100.6 F (38.1 C) (11/11 0711) Temp Source: Rectal (11/11 0711) BP: 122/78 mmHg (11/11 0651) Pulse Rate: 88 (11/11 0651) Intake/Output from previous day: 11/10 0701 - 11/11 0700 In: 2191.8 [I.V.:1691.8; IV Piggyback:500] Out: 1125 [Urine:1125] Intake/Output from this shift: Total I/O In: 60 [P.O.:60] Out: -   Labs:  Recent Labs  08/25/14 0602 08/26/14 0735 08/26/14 1318 08/27/14 0530  WBC 8.8 12.0* 10.5 13.2*  HGB 7.9* 9.4* 9.1* 8.6*  PLT 173 193 184 185  CREATININE 0.77 0.81  --  0.79   Estimated Creatinine Clearance: 57.8 mL/min (by C-G formula based on Cr of 0.79). No results for input(s): VANCOTROUGH, VANCOPEAK, VANCORANDOM, GENTTROUGH, GENTPEAK, GENTRANDOM, TOBRATROUGH, TOBRAPEAK, TOBRARND, AMIKACINPEAK, AMIKACINTROU, AMIKACIN in the last 72 hours.   Microbiology: Recent Results (from the past 720 hour(s))  Urine culture     Status: None   Collection Time: 08/23/14 12:02 AM  Result Value Ref Range Status   Specimen Description URINE, RANDOM  Final   Special Requests NONE  Final   Culture  Setup Time   Final    08/23/2014 10:49 Performed at [facility]    Colony Count   Final    >=100,000 COLONIES/ML Performed at [facility]    Culture   Final    ESCHERICHIA COLI Performed at [facility]    Report Status 08/26/2014 FINAL  Final   Organism ID, Bacteria ESCHERICHIA COLI  Final      Susceptibility   Escherichia coli - MIC*    AMPICILLIN >=32 RESISTANT Resistant     CEFAZOLIN >=64 RESISTANT Resistant     CEFTRIAXONE <=1 SENSITIVE Sensitive     CIPROFLOXACIN >=4 RESISTANT Resistant     GENTAMICIN >=16 RESISTANT Resistant     LEVOFLOXACIN >=8 RESISTANT Resistant     NITROFURANTOIN  <=16 SENSITIVE Sensitive     TOBRAMYCIN 8 INTERMEDIATE Intermediate     TRIMETH/SULFA <=20 SENSITIVE Sensitive     PIP/TAZO >=128 RESISTANT Resistant     * ESCHERICHIA COLI    Medical History: Past Medical History  Diagnosis Date  . A-fib   . Hypertension   . Rib fracture 12/29/2013    "fell in his home; due to the stroke"  . CVA (cerebral infarction)   . Encephalopathy   . Hypercholesterolemia   . Pneumonia 12/2013  . Stroke 12/29/2013    "weaker on left side since; vision, bladder, vision problems since" (02/19/2014)  . Arthritis     "knees; fingers" (02/19/2014)  . Foley catheter in place     "came into hospital w/it" (02/19/2014)    Medications:  Prescriptions prior to admission  Medication Sig Dispense Refill Last Dose  . amLODipine (NORVASC) 10 MG tablet Take 10 mg by mouth daily.   08/21/2014 at Unknown time  . apixaban (ELIQUIS) 5 MG TABS tablet Take 5 mg by mouth 2 (two) times daily.   08/21/2014 at Unknown time  . atorvastatin (LIPITOR) 40 MG tablet Take 40 mg by mouth at bedtime.    08/21/2014 at Unknown time  . brinzolamide (AZOPT) 1 % ophthalmic suspension Place 1 drop into both eyes 2 (two) times daily.   08/21/2014 at Unknown time  . carvedilol (COREG) 12.5 MG tablet Take 12.5 mg by mouth 2 (two)  times daily with a meal.   08/21/2014 at 1800  . ibuprofen (ADVIL,MOTRIN) 600 MG tablet Take 600 mg by mouth 4 (four) times daily.   08/21/2014 at Unknown time  . lidocaine (LIDODERM) 5 % Place 1 patch onto the skin daily. Remove & Discard patch within 12 hours or as directed by MD   08/21/2014 at Unknown time  . LORazepam (ATIVAN) 0.5 MG tablet Take 0.5 mg by mouth every 6 (six) hours as needed (agitation).   08/21/2014 at Unknown time  . mupirocin ointment (BACTROBAN) 2 % Place 1 application into the nose 2 (two) times daily.   08/21/2014 at Unknown time  . oxyCODONE-acetaminophen (PERCOCET/ROXICET) 5-325 MG per tablet Take 1 tablet by mouth 3 (three) times daily as needed for moderate  pain.    08/22/2014 at Unknown time  . cephALEXin (KEFLEX) 500 MG capsule Take 1 capsule (500 mg total) by mouth 4 (four) times daily. 28 capsule 0    Scheduled:  . amLODipine  10 mg Oral Daily  . antiseptic oral rinse  7 mL Mouth Rinse BID  . atorvastatin  40 mg Oral QHS  . atropine  2 drop Both Eyes QID  . brinzolamide  1 drop Both Eyes BID  . carvedilol  12.5 mg Oral BID WC  . cefTRIAXone (ROCEPHIN)  IV  1 g Intravenous Q24H  . feeding supplement (PRO-STAT SUGAR FREE 64)  30 mL Oral TID WC  . mupirocin ointment  1 application Nasal BID  . pantoprazole sodium  40 mg Oral Daily  . sodium chloride  3 mL Intravenous Q12H   Assessment: 80 y.o male admitted 11/7 due GI bleed- anemia due to acute blood loss- Diverticulosis noted on CT. PMH as noted above including on Eliquis PTA for h/o Afib and s/p CVA- eliquis is hold due to GIB and GI recommends not restarting eliquis given GIB as well as multiple other co-morbid conditions. DVT prophylaxis: SCD This patient has chronic foley.  Confusion reported. 11/7 urine culture + E.coli,  resisitant to all antibiotics tested except it is senstive to Ceftriaxone, nitrofurantoin (not recommended to use in elderly) and TMP/SMX (MIC </=20).    Plan:  Ceftriaxone 1gm IV q24h No adjustments required in this medication for any changes in renal function thus pharmacy will sign off. Please reconsult pharmacy if further assistance needed.  Noah Delaineuth Audie Wieser, RPh Clinical Pharmacist Pager: [phone] 08/27/2014,10:36 AM

## 2014-08-27 NOTE — Progress Notes (Signed)
Patient's temperature 100.1 rectally at recheck.  Second <MEASUREMERU#[id number] PigJen57SwazLake Whitney Medical Centerila416JoZam45KentuckZOXMelrose NakayaKentuck6383244mZOXW48R'2Malen GCasimiro NeSt Marys Surgical Center LLCedlVer479-1Vassie Wilki56e MiLucJu- 1Jonny RuizAllayneApolinarCaliforniBoise Va Medical16 [phone](2148Z(860) 87JXMarEphriam KnuLeiKentuckygEdmon55iaCommunity Hospitals And Wellness Centers BryanKentuckyDNess County HospitaDebbora PAndeAVW:[facility](82<MEASUREMENTRU#[id number] PigJen57SwazDelmarva Endoscopy Center LLCilaMelrose NakayaKentuck1243244mZOXWR'2965WesMalen GCasimiro NeSilver Summit Medical Corporation Premier Surgery Center Dba Bakersfield Endoscopy CenteredlVer878-3Vassie Wilki70e MiLucJu- 1Jonny RuizAllayneApolinarCaliforniPike County Memorial H49o468620-311-839282Z639-18JXMarEphriam KnuLeiKent2ucPhs Indian Hospital Rosebudk1761Du317KentuckykChristus Dubuis Hospital Of AlexandriDebbora PAndeAVW:[place] Endosurge(27<MEASUREMENTRU#161020Ann63Mosetta PigJen57SwazMarion Eye Specialists Surgery Centerila416JoZam45KentuckZMelrose NakayaKentuck7373244mZOXWR'149EMalen GCasimiro NeRivendell Behavioral Health ServicesedlVer825-6Vassie Wilki45e MiLucJu- 1Jonny RuizAllayneApolinarCaliforniTyler Continue Care [phone] KnuLeKentuckyiEdmoniaDDeon PillMethodi64stTeche Regional Medical Center 496Kentucky1Woodlawn HospitaDebbora PAndeAVW:[facility] Surge(91<MEASUREMENTRU#161062Ann52Mosetta PigJen57SwazSmokey Point Behaivoral Hospitalila416JoZam45KentuckZOXWRil7705Leotis ShAilMelrose NakayaKentuck553244mZOXWR'29Cen14traMalen GCasimiro NeTruman Medical Center - Hospital HilledlVer934-5Vassie Wilki63e MiLucJu- 1Jonny RuizAllayneApolinarCaliforniSanford Canby Medical64 [phone](364) 78JXMarEphriam KnuLeiKentuckygEdmoniaDDeon40 PArizona State HoKentuckysAdventist Midwest Health Dba Adventist Hinsdale HospitaDebbora PAndeAVW:[place]21<MEASUREMENTRU#161085Ann57Mosetta PigJen57SwazSierra Vista Regional Health Centerila416JoZam45KentuckZOXWRi435LeotisMelrose NakayaKentuck733244mZOXWR'2962SpeMalen GCasimiro NeFort Myers Surgery CenteredlVer857-3Vassie Wilki56e MiLucJu- 1Jonny RuizAllayneApolinarCaliforniCarolina Surgery Center LLC Dba The Surgery Center At [phone] KnuLeiKentuckygEdm74onOutpatient Surgical Care Ltdi2261DuKentucky3[facility] Northeast HoustoDebbora PAndeAVW:[facility] Ce<MEASUREMENTRU#161030Ann48Mosetta PigJen57SwazSwedish Medical Centerila416JoZam45KentuckZOXWRil7065Melrose NakayaKentuck4643244mZOXW55R'2Malen GCasimiro NeSurgery Center Of Fairbanks LLCedlVer(531)8Vassie Wilki65e MiLucJu- 1Jonny RuizAllayneApolinarCaliforniPacific Orange Hospit31a466714 751 052([phone] KKentuckynE42dmSaint Clare'S HospitaloKentucky5West Las [facility] Surgery Center LLC Dba Valley View Surgery CenteDebbora PAndeAVW:[facility]<MEASUREMENTRU#161072Ann77Mosetta PigJen57SwazBon Secours Community Hospitalila416JoZam45KentuckZOXWR(712)LeoMelrose NakayaKentuck2273244mZOXWR'769ByMalen GCasimiro NeLouisville Surgery CenteredlVer514 8Vassie Wilki55e MiLucJu- 1Jonny RuizAllayneApolinarCaliforniSo Crescent Beh Hlth Sys - Crescent Pines48 [phone]m KnuLeiKentuckygEdmon24iaUchealth Grandview HKentuckyoSharp Coronado Hospital And Healthcare CenteDebbora PAndeAVW:[facility] Val87<MEASUREMENTRU#161090Ann42Mosetta PigJen57SwazSt PetMelrose NakayaKentuck6833244mZOXWR'297EasMalen GCasimiro NeBrunswick Pain Treatment Center LLCedlVer801Vassie Wilki8e MiLucJu- 1Jonny RuizAllayneApolinarCaliforniUniversity Of Md Shore Medical Center At52 [phone]([phone] KnKentuckyuEdmonia54DDCaribou Memorial Hospital And Living CenKentuckytSuburban HospitaDebbora PAndeAVW:[facility] Surge<MEASUREMENTRU#161034Ann16Mosetta PigJen57SwazChilton Memorial Hospitalila416Melrose NakayaKentuck4453244mZOX47WR'Malen GCasimiro NeMark Fromer LLC Dba Eye Surgery Centers Of New YorkedlVer224-7Vassie Wilki66e MiLucJu- 1Jonny RuizAllayneApolinarCaliforniHealing Arts Day [phone](9521Z802-77JXMarEphriam KKentuckynEdmoniaDDeon PillVa Puget Sound Hea22ltNorth Point Surgery CenteKentuckyrKershawhealtDebbora PAndeAVW:[place] [place] State(903<MEASUREMENTRU#161073Ann70Mosetta PigJen57SwazCentennial Surgery Center LPila416JoZam45KenMelrose NakayaKentuck6273244mZOXWR'799LMalen GCasimiro NeCleveland Asc LLC Dba Cleveland Surgical SuitesedlVer986 5Vassie Wilki43e MiLucJu- 1Jonny RuizAllayneApolinarCaliforniEwing Residential70 [phone] KnuKent71ucNebraska Orthopaedic Hospitalk11061Du366ke MaKentuckyrCenterpointe Hospital Of ColumbiDebbora PAndeAVW:[facility] Medic92<MEASUREMENTRU#161093Ann20Mosetta PigJen57SwazShriners' Hospital For Childrenila416JoZam45KentuckZOXWR(708LeotisMelrose NakayaKentuck603244mZOXWR'2915WinMalen GCasimiro NeTimberlake Surgery CenteredlVer630-8Vassie Wilki46e MiLucJu- 1Jonny RuizAllayneApolinarCaliforniSeattle Cancer Care [phone]m KnuLKentuckyeEdmoniaDDeon PillBeltw31ayChi Memorial Hospital-GeorKentuckygMount Sinai Hospital - Mount Sinai Hospital Of QueeKentuck77ynNadin604529CollinVeryl SVassie LolLuci- 1Jonny RuizAllayne [phone] KnuKentuckyckWDemHyacinth [phone] SleeWUWUX9861D[facility]6962Boneta LucMorrieKentuck64y Nadin604529PaVeryl SVassie LolLuci- 1Jonny RuizAllayne G4034367-717-ZO58JXBEphriam KnuKentuckyckWDemHyacinth [phone] WUWUX5261Duke Memorial Hermann [place]6962Boneta LucMorrie SheZOKentuckyX409Debbora <MEASUREMENTRU#161037Ann62Mosetta PigJen57SwazHighpoint Healthila416JoZam45KentuckMelrose NakayaKentuck2273244mZOX39WR'Malen GCasimiro NeWilson Memorial HospitaledlVer628-2Vassie Wilki50e MiLucJu- 1Jonny RuizAllayneApolinarCaliforniCoastal Eye Surgery65 [phone]hriam KnuLKentuckyeEdm37onJeffersonKentucky Regional Hospital For Respiratory & Complex CarDebbora PAndeAVW:[facility] 57<MEASUREMENTRU#16101Ann34Mosetta PigJen57SwazSummit Asc LLPila416JoZam45KentuckZOXWR5Leotis ShAilPGuinea-B6DanellCoordinatedMelrose NakayaKentuck61043244mZOXW88R'2Malen GCasimiro NeInova Loudoun HospitaledlVer(909)3Vassie Wilki51e MiLucJu- 1Jonny RuizAllayneApolinarCaliforniCoryell Memorial [phone]([phone] 55JXMarEphriam KnuKentuckyLEdmoniaDDeon PillEncompa12ssGi Diagnostic EndosKentuckycHialeah HospitaDebbora PAndeAVW:[facility](42<MEASUREMENTRU#161057Ann52Mosetta PigJen57SwazAllendale County Hospitalila416JoZamMelrose NakayaKentuck4793244mZOXWR3'29Malen GCasimiro NeAmarillo Endoscopy CenteredlVer580Vassie Wilki77e MiLucJu- 1Jonny RuizAllayneApolinarCaliforniBaptist Health3 [id number] KnKentuckyuEdmon25iaChannel Islands SurgicentKentuckyeVital Sight PDebbora PAndeAVW:[facility](970<MEASUREMENTRU#16107Ann60Mosetta PigJen57SwazKirkbride Centerila416JoZam45KentuckZOXWRil7462 Sou941Leotis ShAilPGuineaMelrose NakayaKentuck1253244mZOXW23R'2Malen GCasimiro NeThe Friary Of Lakeview CenteredlVer775-2Vassie Wilki42e MiLucJu- 1Jonny RuizAllayneApolinarCaliforniBehavioral Healthcare Center At [phone](75430Z256-36JXMarEphriam KnuLeig1Kentucky5EdmoniaDDeon P51ilMontefiore Mount Vernon HospitalKentuckylSelect Specialty Hospital - Northeast New JerseDebbora PAndeAVW:[place] Endosco(959<MEASUREMENTRU#161027Ann15Mosetta PigJen57SwazChalmers P. Wylie Va Ambulatory Care Centerila416JoZam4Melrose NakayaKentuck4413244mZOXWR'709EMalen GCasimiro NeTexas Health Harris Methodist Hospital SouthlakeedlVer774-7Vassie Wilki27e MiLucJu- 1Jonny RuizAllayneApolinarCaliforniRumford [phone] KnuLKentuckyeEdmonia53DDMedical Park Tower SurgeKentuckyrSheepshead Bay Surgery CenteDebbora PAndeAVW:Baptist84<MEASUREMENTRU#16105Ann23Mosetta PigJen57SwazSt Joseph'S Hospitalila416JoZam45KentuckZOXWRil57 We636Leotis ShMelrose NakayaKentuck203244mZOXW71R'2Malen GCasimiro NeHss Palm Beach Ambulatory Surgery CenteredlVer305-1Vassie Wilki56e MiLucJu- 1Jonny RuizAllayneApolinarCaliforniVa Medical Center - Nashville1 [phone](60468Z305-07JXMarEphriam KnuLeig17Kentucky5Ed63moBellevue Ambulatory Surgery Centern2361Du53ke EncomKentuckypKidspeace National Centers Of New EnglanDebbora PAndeAVW:[facility] Inc Ps Dba Gateway Surge60<MEASUREMENTRU#161022Ann21Mosetta PigJen57SwazFroedtert Surgery Center Melrose NakayaKentuck4623244mZOXWR'309SMalen GCasimiro NeWray Community District HospitaledlVer(269)6Vassie Wilki22e MiLucJu- 1Jonny RuizAllayneApolinarCaliforniKaiser Fnd Hosp - South Sac62r424617-847-656(5173Z431-11JXMarEphriam KnuLeKentuckyiEdmoniaDD54eoWestern Pennsylvania Hospitaln49Kentucky6Outpatient Plastic Surgery CenteDebbora PAndeAVW:[facility]80<MEASUREMENTRU#161080Ann52Mosetta PigJen57SwazRex Surgery Center Of Wakefield LLCila416JoZam45KentuckZOXWRMelrose NakayaKentuck3333244mZOX78WR'Malen GCasimiro NePremier Surgery Center LLCedlVer903-8Vassie Wilki29e MiLucJu- 1Jonny RuizAllayneApolinarCaliforniThe Long [phone]([phone] KnuLeKentuckyiEdmon11iaSurgcenter Of Greater Phoenix LLCD2861KentuckyDLouis A. Johnson Va Medical CenteDebbora PAndeAVW:Lindustries LLC Dba [address] Surge21<MEASUREMENTRU#161052Ann14Mosetta PigJen57SwazLegacy Transplant Servicesila416JoZam45KenMelrose NakayaKentuck1843244mZOXWR22'29Malen GCasimiro NeUniversity Medical Center At BrackenridgeedlVer(409)1Vassie Wilki83e MiLucJu- 1Jonny RuizAllayneApolinarCaliforniCoordinated Health Orthopedic [phone] KnuLeig666K[place]ucky EdmoniaDDeon PillP25roThe Un<MEASUREMENTRU#16101Ann3Mosetta PigJen57SwazOswego Hospital - Alvin L Krakau Comm Mtl Health Center Divila416JoZam45KentuckZMelrose NakayaKentuck4663244mZOXWR34'29Malen GCasimiro NeGrover C Dils Medical CenteredlVer(669) 0Vassie Wilki77e MiLucJu- 1Jonny RuizAllayneApolinarCaliforniTulsa Er & H96o475820 094 94(15Z(216)29JXMarEphriam KnKentuckyu55EdSurgical Hospital At Southwoodsm3161Du567keKentucky Mid State Endoscopy CenteDebbora PAndeAVW:[facility]67<MEASUREMENTRU#161057Ann21Mosetta PigJen57SwazBrand Tarzana Surgical Institute Incila416JoZam45KentuckZOXWRil8988315LMelrose NakayaKentuck1633244mZOXWR46'29Malen GCasimiro NeDesert Parkway Behavioral Healthcare Hospital, LLCedlVer(660)7Vassie Wilki26e MiLucJu- 1Jonny RuizAllayneApolinarCaliforniGastroenterology Associa80t437567-666-1017862Z508-12JXMarEphriam KnuLeigKentucky4E47dmOklahoma Heart Hospitalo8961Du2125ke CoKentuckymAmbulatory Center For Endoscopy LLDebbora PAndeAVW:[facility] Dba [place] Surge95<MEASUREMENTRU#161077Ann69Mosetta PigJen57SwazCascade Medical Centerila416JoZam45KentucMelrose NakayaKentuck3213244mZOXWR36'29Malen GCasimiro NeDecatur Heiser Hospital - Decatur CampusedlVer859-0Vassie Wilki74e MiLucJu- 1Jonny RuizAllayneApolinarCaliforniOhiohealth Rehabilitation H79o415703-185-1854Z305-54JXMarEphriam KnuLKentuckyeEdmoniaDDeon62 PMercy Regional Medical KentuckyeEaton Rapids Medical CenteDebbora PAndeAVW:[facility] C57<MEASUREMENTRU#161046Ann102Mosetta PigJen57SwazDallas Endoscopy Center Ltdila416JoZam45KentMelrose NakayaKentuck143244mZOXWR'29Mor13risMalen GCasimiro NeMunising Memorial HospitaledlVer463-5Vassie Wilki55e MiLucJu- 1Jonny RuizAllayneApolinarCaliforniSharon [phone] KnuLeKentuckyiEdmoniaDDeon Pil51lNSumner Regional Medical Centero5261KentuckyDEncompass Health Rehabilitation HospitaDebbora PAndeAVW:[facility] Le80<MEASUREMENTRU#161030Ann59Mosetta PigJen57SwazEncompass Health Rehabilitation Hospital The Vintageila416JoZam4Melrose NakayaKentuck3413244mZOX64WR'Malen GCasimiro NeBloomfield Surgi Center LLC Dba Ambulatory Center Of Excellence In SurgeryedlVer801-1Vassie Wilki49e MiLucJu- 1Jonny RuizAllayneApolinarCaliforniGeisinger -Lewistown [phone] 73JXMarEphriam KnuLeKentuc32kyElliot 1 Day Surgery CeKentuckynUpper Cumberland Physicians Surgery Center LLDebbora PAndeAVW:[facility] [place] [facility](440<MEASUREMENTRU#161090Ann71Mosetta PigJen57SwazDavis Regional MedicMelrose NakayaKentuck4553244mZOXW29R'2Malen GCasimiro NeValley Laser And Surgery Center IncedlVer(628)1Vassie Wilki24e MiLucJu- 1Jonny RuizAllayneApolinarCaliforniA M Surgery26 [phone]Ephriam KnuKentuc62kyAlta Bates Summit Med Ctr-Summit Campus-HawthKentuckyoSouth Sankertown Endoscopy CenteDebbora PAndeAVW:Guthrie Towanda MemMelrose NakayaKentuck81ymNadin604Malen GauzVeryl SVassie LolLucJu- 1Jonny RuizAllayne [facility] Gi [phone] KnuLeiKentuckyg9WDDeon PillinEuHyacinth MTimot(775) [place]7409Debbora PrFrio Regional<MEASUREMENTRU#161022Ann52Mosetta PigJen57SwazMercy St Theresa Centerila41Melrose NakayaKentuck6523244mZOXWR'29Westl54akeMalen GCasimiro NeLouis A. Johnson Va Medical CenteredlVer(667)5Vassie Wilki46e MiLucJu- 1Jonny RuizAllayneApolinarCaliforniNaval Hospital Camp [phone] KnuKentuc50kyTaylor Regional HoKentuckysStraith Hospital For Special SurgerDebbora PAndeAVW:[facility] 43<MEASUREMENTRU#161022Ann29Mosetta PigJen57SwazBaycare Alliant Hospitalila416JoZam45KentuckZOXWRilMelrose NakayaKentuck793244mZOX63WR'Malen GCasimiro NeParkland Medical CenteredlVer402-4Vassie Wilki63e MiLucJu- 1Jonny RuizAllayneApolinarCaliforniClarke County Public H68o476202 854 74174Z(669)65JXMarEphriam KnuLeig8Kentuc66kyBanner Estrella Medical Center261DKentuckyuLandmark Hospital Of Athens, LLDebbora PAndeAVW:[facility] Surgery C726 490 50157sitan Bay Medical Center - Perth Amboy6962Boneta LucMorrie38 SSherrBJY:7828Z21 >eRjJabDBcgyeEbMCHvrpr$  acetaminophen administered as ordered by on-call NP.  Will continue to monitor.

## 2014-08-28 DIAGNOSIS — A419 Sepsis, unspecified organism: Secondary | ICD-10-CM

## 2014-08-28 DIAGNOSIS — E43 Unspecified severe protein-calorie malnutrition: Secondary | ICD-10-CM

## 2014-08-28 DIAGNOSIS — N390 Urinary tract infection, site not specified: Secondary | ICD-10-CM

## 2014-08-28 DIAGNOSIS — I059 Rheumatic mitral valve disease, unspecified: Secondary | ICD-10-CM

## 2014-08-28 DIAGNOSIS — A499 Bacterial infection, unspecified: Secondary | ICD-10-CM

## 2014-08-28 LAB — CBC
HEMATOCRIT: 25 % — AB (ref 39.0–52.0)
Hemoglobin: 8.5 g/dL — ABNORMAL LOW (ref 13.0–17.0)
MCH: 30.2 pg (ref 26.0–34.0)
MCHC: 34 g/dL (ref 30.0–36.0)
MCV: 89 fL (ref 78.0–100.0)
Platelets: 202 10*3/uL (ref 150–400)
RBC: 2.81 MIL/uL — ABNORMAL LOW (ref 4.22–5.81)
RDW: 15.6 % — AB (ref 11.5–15.5)
WBC: 19.2 10*3/uL — ABNORMAL HIGH (ref 4.0–10.5)

## 2014-08-28 MED ORDER — VANCOMYCIN HCL IN DEXTROSE 1-5 GM/200ML-% IV SOLN
1000.0000 mg | Freq: Once | INTRAVENOUS | Status: AC
  Administered 2014-08-28: 1000 mg via INTRAVENOUS
  Filled 2014-08-28: qty 200

## 2014-08-28 MED ORDER — PANTOPRAZOLE SODIUM 40 MG IV SOLR
40.0000 mg | INTRAVENOUS | Status: DC
  Administered 2014-08-28 – 2014-09-01 (×5): 40 mg via INTRAVENOUS
  Filled 2014-08-28 (×5): qty 40

## 2014-08-28 MED ORDER — VANCOMYCIN HCL 500 MG IV SOLR
500.0000 mg | Freq: Two times a day (BID) | INTRAVENOUS | Status: DC
  Administered 2014-08-28 – 2014-08-31 (×6): 500 mg via INTRAVENOUS
  Filled 2014-08-28 (×8): qty 500

## 2014-08-28 NOTE — Progress Notes (Signed)
[facility] PROGRESS NOTE  Joel Nunneryeter A Bechtel ZOX:[id number]:[id number] DOB: 11/18/1927 DOA: 08/22/2014 PCP: Georgann HousekeeperHUSAIN,KARRAR, MD  Assessment/Plan: 1. Acute blood loss anemia 1. Pt had required multiple units of PRBC transfusions with hgb continuing to trend down 2. GI had been following 3. Pt remains off anticoagulant per GI recs 4. Poor candidate for endoscopy given chronic medial issues 2. Afib 1. Off anticoagulant per above 2. Rate controlled currently 3. Hypernatremia 1. Levels normalized 2. Monitor 4. HTN 1. BP stable and controlled 5. Stage 3 sacral decub ulcers 1. Stable 6. Hx CVA 1. Stable. Pt essentially bedbound 7. UTI with sepsis 1. Fevers, leukocytosis, and ecoli on urine cx. 2. On rocephin 8. DVT prophylaxis 1. SCD's 9. 2/2 Gram pos bacteremia 1. On empiric vanc 2. Awaiting speciation and sensitivities 3. Pending results, would consult ID  Code Status: DNR Family Communication: Pt in room, daughter at bedside Disposition Plan: Pending  Consultants:  GI  Procedures:    Antibiotics:    HPI/Subjective: More awake today, no acute events noted  Objective: Filed Vitals:   08/28/14 0553 08/28/14 0834 08/28/14 1125 08/28/14 1200  BP: 124/65 130/70 155/67 145/66  Pulse: 93 92  90  Temp: 98 F (36.7 C) 98.9 F (37.2 C)  98 F (36.7 C)  TempSrc: Axillary Oral  Oral  Resp: 18 18  18   Height:      Weight: 59.421 kg (131 lb)     SpO2: 95% 96%  96%    Intake/Output Summary (Last 24 hours) at 08/28/14 1714 Last data filed at 08/28/14 1634  Gross per 24 hour  Intake 1622.5 ml  Output    695 ml  Net  927.5 ml   Filed Weights   08/26/14 0511 08/27/14 0651 08/28/14 0553  Weight: 59.512 kg (131 lb 3.2 oz) 59.489 kg (131 lb 2.4 oz) 59.421 kg (131 lb)    Exam:   General:  Awake, tired-appearing, in nad  Cardiovascular: regular, s1, s2  Respiratory: normal resp effort, no wheezing  Abdomen: soft,nondistended  Musculoskeletal: perfused, no clubbing    Data Reviewed: Basic Metabolic Panel:  Recent Labs Lab 08/22/14 2258 08/24/14 0310 08/24/14 0730 08/25/14 0602 08/26/14 0735 08/27/14 0530  NA 141 142  --  148* 146 147  K 3.0* 3.2*  --  3.2* 4.6 3.7  CL 103 106  --  119* 116* 114*  CO2 22 20  --  18* 20 21  GLUCOSE 119* 128*  --  84 117* 99  BUN 23 30*  --  22 20 15   CREATININE 1.16 1.31  --  0.77 0.81 0.79  CALCIUM 8.2* 8.0*  --  6.5* 8.3* 7.9*  MG  --   --  1.6  --   --   --    Liver Function Tests:  Recent Labs Lab 08/22/14 2258  AST 10  ALT <5  ALKPHOS 67  BILITOT 0.2*  PROT 5.9*  ALBUMIN 2.3*   No results for input(s): LIPASE, AMYLASE in the last 168 hours. No results for input(s): AMMONIA in the last 168 hours. CBC:  Recent Labs Lab 08/25/14 0602 08/26/14 0735 08/26/14 1318 08/27/14 0530 08/28/14 1018  WBC 8.8 12.0* 10.5 13.2* 19.2*  NEUTROABS  --   --  7.7  --   --   HGB 7.9* 9.4* 9.1* 8.6* 8.5*  HCT 23.0* 27.7* 26.9* 25.6* 25.0*  MCV 84.6 85.2 87.1 87.1 89.0  PLT 173 193 184 185 202   Cardiac Enzymes: No results for input(s): CKTOTAL, CKMB,  CKMBINDEX, TROPONINI in the last 168 hours. BNP (last 3 results) No results for input(s): PROBNP in the last 8760 hours. CBG:  Recent Labs Lab 08/26/14 0721  GLUCAP 220*    Recent Results (from the past 240 hour(s))  Urine culture     Status: None   Collection Time: 08/23/14 12:02 AM  Result Value Ref Range Status   Specimen Description URINE, RANDOM  Final   Special Requests NONE  Final   Culture  Setup Time   Final    08/23/2014 10:49 Performed at [facility] Count   Final    >=100,000 COLONIES/ML Performed at [facility]    Culture   Final    ESCHERICHIA COLI Performed at [facility]    Report Status 08/26/2014 FINAL  Final   Organism ID, Bacteria ESCHERICHIA COLI  Final      Susceptibility   Escherichia coli - MIC*    AMPICILLIN >=32 RESISTANT Resistant     CEFAZOLIN >=64 RESISTANT Resistant      CEFTRIAXONE <=1 SENSITIVE Sensitive     CIPROFLOXACIN >=4 RESISTANT Resistant     GENTAMICIN >=16 RESISTANT Resistant     LEVOFLOXACIN >=8 RESISTANT Resistant     NITROFURANTOIN <=16 SENSITIVE Sensitive     TOBRAMYCIN 8 INTERMEDIATE Intermediate     TRIMETH/SULFA <=20 SENSITIVE Sensitive     PIP/TAZO >=128 RESISTANT Resistant     * ESCHERICHIA COLI  Culture, blood (routine x 2)     Status: None (Preliminary result)   Collection Time: 08/27/14  5:10 AM  Result Value Ref Range Status   Specimen Description BLOOD RIGHT ARM  Final   Special Requests BOTTLES DRAWN AEROBIC ONLY [facility]  Final   Culture  Setup Time   Final    08/27/2014 10:21 Performed at [facility]    Culture   Final    GRAM POSITIVE COCCI IN CLUSTERS Note: Gram Stain Report Called to,Read Back By and Verified With: EDNA GARNETT  08/28/14 AT 0220 RIDK Performed at [facility]    Report Status PENDING  Incomplete  Culture, blood (routine x 2)     Status: None (Preliminary result)   Collection Time: 08/27/14  5:30 AM  Result Value Ref Range Status   Specimen Description BLOOD LEFT HAND  Final   Special Requests BOTTLES DRAWN AEROBIC ONLY 5CC  Final   Culture  Setup Time   Final    08/27/2014 10:21 Performed at [facility]    Culture   Final    GRAM POSITIVE COCCI IN CLUSTERS Note: Gram Stain Report Called to,Read Back By and Verified With: Courtney Heys @ [phone] ON [id number] BY [facility] Performed at [facility]    Report Status PENDING  Incomplete  MRSA PCR Screening     Status: None   Collection Time: 08/27/14 12:51 PM  Result Value Ref Range Status   MRSA by PCR NEGATIVE NEGATIVE Final    Comment:        The GeneXpert MRSA Assay (FDA approved for NASAL specimens only), is one component of a comprehensive MRSA colonization surveillance program. It is not intended to diagnose MRSA infection nor to guide or monitor treatment for MRSA infections.      Studies: Dg Chest Port  1 View  08/27/2014   CLINICAL DATA:  Fever  EXAM: PORTABLE CHEST - 1 VIEW  COMPARISON:  03/25/2014  FINDINGS: Study is limited by patient's rotation. No pulmonary edema. Degenerative changes thoracic  spine. Old fracture of the left fourth rib. There is left base retrocardiac streaky atelectasis or infiltrate.  IMPRESSION: No pulmonary edema. Left base retrocardiac streaky atelectasis or infiltrate.   Electronically Signed   By: Natasha MeadLiviu  Pop M.D.   On: 08/27/2014 08:21    Scheduled Meds: . amLODipine  10 mg Oral Daily  . antiseptic oral rinse  7 mL Mouth Rinse BID  . atorvastatin  40 mg Oral QHS  . atropine  2 drop Sublingual QID  . brinzolamide  1 drop Both Eyes BID  . cefTRIAXone (ROCEPHIN)  IV  1 g Intravenous Q24H  . feeding supplement (PRO-STAT SUGAR FREE 64)  30 mL Oral TID WC  . metoprolol  5 mg Intravenous 4 times per day  . pantoprazole (PROTONIX) IV  40 mg Intravenous Q24H  . sodium chloride  3 mL Intravenous Q12H  . vancomycin  500 mg Intravenous Q12H   Continuous Infusions: . sodium chloride 75 mL/hr at 08/28/14 1123    Principal Problem:   GI bleed Active Problems:   Acute encephalopathy   Atrial fibrillation   HTN (hypertension)   Dyslipidemia   Sacral decubitus ulcer, stage III   Diabetes mellitus without complication   Coagulopathy   Late effects of CVA (cerebrovascular accident)   Hypokalemia   Diverticulosis   Decubitus ulcer of heel   Blood loss anemia   Long term current use of anticoagulant therapy   Sacral decubitus ulcer   Protein-calorie malnutrition, severe   Time spent: 35min   CHIU, STEPHEN K  [facility] Pager [phone]. If 7PM-7AM, please contact night-coverage at [email or web address], password [facility] [email or web address] 08/28/2014, 5:14 PM  LOS: 6 days

## 2014-08-28 NOTE — Progress Notes (Signed)
[facility]   Hospice Home Care Chaplain Note  Patient (pt): Joel [name]  Room: 3E20  Hospice home care chaplain visited with pt to provide ongoing spiritual support.  This was chaplain's first visit with pt,  Pt appeared to be resting comfortably during visit. Pt was trying to talk with chaplain but his speech was not intelligible.  However, he seemed to be responding to chaplain's inquiry about how he was doing, and so chaplain held the space for him to speak his response.  Pt seemed to be speaking in phrases, and so chaplain would nod from time to time.  Pt clearly responded when chaplain offered prayer, punctuating the prayer with soft moans of agreement.  Chaplain left a note for pt's dtr at bedside, as pt was alone in the room.  Chaplain provided pastoral presence and prayer with blessing.  Chaplain will continue to provide ongoing spiritual support.   Beverly Isley-Landreth ThM, BCCC [facility] Clinical Chaplain

## 2014-08-28 NOTE — Progress Notes (Signed)
Inpatient RN visit-   [name] RN Visit-[name] RN  [facility].   Pt is DNR code.    Pt seen at bedside, laying on his right side, eyes open. patient attempted to speak to writer, speech garbled. Lunch tray at bedside, untouched. Per chart review pt has had little fluid intake. He now has bacteriemia and has been started on a second IV abt. He has had 2 stools charted  since 4 pm yesterday, both noted as red. Hb 8.5 today, 8.6 on 11/11. Pt has required 2 doses of IV morphine, one last evening and another at shift change this am. Daughter Joel [name] in during visit.  Pt repositioned so she could attempt to feed him. Pt in obvious pain with repositioning, Joel [name] requested pain medication, Staff RN KeKe alerted and will f/u. Pt did take a few very small bites of jello for Joel [name], he appeared to hold it and then coughed.  During visit writer and Joel [name] were able to have acomversation with Attending physician Dr.Chu, who explained the new findings of the bacteriemia and the addition of the second IV abt. He also explained the need to wait for the speciation which could give more information as to where the infection originated. Dr. Deno Etiennehu explained to Joel [name] that this could be a very serious infection and that there will be decisions regarding the type and length of treatment. He also reiterated that despite being more alert today,  Mr. Lequita HaltMorgan remains very ill.   Joel [name] voiced understanding. [facility] LCSW Erie NoeVanessa updated Patient's home medication list, transfer summary and OOF DNR in place on shadow chart.  Please call [facility] @ [phone]-  with any hospice needs.   Thank you. Hansel StarlingKaren E. Robertson, RN  [facility]  Hospice Liaison  [phone])

## 2014-08-28 NOTE — Progress Notes (Signed)
Report given to receiving RN. Patient in bed sleeping. No signs of distress noted. 

## 2014-08-28 NOTE — Progress Notes (Signed)
CRITICAL VALUE ALERT  Critical value received:  Aerobic bottle second set blood cultures positive gram + cocci in clusters  Date of notification:  08/28/14  Time of notification:  1000  Critical value read back:Yes.    Nurse who received alert:  Courtney HeysLauren Mueller, RN  MD notified (1st page):  Dr. Rhona Leavenshiu  Time of first page:  1003  MD notified (2nd page):  Time of second page:  Responding MD:  Dr. Rhona Leavenshiu  Time MD responded:  1003

## 2014-08-28 NOTE — Progress Notes (Signed)
Echocardiogram 2D Echocardiogram has been performed.  Joel [name], Joel [name] 08/28/2014, 3:12 PM

## 2014-08-28 NOTE — Progress Notes (Signed)
ANTIBIOTIC CONSULT NOTE - INITIAL  Pharmacy Consult for Vancomycin  Indication: Bacteremia  No Known Allergies  Patient Measurements: Height: 5\' 9"  (175.3 cm) Weight: 131 lb 2.4 oz (59.489 kg) IBW/kg (Calculated) : 70.7  Vital Signs: Temp: 100.8 F (38.2 C) (11/12 0106) Temp Source: Axillary (11/12 0106) BP: 112/64 mmHg (11/12 0106) Pulse Rate: 95 (11/12 0106)  Labs:  Recent Labs  08/25/14 0602 08/26/14 0735 08/26/14 1318 08/27/14 0530  WBC 8.8 12.0* 10.5 13.2*  HGB 7.9* 9.4* 9.1* 8.6*  PLT 173 193 184 185  CREATININE 0.77 0.81  --  0.79     Microbiology: Recent Results (from the past 720 hour(s))  Urine culture     Status: None   Collection Time: 08/23/14 12:02 AM  Result Value Ref Range Status   Specimen Description URINE, RANDOM  Final   Special Requests NONE  Final   Culture  Setup Time   Final    08/23/2014 10:49 Performed at [facility]    Colony Count   Final    >=100,000 COLONIES/ML Performed at [facility]    Culture   Final    ESCHERICHIA COLI Performed at [facility]    Report Status 08/26/2014 FINAL  Final   Organism ID, Bacteria ESCHERICHIA COLI  Final      Susceptibility   Escherichia coli - MIC*    AMPICILLIN >=32 RESISTANT Resistant     CEFAZOLIN >=64 RESISTANT Resistant     CEFTRIAXONE <=1 SENSITIVE Sensitive     CIPROFLOXACIN >=4 RESISTANT Resistant     GENTAMICIN >=16 RESISTANT Resistant     LEVOFLOXACIN >=8 RESISTANT Resistant     NITROFURANTOIN <=16 SENSITIVE Sensitive     TOBRAMYCIN 8 INTERMEDIATE Intermediate     TRIMETH/SULFA <=20 SENSITIVE Sensitive     PIP/TAZO >=128 RESISTANT Resistant     * ESCHERICHIA COLI  Culture, blood (routine x 2)     Status: None (Preliminary result)   Collection Time: 08/27/14  5:10 AM  Result Value Ref Range Status   Specimen Description BLOOD RIGHT ARM  Final   Special Requests BOTTLES DRAWN AEROBIC ONLY [facility]  Final   Culture  Setup Time   Final    08/27/2014  10:21 Performed at [facility]    Culture   Final    GRAM POSITIVE COCCI IN CLUSTERS Note: Gram Stain Report Called to,Read Back By and Verified With: EDNA GARNETT  08/28/14 AT 0220 RIDK Performed at [facility]    Report Status PENDING  Incomplete  MRSA PCR Screening     Status: None   Collection Time: 08/27/14 12:51 PM  Result Value Ref Range Status   MRSA by PCR NEGATIVE NEGATIVE Final    Comment:        The GeneXpert MRSA Assay (FDA approved for NASAL specimens only), is one component of a comprehensive MRSA colonization surveillance program. It is not intended to diagnose MRSA infection nor to guide or monitor treatment for MRSA infections.     Medical History: Past Medical History  Diagnosis Date  . A-fib   . Hypertension   . Rib fracture 12/29/2013    "fell in his home; due to the stroke"  . CVA (cerebral infarction)   . Encephalopathy   . Hypercholesterolemia   . Pneumonia 12/2013  . Stroke 12/29/2013    "weaker on left side since; vision, bladder, vision problems since" (02/19/2014)  . Arthritis     "knees; fingers" (02/19/2014)  . Foley catheter in  place     "came into hospital w/it" (02/19/2014)   Assessment: Adding vancomycin for blood cultures with gram + cocci in clusters, already on ceftriaxone for E Coli UTI. WBC 13.2, renal function ok for age, other labs as above.   Goal of Therapy:  Vancomycin trough level 15-20 mcg/ml  Plan:  -Vancomycin 1000 mg IV x 1, then 500 mg IV q12h -Trend WBC, temp, renal function  -F/U cultures -Drug levels as indicated   Abran DukeLedford, Chanell Nadeau 08/28/2014,3:17 AM

## 2014-08-29 DIAGNOSIS — I10 Essential (primary) hypertension: Secondary | ICD-10-CM

## 2014-08-29 LAB — BASIC METABOLIC PANEL
ANION GAP: 15 (ref 5–15)
BUN: 11 mg/dL (ref 6–23)
CO2: 20 meq/L (ref 19–32)
Calcium: 7.8 mg/dL — ABNORMAL LOW (ref 8.4–10.5)
Chloride: 108 mEq/L (ref 96–112)
Creatinine, Ser: 0.7 mg/dL (ref 0.50–1.35)
GFR calc Af Amer: >90 mL/min (ref >90)
GFR, EST NON AFRICAN AMERICAN: 84 mL/min — AB (ref >90)
Glucose, Bld: 84 mg/dL (ref 70–99)
Potassium: 3.5 mEq/L — ABNORMAL LOW (ref 3.7–5.3)
SODIUM: 143 meq/L (ref 137–147)

## 2014-08-29 LAB — CBC
HCT: 25.7 % — ABNORMAL LOW (ref 39.0–52.0)
Hemoglobin: 8.6 g/dL — ABNORMAL LOW (ref 13.0–17.0)
MCH: 29.4 pg (ref 26.0–34.0)
MCHC: 33.5 g/dL (ref 30.0–36.0)
MCV: 87.7 fL (ref 78.0–100.0)
PLATELETS: 242 10*3/uL (ref 150–400)
RBC: 2.93 MIL/uL — AB (ref 4.22–5.81)
RDW: 15.7 % — ABNORMAL HIGH (ref 11.5–15.5)
WBC: 15.3 10*3/uL — ABNORMAL HIGH (ref 4.0–10.5)

## 2014-08-29 MED ORDER — POTASSIUM CHLORIDE 20 MEQ/15ML (10%) PO SOLN
20.0000 meq | Freq: Once | ORAL | Status: AC
  Administered 2014-08-29: 20 meq via ORAL
  Filled 2014-08-29: qty 15

## 2014-08-29 NOTE — Progress Notes (Signed)
[facility] and informing writter that PMT consult had been cancelled. Patient discussed with [facility] SW Dara LordsMarta Kennedy, [facility]. [facility] on call SW will  follow up with hospital staff and family over the weekend. Per previous discussion at bedside with Dois DavenportSandra, she is expecting further discussion on plan of care/discharge by hospital physicians with her sister Rinaldo Cloudamela.  [facility] continues to follow; please contact [phone] with any hospice needs or concerns. Above information shared with CMRN Crystal.  Dayna BarkerKaren Robertson [facility] [place]([facility]), [facility] [phone]

## 2014-08-29 NOTE — Progress Notes (Signed)
Inpatient RN visit- [name] Visit-Joel Merilynn FinlandRobertson RN  [facility].  Pt is DNR  code.   Pt seen at bedside, eyes closed. Opened eyes slightly to voice, no verbalization. Patient is more lethargic.  Per chart review patient has required 2 doses of IV morphine and one dose of IV dilaudid since 4 am for management of pain.  Pt continues with very little if any po intake, 10% charted at lunch yesterday, which consisted of bites of jello. Pt remains on 2 different IV abt for treatment of both a UTI and bacteremia.  Patient's daughter Joel [name] at bedside. Writer had a long discussion regarding patient's current status, poor po intake and continued decline despite current interventions which have not returned patient to his baseline.  Attending physician Dr. Deno Etiennehu in during visit he reinforced patient's continued decline and poor prognosis. Joel [name] voiced her understanding and also stated that she felt that the pursuit of comfort was now her goal. Joel [name] is open [facility] discharge option and voiced understanding that the IV abt or further blood transfusions would not be part of that plan.  She continues to feel conflicted in conveying her decisions to her sister [name] in [place]. Joel [name] is aware that [facility] LCSW has attempted to contact Joel [name] to update her on patient's current status. Dr.Chu suggested further conversation with family by PMT. [facility] continues to assist decisions/discharge plan.  Patient's home medication list, transfer summary and OOF DNR in place on shadow chart.  Please call [facility] @ [phone] with any hospice needs.   Thank you. Hansel StarlingKaren E. Robertson, RN  [facility]  Hospice Liaison  [phone]([phone])

## 2014-08-29 NOTE — Progress Notes (Addendum)
CSW spoke with RNCM- multiple times this afternoon re: disposition of patient. [facility] and was [facility].  Per discussions with Dr Rhona Leavenshiu earlier today with patient's daughter Dois DavenportSandra, it appears that the plan of care is moving towards comfort care. CSW spoke via telephone with one of patient's daughters- Dois DavenportSandra to discuss above and she confirmed same. Wants Dr. Rhona Leavenshiu to talk to her sister who lives in [place] to make sure she is a part of the decision. With comfort care- the decision to seek residential hospice home placement and [facility].  CSW notified Forrestine Himva Davis of need for residential placement. She stated that they do not have any vacancies at this time but is aware of patient and will follow up if a vacancy occurs. CSW discussed with daughter the need for a back up plan if [facility] not [facility].  Discussed other residential hospice units to determine back up preferences. Daughter stated that she thought her father could remain in the hospital [facility]. Discussed that once medical stability was determined, that a d/c plan needed to be in place.  Daughter defers interest in any other residential hospice home. Discussed feasibility of returning home with continued hospice care and she also defers. Daughter requests, as a back up plan to residential hospice, SNF placement. Patient has [employer]- Medicare Complete and would meet SNF criteria due to wound care and possible IV antibiotics and pain control.  CSW spoke with Pollyann SavoyJody Drake- LCSW- 2nd shift who will add this information to the weekend handoff list for need to initiate SNF bed search asap.  Lorri Frederickonna T. Jaci LazierCrowder, KentuckyLCSW  [phone]

## 2014-08-29 NOTE — Plan of Care (Signed)
Problem: Phase I Progression Outcomes Goal: Voiding-avoid urinary catheter unless indicated Outcome: Not Applicable Date Met:  03/79/55 Patient has wounds and is total care and foley catheter is chronic during this admission Goal: Hemodynamically stable Outcome: Progressing  Problem: Phase II Progression Outcomes Goal: Progress activity as tolerated unless otherwise ordered Outcome: Not Progressing Patient is total care Goal: Discharge plan established Outcome: Progressing

## 2014-08-29 NOTE — Progress Notes (Signed)
[facility] PROGRESS NOTE  Joel [name] XBJ:[id number] DOB: 07/04/1928 DOA: 08/22/2014 PCP: Georgann HousekeeperHUSAIN,KARRAR, MD  Assessment/Plan: 1. Acute blood loss anemia 1. Pt had required multiple units of PRBC transfusions with hgb continuing to trend down 2. Currently hgb has remained stable 3. GI had been initially following 4. Pt remains off anticoagulant per GI recs 5. Poor candidate for endoscopy given chronic medial issues 2. Afib 1. Off anticoagulant per above 2. Rate controlled currently 3. Hypernatremia 1. Levels normalized 2. Monitor 4. HTN 1. BP stable and controlled 5. Stage 3 sacral decub ulcers 1. Stable 6. Hx CVA 1. Stable. Pt essentially bedbound 7. UTI with sepsis 1. Fevers, leukocytosis, and ecoli on urine cx. 2. On rocephin 3. Leukocytosis improving 8. DVT prophylaxis 1. SCD's 9. 2/2 Gram pos bacteremia 1. On empiric vanc 2. 2/2 coag neg staph on blood cultures  Code Status: DNR Family Communication: Pt in room, daughter at bedside Disposition Plan: Pending  Consultants:  GI  Procedures:    Antibiotics:    HPI/Subjective: Less alert today. No acute events noted  Objective: Filed Vitals:   08/29/14 0446 08/29/14 0845 08/29/14 1130 08/29/14 1500  BP:  121/74 120/55 120/70  Pulse:  79 92 80  Temp:   98.5 F (36.9 C) 98.1 F (36.7 C)  TempSrc:   Oral Oral  Resp:   18 18  Height:      Weight: 60.328 kg (133 lb)     SpO2:  92% 96% 96%    Intake/Output Summary (Last 24 hours) at 08/29/14 1640 Last data filed at 08/29/14 1500  Gross per 24 hour  Intake   1750 ml  Output    500 ml  Net   1250 ml   Filed Weights   08/27/14 0651 08/28/14 0553 08/29/14 0446  Weight: 59.489 kg (131 lb 2.4 oz) 59.421 kg (131 lb) 60.328 kg (133 lb)    Exam:   General:  Awake, tired-appearing, in nad  Cardiovascular: regular, s1, s2  Respiratory: normal resp effort, no wheezing  Abdomen: soft,nondistended  Musculoskeletal: perfused, no clubbing    Data Reviewed: Basic Metabolic Panel:  Recent Labs Lab 08/24/14 0310 08/24/14 0730 08/25/14 0602 08/26/14 0735 08/27/14 0530 08/29/14 0411  NA 142  --  148* 146 147 143  K 3.2*  --  3.2* 4.6 3.7 3.5*  CL 106  --  119* 116* 114* 108  CO2 20  --  18* 20 21 20   GLUCOSE 128*  --  84 117* 99 84  BUN 30*  --  22 20 15 11   CREATININE 1.31  --  0.77 0.81 0.79 0.70  CALCIUM 8.0*  --  6.5* 8.3* 7.9* 7.8*  MG  --  1.6  --   --   --   --    Liver Function Tests:  Recent Labs Lab 08/22/14 2258  AST 10  ALT <5  ALKPHOS 67  BILITOT 0.2*  PROT 5.9*  ALBUMIN 2.3*   No results for input(s): LIPASE, AMYLASE in the last 168 hours. No results for input(s): AMMONIA in the last 168 hours. CBC:  Recent Labs Lab 08/26/14 0735 08/26/14 1318 08/27/14 0530 08/28/14 1018 08/29/14 0411  WBC 12.0* 10.5 13.2* 19.2* 15.3*  NEUTROABS  --  7.7  --   --   --   HGB 9.4* 9.1* 8.6* 8.5* 8.6*  HCT 27.7* 26.9* 25.6* 25.0* 25.7*  MCV 85.2 87.1 87.1 89.0 87.7  PLT 193 184 185 202 242   Cardiac Enzymes: No  results for input(s): CKTOTAL, CKMB, CKMBINDEX, TROPONINI in the last 168 hours. BNP (last 3 results) No results for input(s): PROBNP in the last 8760 hours. CBG:  Recent Labs Lab 08/26/14 0721  GLUCAP 220*    Recent Results (from the past 240 hour(s))  Urine culture     Status: None   Collection Time: 08/23/14 12:02 AM  Result Value Ref Range Status   Specimen Description URINE, RANDOM  Final   Special Requests NONE  Final   Culture  Setup Time   Final    08/23/2014 10:49 Performed at [facility]    Colony Count   Final    >=100,000 COLONIES/ML Performed at [facility]    Culture   Final    ESCHERICHIA COLI Performed at [facility]    Report Status 08/26/2014 FINAL  Final   Organism ID, Bacteria ESCHERICHIA COLI  Final      Susceptibility   Escherichia coli - MIC*    AMPICILLIN >=32 RESISTANT Resistant     CEFAZOLIN >=64 RESISTANT Resistant      CEFTRIAXONE <=1 SENSITIVE Sensitive     CIPROFLOXACIN >=4 RESISTANT Resistant     GENTAMICIN >=16 RESISTANT Resistant     LEVOFLOXACIN >=8 RESISTANT Resistant     NITROFURANTOIN <=16 SENSITIVE Sensitive     TOBRAMYCIN 8 INTERMEDIATE Intermediate     TRIMETH/SULFA <=20 SENSITIVE Sensitive     PIP/TAZO >=128 RESISTANT Resistant     * ESCHERICHIA COLI  Culture, blood (routine x 2)     Status: None (Preliminary result)   Collection Time: 08/27/14  5:10 AM  Result Value Ref Range Status   Specimen Description BLOOD RIGHT ARM  Final   Special Requests BOTTLES DRAWN AEROBIC ONLY [facility]CC  Final   Culture  Setup Time   Final    08/27/2014 10:21 Performed at [facility]    Culture   Final    STAPHYLOCOCCUS SPECIES (COAGULASE NEGATIVE) Note: Gram Stain Report Called to,Read Back By and Verified With: EDNA GARNETT  08/28/14 AT 0220 RIDK Performed at [facility]    Report Status PENDING  Incomplete  Culture, blood (routine x 2)     Status: None (Preliminary result)   Collection Time: 08/27/14  5:30 AM  Result Value Ref Range Status   Specimen Description BLOOD LEFT HAND  Final   Special Requests BOTTLES DRAWN AEROBIC ONLY 5CC  Final   Culture  Setup Time   Final    08/27/2014 10:21 Performed at [facility]    Culture   Final    STAPHYLOCOCCUS SPECIES (COAGULASE NEGATIVE) Note: RIFAMPIN AND GENTAMICIN SHOULD NOT BE USED AS SINGLE DRUGS FOR TREATMENT OF STAPH INFECTIONS. Note: Gram Stain Report Called to,Read Back By and Verified With: Courtney HeysLAUREN MUELLER @ [phone] ON [phone] BY [facility] Performed at [facility]    Report Status PENDING  Incomplete  MRSA PCR Screening     Status: None   Collection Time: 08/27/14 12:51 PM  Result Value Ref Range Status   MRSA by PCR NEGATIVE NEGATIVE Final    Comment:        The GeneXpert MRSA Assay (FDA approved for NASAL specimens only), is one component of a comprehensive MRSA colonization surveillance program. It is  not intended to diagnose MRSA infection nor to guide or monitor treatment for MRSA infections.      Studies: No results found.  Scheduled Meds: . amLODipine  10 mg Oral Daily  . antiseptic oral rinse  7 mL Mouth Rinse BID  . atorvastatin  40 mg Oral QHS  . atropine  2 drop Sublingual QID  . brinzolamide  1 drop Both Eyes BID  . cefTRIAXone (ROCEPHIN)  IV  1 g Intravenous Q24H  . metoprolol  5 mg Intravenous 4 times per day  . pantoprazole (PROTONIX) IV  40 mg Intravenous Q24H  . sodium chloride  3 mL Intravenous Q12H  . vancomycin  500 mg Intravenous Q12H   Continuous Infusions: . sodium chloride 75 mL/hr at 08/29/14 1257    Principal Problem:   GI bleed Active Problems:   Acute encephalopathy   Atrial fibrillation   HTN (hypertension)   Dyslipidemia   Sacral decubitus ulcer, stage III   Diabetes mellitus without complication   Coagulopathy   Late effects of CVA (cerebrovascular accident)   Hypokalemia   Diverticulosis   Decubitus ulcer of heel   Blood loss anemia   Long term current use of anticoagulant therapy   Sacral decubitus ulcer   Protein-calorie malnutrition, severe   Time spent:   CHIU, STEPHEN K  [facility] Pager [phone]. If 7PM-7AM, please contact night-coverage at [email or web address], password [facility] 08/29/2014, 4:40 PM  LOS: 7 days

## 2014-08-29 NOTE — Progress Notes (Signed)
Report given to receiving RN. Patient in bed sleeping. No signs of distress noted. 

## 2014-08-29 NOTE — Progress Notes (Signed)
Patient turned every two hours but want to make note that when turning patient, can be very difficult to turn patient on right side due to being contracted on the left side. Patient tends to lean more towards his left and therefore pillows are the only thing allowing him to stay off his left side. Currently patient is resting after receiving IV morphine. Will continue to monitor patient to end of shift.

## 2014-08-30 DIAGNOSIS — I6990 Unspecified sequelae of unspecified cerebrovascular disease: Secondary | ICD-10-CM

## 2014-08-30 DIAGNOSIS — I480 Paroxysmal atrial fibrillation: Secondary | ICD-10-CM

## 2014-08-30 LAB — CULTURE, BLOOD (ROUTINE X 2)

## 2014-08-30 LAB — CBC
HCT: 25 % — ABNORMAL LOW (ref 39.0–52.0)
Hemoglobin: 8.3 g/dL — ABNORMAL LOW (ref 13.0–17.0)
MCH: 29.9 pg (ref 26.0–34.0)
MCHC: 33.2 g/dL (ref 30.0–36.0)
MCV: 89.9 fL (ref 78.0–100.0)
PLATELETS: 252 10*3/uL (ref 150–400)
RBC: 2.78 MIL/uL — ABNORMAL LOW (ref 4.22–5.81)
RDW: 15.7 % — ABNORMAL HIGH (ref 11.5–15.5)
WBC: 10.7 10*3/uL — AB (ref 4.0–10.5)

## 2014-08-30 NOTE — Progress Notes (Signed)
GIP visit Angie Favaeter [facility] 3E20 [facility] [place] [facility] Osborne RN  Related Admission for hospice diagnosis of CVA. Patient is a DNR. Nurse Raynelle FanningMona at bedside with CNA doing dressing changes. Per Doyne Keelhanda LSW there [facility] but family is not on the same page and need to speak with the physician. Garth BignessPat Gibbons [facility] director is aware and that [facility] not [facility]. Staff notified of plan no family present. Patient seems to be comfortable at this time.  Please call [facility] [phone] with any hospice concerns.  Thank you Ulis RiasErin Osborne RN

## 2014-08-30 NOTE — Plan of Care (Signed)
Problem: Phase III Progression Outcomes Goal: Foley discontinued Outcome: Not Progressing Client has skin issues that require foley.

## 2014-08-30 NOTE — Progress Notes (Signed)
[facility] PROGRESS NOTE  Joel [name] AVW:[id number] DOB: 07-Oct-1928 DOA: 08/22/2014 PCP: Georgann Housekeeper, MD  Assessment/Plan: 1. Acute blood loss anemia 1. Pt had required multiple units of PRBC transfusions with hgb continuing to trend down 2. Currently hgb has remained stable 3. GI had been initially following 4. Pt remains off anticoagulant per GI recs 5. Poor candidate for endoscopy given chronic medial issues. GI since signed off 2. Afib 1. Off anticoagulant per above 2. Rate controlled currently 3. Hypernatremia 1. Levels normalized 2. Monitor 4. HTN 1. BP stable and controlled 5. Stage 3 sacral decub ulcers 1. Stable 6. Hx CVA 1. Stable. Pt essentially bedbound 7. UTI with sepsis 1. Fevers, leukocytosis, and ecoli on urine cx. 2. On rocephin 3. Leukocytosis improving 8. DVT prophylaxis 1. SCD's 9. 2/2 Gram pos bacteremia 1. On empiric vanc 2. 2/2 coag neg staph on blood cultures  Code Status: DNR Family Communication: Pt in room Disposition Plan: Pending  Consultants:  GI  Procedures:    Antibiotics:    HPI/Subjective: More alert today. No acute events noted overnight.  Objective: Filed Vitals:   08/30/14 0020 08/30/14 0607 08/30/14 0700 08/30/14 0750  BP: 126/78 127/61    Pulse: 69 90    Temp:  99.1 F (37.3 C)    TempSrc:  Axillary    Resp: 18 16    Height:      Weight:   64.411 kg (142 lb) 63.504 kg (140 lb)  SpO2:  96%      Intake/Output Summary (Last 24 hours) at 08/30/14 1657 Last data filed at 08/30/14 0643  Gross per 24 hour  Intake      0 ml  Output    500 ml  Net   -500 ml   Filed Weights   08/29/14 0446 08/30/14 0700 08/30/14 0750  Weight: 60.328 kg (133 lb) 64.411 kg (142 lb) 63.504 kg (140 lb)    Exam:   General:  Awake, tired-appearing, in nad  Cardiovascular: regular, s1, s2  Respiratory: normal resp effort, no wheezing  Abdomen: soft,nondistended  Musculoskeletal: perfused, no clubbing   Data  Reviewed: Basic Metabolic Panel:  Recent Labs Lab 08/24/14 0310 08/24/14 0730 08/25/14 0602 08/26/14 0735 08/27/14 0530 08/29/14 0411  NA 142  --  148* 146 147 143  [name] 3.2*  --  3.2* 4.6 3.7 3.5*  CL 106  --  119* 116* 114* 108  CO2 20  --  18* 20 21 20   GLUCOSE 128*  --  84 117* 99 84  BUN 30*  --  22 20 15 11   CREATININE 1.31  --  0.77 0.81 0.79 0.70  CALCIUM 8.0*  --  6.5* 8.3* 7.9* 7.8*  MG  --  1.6  --   --   --   --    Liver Function Tests: No results for input(s): AST, ALT, ALKPHOS, BILITOT, PROT, ALBUMIN in the last 168 hours. No results for input(s): LIPASE, AMYLASE in the last 168 hours. No results for input(s): AMMONIA in the last 168 hours. CBC:  Recent Labs Lab 08/26/14 1318 08/27/14 0530 08/28/14 1018 08/29/14 0411 08/30/14 0523  WBC 10.5 13.2* 19.2* 15.3* 10.7*  NEUTROABS 7.7  --   --   --   --   HGB 9.1* 8.6* 8.5* 8.6* 8.3*  HCT 26.9* 25.6* 25.0* 25.7* 25.0*  MCV 87.1 87.1 89.0 87.7 89.9  PLT 184 185 202 242 252   Cardiac Enzymes: No results for input(s): CKTOTAL, CKMB, CKMBINDEX, TROPONINI in  the last 168 hours. BNP (last 3 results) No results for input(s): PROBNP in the last 8760 hours. CBG:  Recent Labs Lab 08/26/14 0721  GLUCAP 220*    Recent Results (from the past 240 hour(s))  Urine culture     Status: None   Collection Time: 08/23/14 12:02 AM  Result Value Ref Range Status   Specimen Description URINE, RANDOM  Final   Special Requests NONE  Final   Culture  Setup Time   Final    08/23/2014 10:49 Performed at [facility]    Colony Count   Final    >=100,000 COLONIES/ML Performed at [facility]    Culture   Final    ESCHERICHIA COLI Performed at [facility]    Report Status 08/26/2014 FINAL  Final   Organism ID, Bacteria ESCHERICHIA COLI  Final      Susceptibility   Escherichia coli - MIC*    AMPICILLIN >=32 RESISTANT Resistant     CEFAZOLIN >=64 RESISTANT Resistant     CEFTRIAXONE <=1  SENSITIVE Sensitive     CIPROFLOXACIN >=4 RESISTANT Resistant     GENTAMICIN >=16 RESISTANT Resistant     LEVOFLOXACIN >=8 RESISTANT Resistant     NITROFURANTOIN <=16 SENSITIVE Sensitive     TOBRAMYCIN 8 INTERMEDIATE Intermediate     TRIMETH/SULFA <=20 SENSITIVE Sensitive     PIP/TAZO >=128 RESISTANT Resistant     * ESCHERICHIA COLI  Culture, blood (routine x 2)     Status: None   Collection Time: 08/27/14  5:10 AM  Result Value Ref Range Status   Specimen Description BLOOD RIGHT ARM  Final   Special Requests BOTTLES DRAWN AEROBIC ONLY [facility]7CC  Final   Culture  Setup Time   Final    08/27/2014 10:21 Performed at [facility]    Culture   Final    STAPHYLOCOCCUS SPECIES (COAGULASE NEGATIVE) Note: SUSCEPTIBILITIES PERFORMED ON PREVIOUS CULTURE WITHIN THE LAST 5 DAYS. Note: Gram Stain Report Called to,Read Back By and Verified With: EDNA GARNETT  08/28/14 AT 0220 RIDK Performed at [facility]    Report Status 08/30/2014 FINAL  Final  Culture, blood (routine x 2)     Status: None   Collection Time: 08/27/14  5:30 AM  Result Value Ref Range Status   Specimen Description BLOOD LEFT HAND  Final   Special Requests BOTTLES DRAWN AEROBIC ONLY 5CC  Final   Culture  Setup Time   Final    08/27/2014 10:21 Performed at [facility]    Culture   Final    STAPHYLOCOCCUS SPECIES (COAGULASE NEGATIVE) Note: RIFAMPIN AND GENTAMICIN SHOULD NOT BE USED AS SINGLE DRUGS FOR TREATMENT OF STAPH INFECTIONS. Note: Gram Stain Report Called to,Read Back By and Verified With: Courtney HeysLAUREN MUELLER @ [phone] ON [phone] BY [facility] Performed at [facility]    Report Status 08/30/2014 FINAL  Final   Organism ID, Bacteria STAPHYLOCOCCUS SPECIES (COAGULASE NEGATIVE)  Final      Susceptibility   Staphylococcus species (coagulase negative) - MIC*    CLINDAMYCIN <=0.25 SENSITIVE Sensitive     ERYTHROMYCIN 0.5 SENSITIVE Sensitive     GENTAMICIN <=0.5 SENSITIVE Sensitive     LEVOFLOXACIN 4  INTERMEDIATE Intermediate     OXACILLIN <=0.25 SENSITIVE Sensitive     PENICILLIN >=0.5 RESISTANT Resistant     RIFAMPIN <=0.5 SENSITIVE Sensitive     TRIMETH/SULFA <=10 SENSITIVE Sensitive     VANCOMYCIN <=0.5 SENSITIVE Sensitive     TETRACYCLINE <=1 SENSITIVE  Sensitive     * STAPHYLOCOCCUS SPECIES (COAGULASE NEGATIVE)  MRSA PCR Screening     Status: None   Collection Time: 08/27/14 12:51 PM  Result Value Ref Range Status   MRSA by PCR NEGATIVE NEGATIVE Final    Comment:        The GeneXpert MRSA Assay (FDA approved for NASAL specimens only), is one component of a comprehensive MRSA colonization surveillance program. It is not intended to diagnose MRSA infection nor to guide or monitor treatment for MRSA infections.      Studies: No results found.  Scheduled Meds: . amLODipine  10 mg Oral Daily  . antiseptic oral rinse  7 mL Mouth Rinse BID  . atorvastatin  40 mg Oral QHS  . atropine  2 drop Sublingual QID  . brinzolamide  1 drop Both Eyes BID  . cefTRIAXone (ROCEPHIN)  IV  1 g Intravenous Q24H  . metoprolol  5 mg Intravenous 4 times per day  . pantoprazole (PROTONIX) IV  40 mg Intravenous Q24H  . sodium chloride  3 mL Intravenous Q12H  . vancomycin  500 mg Intravenous Q12H   Continuous Infusions: . sodium chloride 75 mL/hr at 08/30/14 0441    Principal Problem:   GI bleed Active Problems:   Acute encephalopathy   Atrial fibrillation   HTN (hypertension)   Dyslipidemia   Sacral decubitus ulcer, stage III   Diabetes mellitus without complication   Coagulopathy   Late effects of CVA (cerebrovascular accident)   Hypokalemia   Diverticulosis   Decubitus ulcer of heel   Blood loss anemia   Long term current use of anticoagulant therapy   Sacral decubitus ulcer   Protein-calorie malnutrition, severe   Time spent: 35min   Joel [name]  [facility] Pager [phone]. If 7PM-7AM, please contact night-coverage at [email or web address], password  [email or web address] 08/30/2014, 4:57 PM  LOS: 8 days

## 2014-08-30 NOTE — Progress Notes (Signed)
Patient repositioned and is resting comfortably at present time. Will continue to monitor.  Sharlene Doryanya Raetta Agostinelli, RN

## 2014-08-30 NOTE — Progress Notes (Signed)
All wound changed this morning. Right hip wound Stage IV - no drainage very small amount of yellow mucousy substance noted but still is red with good circulation noted. No odor noted. Undermining present. After cleansing wound, wet to dry dressing applied as ordered, secured with Medipore tape and is now clean, dry and intact.  Sacral Wound Stage III - No drainage present, wound is red with good circulation. Very mild odor present after taking old dressing off but none thereafter. After cleaning wound, wet to dry dressing applied as ordered secured with Medipore tape and is now clean, dry and intact.  Right Heel wound Stage II- Very small amount of yellow mucousy drainaged noted with very mild odor after removing old dressing but none thereafter after cleansing wound. Estimated 50% ciruculation noted. Wet to dry applied as ordered and secured with Medipore tape and now clean, dry and intact.  Wound on left side of penis - old dressing removed. Cleansed with normal saline. Applied Xeroform gauze as ordered. Covered with dry gauze and secured with a piece of Medipore tape with tape barely touching skin. Peri-care also performed at that time.   Patient was given pain medication prior to changing wounds. After wound care dressings changes, patient is now resting quietly at this time. Will continue to monitor patient to end of shift.

## 2014-08-30 NOTE — Plan of Care (Signed)
Problem: Phase I Progression Outcomes Goal: Hemodynamically stable Outcome: Progressing  Problem: Phase II Progression Outcomes Goal: IV changed to normal saline lock Outcome: Progressing Goal: Obtain order to discontinue catheter if appropriate Outcome: Not Met (add Reason) Patient has multiple wounds  Problem: Phase II Progression Outcomes Goal: No active bleeding Outcome: Progressing Goal: Hemodynamically stable Outcome: Progressing

## 2014-08-31 MED ORDER — CEFTRIAXONE SODIUM IN DEXTROSE 40 MG/ML IV SOLN
2.0000 g | INTRAVENOUS | Status: DC
  Administered 2014-09-01: 2 g via INTRAVENOUS
  Filled 2014-08-31: qty 50

## 2014-08-31 MED ORDER — CEFTRIAXONE SODIUM IN DEXTROSE 20 MG/ML IV SOLN
1.0000 g | INTRAVENOUS | Status: AC
  Administered 2014-08-31: 1 g via INTRAVENOUS
  Filled 2014-08-31: qty 50

## 2014-08-31 NOTE — Progress Notes (Signed)
[facility] PROGRESS NOTE  Joel [name] ZOX:[id number] DOB: 01/05/1928 DOA: 08/22/2014 PCP: Georgann HousekeeperHUSAIN,KARRAR, MD  Assessment/Plan: 1. Acute blood loss anemia 1. Pt had required multiple units of PRBC transfusions with hgb continuing to trend down 2. Currently hgb has remained stable 3. GI had been initially following 4. Pt remains off anticoagulant per GI recs 5. Poor candidate for endoscopy given chronic medial issues. GI since signed off 2. Afib 1. Off anticoagulant per above 2. Rate controlled currently 3. Hypernatremia 1. Levels normalized 2. Monitor 4. HTN 1. BP stable and controlled 5. Stage 3 sacral decub ulcers 1. Stable 6. Hx CVA 1. Stable. Pt essentially bedbound 7. UTI with sepsis 1. Pt initially with fevers, leukocytosis, and ecoli on urine cx. 2. Was cont on rocephin 3. Leukocytosis improving 8. DVT prophylaxis 1. SCD's 9. 2/2 Gram pos bacteremia 1. On empiric vanc 2. 2/2 coag neg staph on blood cultures 3. Seems to be improving as pt appears more alert today  Code Status: DNR Family Communication: Pt in room Disposition Plan: Pending  Consultants:  GI  Procedures:    Antibiotics:    HPI/Subjective: Pt is more alert today.  Objective: Filed Vitals:   08/30/14 0750 08/30/14 2114 08/31/14 0402 08/31/14 1414  BP:  132/73 137/60 131/78  Pulse:  93 92 81  Temp:  98.3 F (36.8 C) 97.2 F (36.2 C) 97.5 F (36.4 C)  TempSrc:  Axillary Axillary Oral  Resp:  17 18 20   Height:      Weight: 63.504 kg (140 lb)  63.4 kg (139 lb 12.4 oz)   SpO2:  94% 94% 95%    Intake/Output Summary (Last 24 hours) at 08/31/14 1516 Last data filed at 08/30/14 2119  Gross per 24 hour  Intake   1300 ml  Output    825 ml  Net    475 ml   Filed Weights   08/30/14 0700 08/30/14 0750 08/31/14 0402  Weight: 64.411 kg (142 lb) 63.504 kg (140 lb) 63.4 kg (139 lb 12.4 oz)    Exam:   General:  Awake, in nad  Cardiovascular: regular, s1, s2  Respiratory:  normal resp effort, no wheezing  Abdomen: soft,nondistended  Musculoskeletal: perfused, no clubbing   Data Reviewed: Basic Metabolic Panel:  Recent Labs Lab 08/25/14 0602 08/26/14 0735 08/27/14 0530 08/29/14 0411  NA 148* 146 147 143  K 3.2* 4.6 3.7 3.5*  CL 119* 116* 114* 108  CO2 18* 20 21 20   GLUCOSE 84 117* 99 84  BUN 22 20 15 11   CREATININE 0.77 0.81 0.79 0.70  CALCIUM 6.5* 8.3* 7.9* 7.8*   Liver Function Tests: No results for input(s): AST, ALT, ALKPHOS, BILITOT, PROT, ALBUMIN in the last 168 hours. No results for input(s): LIPASE, AMYLASE in the last 168 hours. No results for input(s): AMMONIA in the last 168 hours. CBC:  Recent Labs Lab 08/26/14 1318 08/27/14 0530 08/28/14 1018 08/29/14 0411 08/30/14 0523  WBC 10.5 13.2* 19.2* 15.3* 10.7*  NEUTROABS 7.7  --   --   --   --   HGB 9.1* 8.6* 8.5* 8.6* 8.3*  HCT 26.9* 25.6* 25.0* 25.7* 25.0*  MCV 87.1 87.1 89.0 87.7 89.9  PLT 184 185 202 242 252   Cardiac Enzymes: No results for input(s): CKTOTAL, CKMB, CKMBINDEX, TROPONINI in the last 168 hours. BNP (last 3 results) No results for input(s): PROBNP in the last 8760 hours. CBG:  Recent Labs Lab 08/26/14 0721  GLUCAP 220*    Recent Results (from  the past 240 hour(s))  Urine culture     Status: None   Collection Time: 08/23/14 12:02 AM  Result Value Ref Range Status   Specimen Description URINE, RANDOM  Final   Special Requests NONE  Final   Culture  Setup Time   Final    08/23/2014 10:49 Performed at [facility]    Colony Count   Final    >=100,000 COLONIES/ML Performed at [facility]    Culture   Final    ESCHERICHIA COLI Performed at [facility]    Report Status 08/26/2014 FINAL  Final   Organism ID, Bacteria ESCHERICHIA COLI  Final      Susceptibility   Escherichia coli - MIC*    AMPICILLIN >=32 RESISTANT Resistant     CEFAZOLIN >=64 RESISTANT Resistant     CEFTRIAXONE <=1 SENSITIVE Sensitive      CIPROFLOXACIN >=4 RESISTANT Resistant     GENTAMICIN >=16 RESISTANT Resistant     LEVOFLOXACIN >=8 RESISTANT Resistant     NITROFURANTOIN <=16 SENSITIVE Sensitive     TOBRAMYCIN 8 INTERMEDIATE Intermediate     TRIMETH/SULFA <=20 SENSITIVE Sensitive     PIP/TAZO >=128 RESISTANT Resistant     * ESCHERICHIA COLI  Culture, blood (routine x 2)     Status: None   Collection Time: 08/27/14  5:10 AM  Result Value Ref Range Status   Specimen Description BLOOD RIGHT ARM  Final   Special Requests BOTTLES DRAWN AEROBIC ONLY [facility]7CC  Final   Culture  Setup Time   Final    08/27/2014 10:21 Performed at [facility]    Culture   Final    STAPHYLOCOCCUS SPECIES (COAGULASE NEGATIVE) Note: SUSCEPTIBILITIES PERFORMED ON PREVIOUS CULTURE WITHIN THE LAST 5 DAYS. Note: Gram Stain Report Called to,Read Back By and Verified With: EDNA GARNETT  08/28/14 AT 0220 RIDK Performed at [facility]    Report Status 08/30/2014 FINAL  Final  Culture, blood (routine x 2)     Status: None   Collection Time: 08/27/14  5:30 AM  Result Value Ref Range Status   Specimen Description BLOOD LEFT HAND  Final   Special Requests BOTTLES DRAWN AEROBIC ONLY 5CC  Final   Culture  Setup Time   Final    08/27/2014 10:21 Performed at [facility]    Culture   Final    STAPHYLOCOCCUS SPECIES (COAGULASE NEGATIVE) Note: RIFAMPIN AND GENTAMICIN SHOULD NOT BE USED AS SINGLE DRUGS FOR TREATMENT OF STAPH INFECTIONS. Note: Gram Stain Report Called to,Read Back By and Verified With: Courtney HeysLAUREN MUELLER @ [phone] ON [phone] BY [facility] Performed at [facility]    Report Status 08/30/2014 FINAL  Final   Organism ID, Bacteria STAPHYLOCOCCUS SPECIES (COAGULASE NEGATIVE)  Final      Susceptibility   Staphylococcus species (coagulase negative) - MIC*    CLINDAMYCIN <=0.25 SENSITIVE Sensitive     ERYTHROMYCIN 0.5 SENSITIVE Sensitive     GENTAMICIN <=0.5 SENSITIVE Sensitive     LEVOFLOXACIN 4 INTERMEDIATE Intermediate      OXACILLIN <=0.25 SENSITIVE Sensitive     PENICILLIN >=0.5 RESISTANT Resistant     RIFAMPIN <=0.5 SENSITIVE Sensitive     TRIMETH/SULFA <=10 SENSITIVE Sensitive     VANCOMYCIN <=0.5 SENSITIVE Sensitive     TETRACYCLINE <=1 SENSITIVE Sensitive     * STAPHYLOCOCCUS SPECIES (COAGULASE NEGATIVE)  MRSA PCR Screening     Status: None   Collection Time: 08/27/14 12:51 PM  Result Value Ref Range Status  MRSA by PCR NEGATIVE NEGATIVE Final    Comment:        The GeneXpert MRSA Assay (FDA approved for NASAL specimens only), is one component of a comprehensive MRSA colonization surveillance program. It is not intended to diagnose MRSA infection nor to guide or monitor treatment for MRSA infections.      Studies: No results found.  Scheduled Meds: . amLODipine  10 mg Oral Daily  . antiseptic oral rinse  7 mL Mouth Rinse BID  . atorvastatin  40 mg Oral QHS  . atropine  2 drop Sublingual QID  . brinzolamide  1 drop Both Eyes BID  . cefTRIAXone (ROCEPHIN)  IV  1 g Intravenous Q24H  . metoprolol  5 mg Intravenous 4 times per day  . pantoprazole (PROTONIX) IV  40 mg Intravenous Q24H  . sodium chloride  3 mL Intravenous Q12H  . vancomycin  500 mg Intravenous Q12H   Continuous Infusions: . sodium chloride 75 mL/hr at 08/31/14 1012    Principal Problem:   GI bleed Active Problems:   Acute encephalopathy   Atrial fibrillation   HTN (hypertension)   Dyslipidemia   Sacral decubitus ulcer, stage III   Diabetes mellitus without complication   Coagulopathy   Late effects of CVA (cerebrovascular accident)   Hypokalemia   Diverticulosis   Decubitus ulcer of heel   Blood loss anemia   Long term current use of anticoagulant therapy   Sacral decubitus ulcer   Protein-calorie malnutrition, severe   Time spent:   Thekla Colborn K  [facility] Pager [phone]. If 7PM-7AM, please contact night-coverage at [email or web address], password [facility] 08/31/2014, 3:16 PM  LOS: 9 days

## 2014-08-31 NOTE — Treatment Plan (Signed)
Tried to call Dois DavenportSandra at number listed for an update. There was no answer. Will try again later

## 2014-08-31 NOTE — Clinical Social Work Note (Signed)
CSW continues to follow this patient for d/c planning needs. CSW made aware [facility], however daughter wanted to speak with MD before making decision [facility]. CSW attempted to contact patient's daughter.   CSW made aware by patient's RN Wynona Canes(Christine) patient is back at baseline per MD. CSW to follow tomorrow.  Gilbert Manolis Patrick-Jefferson, LCSWA Weekend Clinical Social Worker [phone]

## 2014-09-01 ENCOUNTER — Inpatient Hospital Stay (HOSPITAL_COMMUNITY)

## 2014-09-01 MED ORDER — SODIUM CHLORIDE 0.9 % IJ SOLN
10.0000 mL | INTRAMUSCULAR | Status: DC | PRN
  Administered 2014-09-01: 10 mL
  Filled 2014-09-01: qty 40

## 2014-09-01 MED ORDER — ONDANSETRON HCL 4 MG/2ML IJ SOLN: 4.0000 mg | Freq: Four times a day (QID) | INTRAMUSCULAR | Status: AC | PRN

## 2014-09-01 MED ORDER — ONDANSETRON HCL 4 MG PO TABS: 4.0000 mg | ORAL_TABLET | Freq: Four times a day (QID) | ORAL | Status: AC | PRN

## 2014-09-01 MED ORDER — HYDROMORPHONE HCL 1 MG/ML IJ SOLN: 0.5000 mg | INTRAMUSCULAR | Status: AC | PRN

## 2014-09-01 MED ORDER — MORPHINE SULFATE 2 MG/ML IJ SOLN: 2.0000 mg | INTRAMUSCULAR | Status: AC | PRN

## 2014-09-01 MED ORDER — ACETAMINOPHEN 650 MG RE SUPP: 650.0000 mg | Freq: Four times a day (QID) | RECTAL | Status: AC | PRN

## 2014-09-01 MED ORDER — ACETAMINOPHEN 325 MG PO TABS: 650.0000 mg | ORAL_TABLET | Freq: Four times a day (QID) | ORAL | Status: AC | PRN

## 2014-09-01 MED ORDER — HEPARIN SOD (PORK) LOCK FLUSH 100 UNIT/ML IV SOLN
250.0000 [IU] | INTRAVENOUS | Status: AC | PRN
  Administered 2014-09-01: 250 [IU]

## 2014-09-01 MED ORDER — LORAZEPAM 2 MG/ML IJ SOLN: 0.5000 mg | Freq: Four times a day (QID) | INTRAMUSCULAR | Status: AC | PRN

## 2014-09-01 NOTE — Plan of Care (Signed)
Problem: Discharge Progression Outcomes Goal: Complications resolved/controlled Outcome: Adequate for Discharge Pallative care.

## 2014-09-01 NOTE — Plan of Care (Signed)
Problem: Discharge Progression Outcomes Goal: Pain controlled with appropriate interventions Outcome: Completed/Met Date Met:  10-07-14

## 2014-09-01 NOTE — Plan of Care (Signed)
Problem: Discharge Progression Outcomes Goal: Hemodynamically stable Outcome: Not Met (add Reason) Pallative care.

## 2014-09-01 NOTE — Plan of Care (Signed)
Problem: Discharge Progression Outcomes Goal: Discharge plan in place and appropriate Outcome: Completed/Met Date Met:  09/01/14

## 2014-09-01 NOTE — Plan of Care (Signed)
Problem: Phase III Progression Outcomes Goal: Activity at appropriate level-compared to baseline (UP IN CHAIR FOR HEMODIALYSIS)  Outcome: Not Met (add Reason) Pallative care.

## 2014-09-01 NOTE — Progress Notes (Signed)
Peripherally Inserted Central Catheter/Midline Placement  The IV Nurse has discussed with the patient and/or persons authorized to consent for the patient, the purpose of this procedure and the potential benefits and risks involved with this procedure.  The benefits include less needle sticks, lab draws from the catheter and patient may be discharged home with the catheter.  Risks include, but not limited to, infection, bleeding, blood clot (thrombus formation), and puncture of an artery; nerve damage and irregular heat beat.  Alternatives to this procedure were also discussed.  PICC/Midline Placement Documentation  PICC / Midline Single Lumen 09/01/14 PICC Right Basilic 43 cm 0 cm (Active)  Indication for Insertion or Continuance of Line Poor Vasculature-patient has had multiple peripheral attempts or PIVs lasting less than 24 hours 09/01/2014 11:00 AM  Exposed Catheter (cm) 0 cm 09/01/2014 11:00 AM  Dressing Change Due 09/08/14 09/01/2014 11:00 AM       Stacie GlazeJoyce, Eisley Barber Horton 09/01/2014, 11:29 AM

## 2014-09-01 NOTE — Plan of Care (Signed)
Problem: Discharge Progression Outcomes Goal: Hemodynamically stable Outcome: Adequate for Discharge Pallative care.

## 2014-09-01 NOTE — Progress Notes (Signed)
Inpatient RN visit- [facility]ie [facility]  -[facility] RN Visit-[name] RN  [facility].  Pt is DNR code.     Pt seen at bedside eyes closed, opened eyes slightly to voice, no verbal response noted. Per chart review patient continues to require IV hydromorphone for pain management and IV abt therapy. He continues with poor po intake; 0-10% of meals. Pt had a PICC line placed this am in anticipation of discharge with continuation of IV Abt. Daughter Dois DavenportSandra and [facility] LCSW Erie NoeVanessa at bedside. A lengthy discussion was had regarding discharge disposition/options. LCSW Erie NoeVanessa was able to have a detailed phone conversation with patient's daughter [place]ita [place] in [place], and the two sisters decided that the best choice for their father was to pursue comfort [facility]. They request no further abt. Pam was reassured that her father would receive comfort medications. Above information shared with Attending Dr. Deno Etiennehu, [facility] Crystal and CSW Lupita LeashDonna.  Nurse to call [facility] to [facility] at [phone]. Please contact patient's daughter Dois DavenportSandra @ [phone] when transport is on the unit so she can [facility].  Patient's home medication list, transfer summary and OOF DNR in place on shadow chart.  Please call [facility] @ [phone]-  with any hospice needs.   Thank you. Hansel StarlingKaren E. Robertson, RN  [facility]  Hospice Liaison  [phone])

## 2014-09-01 NOTE — Discharge Summary (Signed)
Physician Discharge Summary  Rozetta Nunneryeter A Luhn ZOX:[id number] DOB: 05/23/1928 DOA: 08/22/2014  PCP: Georgann HousekeeperHUSAIN,KARRAR, MD  Admit date: 08/22/2014 Discharge date: 09/01/2014  Time spent: 35 minutes  Recommendations for Outpatient Follow-up:  1. Follow up on as needed basis 2. PICC care per hospice. OK to use PICC for as needed IV medications  Discharge Diagnoses:  Principal Problem:   GI bleed Active Problems:   Acute encephalopathy   Atrial fibrillation   HTN (hypertension)   Dyslipidemia   Sacral decubitus ulcer, stage III   Diabetes mellitus without complication   Coagulopathy   Late effects of CVA (cerebrovascular accident)   Hypokalemia   Diverticulosis   Decubitus ulcer of heel   Blood loss anemia   Long term current use of anticoagulant therapy   Sacral decubitus ulcer   Protein-calorie malnutrition, severe   Discharge Condition: Stable  Diet recommendation: Comfort feeds  Filed Weights   08/30/14 0750 08/31/14 0402 09/01/14 0706  Weight: 63.504 kg (140 lb) 63.4 kg (139 lb 12.4 oz) 62.3 kg (137 lb 5.6 oz)    History of present illness:  Please see admit h and p from 11/7 for details. Briefly, pt presented initially with bloody stools in the setting of chronic anticoagulation. The patient was admitted for further work up.  Hospital Course: 1. Acute blood loss anemia 1. Pt had required multiple units of PRBC transfusions with hgb continuing to trend down 2. Currently hgb has remained stable 3. GI had been initially following 4. Pt remains off anticoagulant per GI recs  5. Poor candidate for endoscopy given chronic medial issues. GI since signed off 2. Afib 1. Off anticoagulant per above 2. Rate controlled  3. Hypernatremia 1. Levels normalized 4. HTN 1. BP remained stable 5. Stage 3 sacral decub ulcers 1. Stable 6. Hx CVA 1. Stable. Pt essentially bedbound 7. UTI with sepsis 1. Pt initially with fevers, leukocytosis, and ecoli on urine cx. 2. Was cont on  rocephin 3. Leukocytosis improved but mentation continued to be very poor, preventing pt to tolerate any kind of PO nutrition. Pt's wishes are for no PEG tube per POA at bedside. Discussed with Nutrition, with no further recs. Pt is a very poor candidate for peripheral nutrition given concerns of bacteremia.  8. DVT prophylaxis 1. SCD's while inpatient 9. 2/2 Gram pos bacteremia 1. On empiric vanc 2. 2/2 coag neg staph on blood cultures 3. Alertness improved but mentation is still quite poor and pt is far off from baseline. He is unable to eat 10. End of Life 1. Discussed case with pt's POA at bedside. Pt's case is a very unfortunate one. Overall, his course was complicated by sepsis with ecoli uti as well as coag neg staph bacteremia. The course had been well-discussed with the POA and ultimately, the decision was made for comfort measures only, especially as pt is unable to tolerate any kind of PO intake given his markedly worsened mentation. [facility].  Procedures: PICC 11/16  Consultations:  GI  Discharge Exam: Filed Vitals:   08/31/14 1414 08/31/14 2024 09/01/14 0706 09/01/14 0909  BP: 131/78 150/56 151/60 123/97  Pulse: 81 80 87   Temp: 97.5 F (36.4 C) 98.2 F (36.8 C) 98.6 F (37 C)   TempSrc: Oral Oral Oral   Resp: 20 18 21    Height:      Weight:   62.3 kg (137 lb 5.6 oz)   SpO2: 95%  100%  General: arousable, in nad Cardiovascular: regular, s1, s2 Respiratory: normal resp effort, no wheezing  Discharge Instructions     Medication List    STOP taking these medications        amLODipine 10 MG tablet  Commonly known as:  NORVASC     apixaban 5 MG Tabs tablet  Commonly known as:  ELIQUIS     atorvastatin 40 MG tablet  Commonly known as:  LIPITOR     brinzolamide 1 % ophthalmic suspension  Commonly known as:  AZOPT     carvedilol 12.5 MG tablet  Commonly known as:  COREG     cephALEXin 500 MG  capsule  Commonly known as:  KEFLEX     ibuprofen 600 MG tablet  Commonly known as:  ADVIL,MOTRIN     LORazepam 0.5 MG tablet  Commonly known as:  ATIVAN  Replaced by:  LORazepam 2 MG/ML injection     mupirocin ointment 2 %  Commonly known as:  BACTROBAN     oxyCODONE-acetaminophen 5-325 MG per tablet  Commonly known as:  PERCOCET/ROXICET      TAKE these medications        acetaminophen 650 MG suppository  Commonly known as:  TYLENOL  Place 1 suppository (650 mg total) rectally every 6 (six) hours as needed for mild pain (or Fever >/= 101).     acetaminophen 325 MG tablet  Commonly known as:  TYLENOL  Take 2 tablets (650 mg total) by mouth every 6 (six) hours as needed for mild pain (or Fever >/= 101).     HYDROmorphone 1 MG/ML injection  Commonly known as:  DILAUDID  Inject 0.5-1 mLs (0.5-1 mg total) into the vein every 3 (three) hours as needed for severe pain.     lidocaine 5 %  Commonly known as:  LIDODERM  Place 1 patch onto the skin daily. Remove & Discard patch within 12 hours or as directed by MD     LORazepam 2 MG/ML injection  Commonly known as:  ATIVAN  Inject 0.25 mLs (0.5 mg total) into the vein every 6 (six) hours as needed for anxiety.     morphine 2 MG/ML injection  Inject 1 mL (2 mg total) into the vein every 4 (four) hours as needed.     ondansetron 4 MG tablet  Commonly known as:  ZOFRAN  Take 1 tablet (4 mg total) by mouth every 6 (six) hours as needed for nausea.     ondansetron 4 MG/2ML Soln injection  Commonly known as:  ZOFRAN  Inject 2 mLs (4 mg total) into the vein every 6 (six) hours as needed for nausea.       No Known Allergies Follow-up Information    Follow up with Georgann Housekeeper, MD.   Specialty:  Internal Medicine   Why:  As needed   Contact information:   [address] [place] [address] [phone]        The results of significant diagnostics from this hospitalization (including imaging,  microbiology, ancillary and laboratory) are listed below for reference.    Significant Diagnostic Studies: Ct Abdomen Pelvis W Contrast  08/23/2014   CLINICAL DATA:  Rectal bleeding.  Bloody stool.  EXAM: CT ABDOMEN AND PELVIS WITH CONTRAST  TECHNIQUE: Multidetector CT imaging of the abdomen and pelvis was performed using the standard protocol following bolus administration of intravenous contrast.  CONTRAST:  OMNIPAQUE IOHEXOL 300 MG/ML  SOLN  COMPARISON:  CT 01/19/2014  FINDINGS: Lower chest: Lung  bases are relatively clear. There is mild ground-glass opacity in the left lower lobe suggesting atelectasis. No pericardial fluid.  Hepatobiliary: No focal hepatic lesion. The gallbladder is distended to 4 cm. No evidence of inflammation.  Pancreas: Pancreas is normal. No duct dilatation. No pancreatic inflammation.  Spleen: Normal spleen  Adrenals/urinary tract: Adrenal glands kidneys normal. No a ureteral abnormality. Foley catheter in the bladder.  Stomach/Bowel: Stomach, small bowel, and cecum are normal. Normal volume stool through the colon. There are diverticula of the descending colon and sigmoid colon without acute inflammation. Rectum is grossly normal.  Vascular/Lymphatic: Abdominal aorta is normal caliber. There is no retroperitoneal or periportal lymphadenopathy. No pelvic lymphadenopathy.  Reproductive: Prostate small.  Musculoskeletal: No aggressive osseous lesion. There is degenerative change at the left hip. The left femur is elevated.  Other: No free fluid or abscess.  IMPRESSION: 1. No explanation for rectal bleeding. 2. Left colon and sigmoid diverticulosis without evidence of acute diverticulitis. 3. Mild right basilar atelectasis.   Electronically Signed   By: Genevive Bi M.D.   On: 08/23/2014 01:05   Dg Chest Port 1 View  09/01/2014   CLINICAL DATA:  Catheter placement.  EXAM: PORTABLE CHEST - 1 VIEW  COMPARISON:  August 27, 2014.  FINDINGS: Stable cardiomediastinal  silhouette. No pneumothorax is noted. Right lung is clear. Stable left basilar opacity is noted concerning for pneumonia or atelectasis with associated pleural effusion. Interval placement of right-sided PICC line with distal tip at the expected position of the cavoatrial junction.  IMPRESSION: Stable left basilar opacity is noted concerning for pneumonia or atelectasis with associated pleural effusion. Interval placement of right-sided PICC line with distal tip in the expected position of the cavoatrial junction.   Electronically Signed   By: Roque Lias M.D.   On: 09/01/2014 12:04   Dg Chest Port 1 View  08/27/2014   CLINICAL DATA:  Fever  EXAM: PORTABLE CHEST - 1 VIEW  COMPARISON:  03/25/2014  FINDINGS: Study is limited by patient's rotation. No pulmonary edema. Degenerative changes thoracic spine. Old fracture of the left fourth rib. There is left base retrocardiac streaky atelectasis or infiltrate.  IMPRESSION: No pulmonary edema. Left base retrocardiac streaky atelectasis or infiltrate.   Electronically Signed   By: Natasha Mead M.D.   On: 08/27/2014 08:21    Microbiology: Recent Results (from the past 240 hour(s))  Urine culture     Status: None   Collection Time: 08/23/14 12:02 AM  Result Value Ref Range Status   Specimen Description URINE, RANDOM  Final   Special Requests NONE  Final   Culture  Setup Time   Final    08/23/2014 10:49 Performed at [facility] Count   Final    >=100,000 COLONIES/ML Performed at [facility]    Culture   Final    ESCHERICHIA COLI Performed at [facility]    Report Status 08/26/2014 FINAL  Final   Organism ID, Bacteria ESCHERICHIA COLI  Final      Susceptibility   Escherichia coli - MIC*    AMPICILLIN >=32 RESISTANT Resistant     CEFAZOLIN >=64 RESISTANT Resistant     CEFTRIAXONE <=1 SENSITIVE Sensitive     CIPROFLOXACIN >=4 RESISTANT Resistant     GENTAMICIN >=16 RESISTANT Resistant     LEVOFLOXACIN >=8  RESISTANT Resistant     NITROFURANTOIN <=16 SENSITIVE Sensitive     TOBRAMYCIN 8 INTERMEDIATE Intermediate     TRIMETH/SULFA <=20 SENSITIVE Sensitive  PIP/TAZO >=128 RESISTANT Resistant     * ESCHERICHIA COLI  Culture, blood (routine x 2)     Status: None   Collection Time: 08/27/14  5:10 AM  Result Value Ref Range Status   Specimen Description BLOOD RIGHT ARM  Final   Special Requests BOTTLES DRAWN AEROBIC ONLY [facility]  Final   Culture  Setup Time   Final    08/27/2014 10:21 Performed at [facility]    Culture   Final    STAPHYLOCOCCUS SPECIES (COAGULASE NEGATIVE) Note: SUSCEPTIBILITIES PERFORMED ON PREVIOUS CULTURE WITHIN THE LAST 5 DAYS. Note: Gram Stain Report Called to,Read Back By and Verified With: EDNA GARNETT  08/28/14 AT 0220 RIDK Performed at [facility]    Report Status 08/30/2014 FINAL  Final  Culture, blood (routine x 2)     Status: None   Collection Time: 08/27/14  5:30 AM  Result Value Ref Range Status   Specimen Description BLOOD LEFT HAND  Final   Special Requests BOTTLES DRAWN AEROBIC ONLY 5CC  Final   Culture  Setup Time   Final    08/27/2014 10:21 Performed at [facility]    Culture   Final    STAPHYLOCOCCUS SPECIES (COAGULASE NEGATIVE) Note: RIFAMPIN AND GENTAMICIN SHOULD NOT BE USED AS SINGLE DRUGS FOR TREATMENT OF STAPH INFECTIONS. Note: Gram Stain Report Called to,Read Back By and Verified With: Courtney Heys @ [phone] ON [phone] BY [facility] Performed at [facility]    Report Status 08/30/2014 FINAL  Final   Organism ID, Bacteria STAPHYLOCOCCUS SPECIES (COAGULASE NEGATIVE)  Final      Susceptibility   Staphylococcus species (coagulase negative) - MIC*    CLINDAMYCIN <=0.25 SENSITIVE Sensitive     ERYTHROMYCIN 0.5 SENSITIVE Sensitive     GENTAMICIN <=0.5 SENSITIVE Sensitive     LEVOFLOXACIN 4 INTERMEDIATE Intermediate     OXACILLIN <=0.25 SENSITIVE Sensitive     PENICILLIN >=0.5 RESISTANT Resistant     RIFAMPIN <=0.5  SENSITIVE Sensitive     TRIMETH/SULFA <=10 SENSITIVE Sensitive     VANCOMYCIN <=0.5 SENSITIVE Sensitive     TETRACYCLINE <=1 SENSITIVE Sensitive     * STAPHYLOCOCCUS SPECIES (COAGULASE NEGATIVE)  MRSA PCR Screening     Status: None   Collection Time: 08/27/14 12:51 PM  Result Value Ref Range Status   MRSA by PCR NEGATIVE NEGATIVE Final    Comment:        The GeneXpert MRSA Assay (FDA approved for NASAL specimens only), is one component of a comprehensive MRSA colonization surveillance program. It is not intended to diagnose MRSA infection nor to guide or monitor treatment for MRSA infections.   Culture, blood (routine x 2)     Status: None (Preliminary result)   Collection Time: 08/31/14  4:30 PM  Result Value Ref Range Status   Specimen Description BLOOD RIGHT ARM  Final   Special Requests BOTTLES DRAWN AEROBIC AND ANAEROBIC 10 CC  Final   Culture  Setup Time   Final    08/31/2014 23:28 Performed at [facility]    Culture   Final           BLOOD CULTURE RECEIVED NO GROWTH TO DATE CULTURE WILL BE HELD FOR 5 DAYS BEFORE ISSUING A FINAL NEGATIVE REPORT Performed at [facility]    Report Status PENDING  Incomplete  Culture, blood (routine x 2)     Status: None (Preliminary result)   Collection Time: 08/31/14  4:50 PM  Result Value Ref  Range Status   Specimen Description BLOOD LEFT ARM  Final   Special Requests BOTTLES DRAWN AEROBIC ONLY 4 CC  Final   Culture  Setup Time   Final    08/31/2014 23:28 Performed at [facility]    Culture   Final           BLOOD CULTURE RECEIVED NO GROWTH TO DATE CULTURE WILL BE HELD FOR 5 DAYS BEFORE ISSUING A FINAL NEGATIVE REPORT Performed at [facility]    Report Status PENDING  Incomplete     Labs: Basic Metabolic Panel:  Recent Labs Lab 08/26/14 0735 08/27/14 0530 08/29/14 0411  NA 146 147 143  K 4.6 3.7 3.5*  CL 116* 114* 108  CO2 20 21 20   GLUCOSE 117* 99 84  BUN 20 15 11   CREATININE  0.81 0.79 0.70  CALCIUM 8.3* 7.9* 7.8*   Liver Function Tests: No results for input(s): AST, ALT, ALKPHOS, BILITOT, PROT, ALBUMIN in the last 168 hours. No results for input(s): LIPASE, AMYLASE in the last 168 hours. No results for input(s): AMMONIA in the last 168 hours. CBC:  Recent Labs Lab 08/26/14 1318 08/27/14 0530 08/28/14 1018 08/29/14 0411 08/30/14 0523  WBC 10.5 13.2* 19.2* 15.3* 10.7*  NEUTROABS 7.7  --   --   --   --   HGB 9.1* 8.6* 8.5* 8.6* 8.3*  HCT 26.9* 25.6* 25.0* 25.7* 25.0*  MCV 87.1 87.1 89.0 87.7 89.9  PLT 184 185 202 242 252   Cardiac Enzymes: No results for input(s): CKTOTAL, CKMB, CKMBINDEX, TROPONINI in the last 168 hours. BNP: BNP (last 3 results) No results for input(s): PROBNP in the last 8760 hours. CBG:  Recent Labs Lab 08/26/14 0721  GLUCAP 220*   Signed:  Olanda Downie K  [facility] 09/01/2014, 1:55 PM

## 2014-09-01 NOTE — Plan of Care (Signed)
Problem: Discharge Progression Outcomes Goal: Stools guaiac negative Outcome: Adequate for Discharge Pallative care.

## 2014-09-01 NOTE — Progress Notes (Signed)
Patient is sleeping peacefully. Repositioned with multiple pillows for comfort. Will continue to monitor.  Sharlene Doryanya Chetan Mehring, RN

## 2014-09-01 NOTE — Plan of Care (Signed)
Problem: Discharge Progression Outcomes Goal: Tolerating diet Outcome: Completed/Met Date Met:  09/01/14     

## 2014-09-02 IMAGING — CR DG CHEST 1V PORT
1 series · 1 of 1 positions shown · non-contrast
Comparison: 01/22/2014

CLINICAL DATA: Chest pain

EXAM:
PORTABLE CHEST - 1 VIEW

[AP]
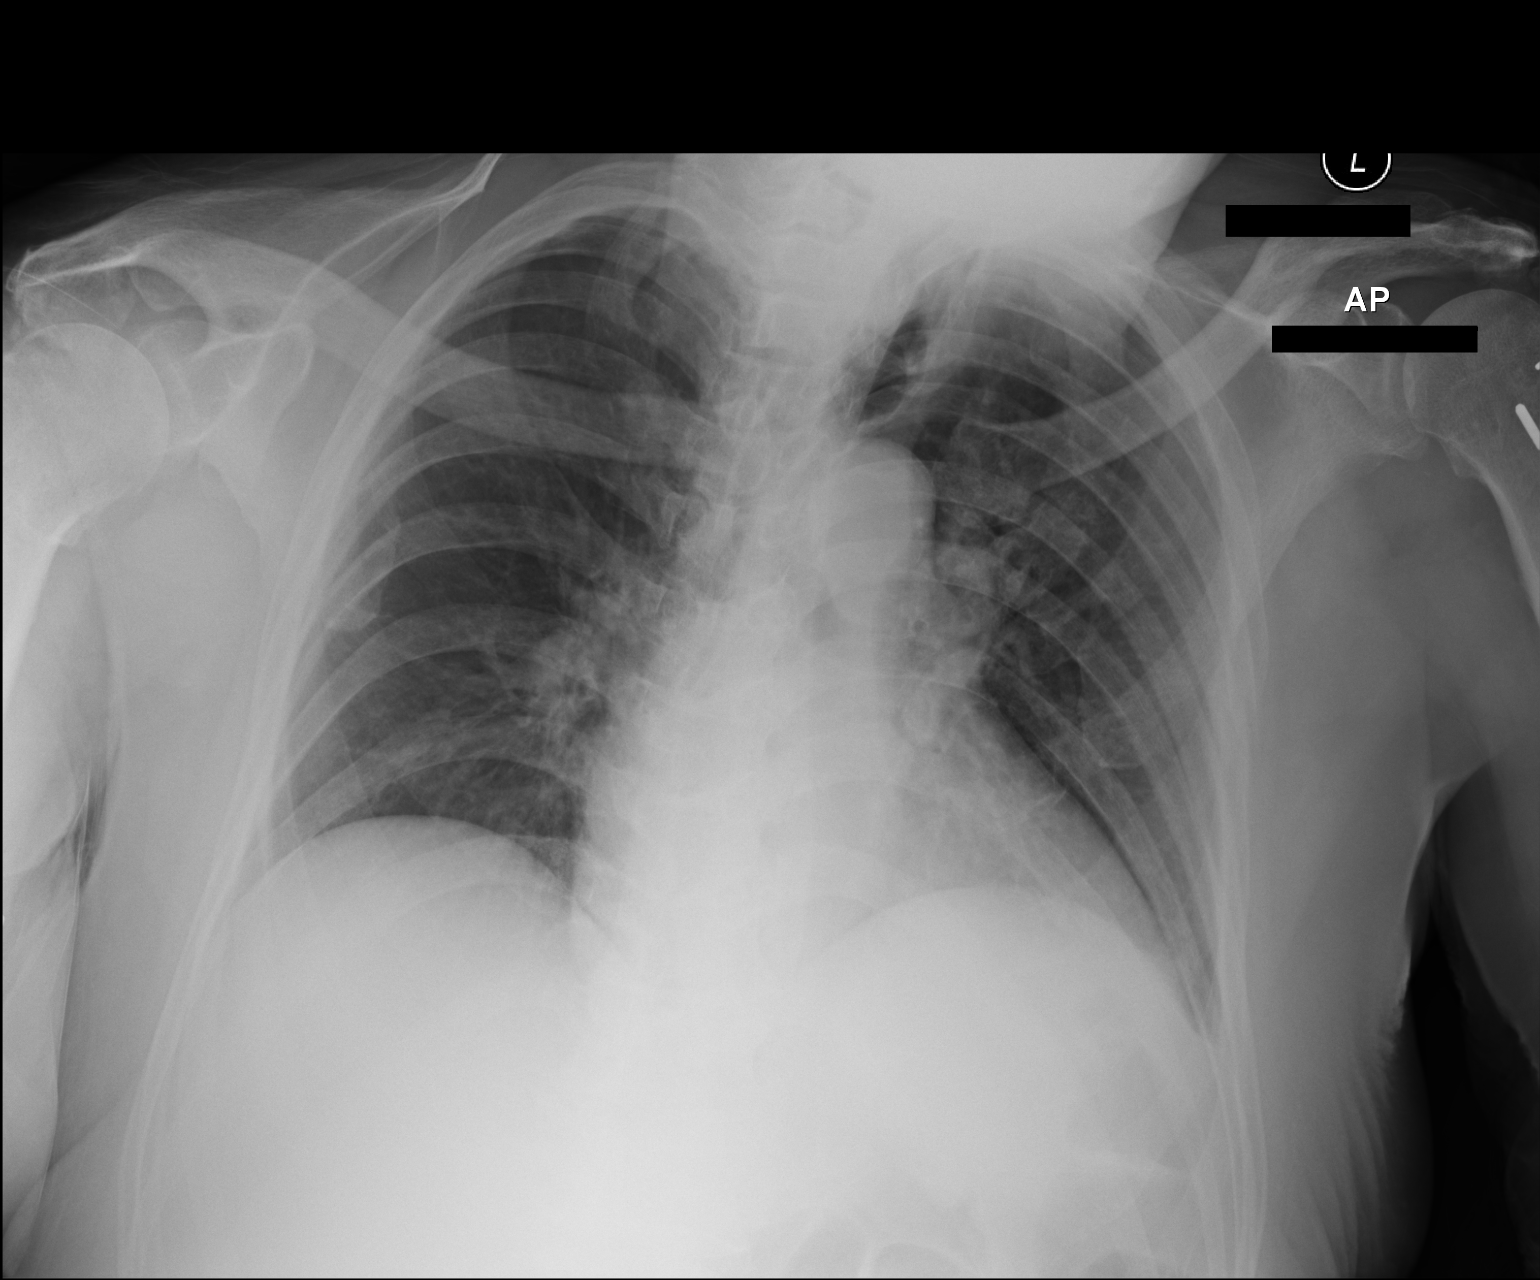

[1 of 1 positions shown; findings below may reference images not displayed]

FINDINGS: The heart size and mediastinal contours are within normal limits.
Both lungs are clear. The visualized skeletal structures are
unremarkable.
IMPRESSION: No active disease.

## 2014-09-06 LAB — CULTURE, BLOOD (ROUTINE X 2)
Culture: NO GROWTH
Culture: NO GROWTH

## 2014-09-07 NOTE — Clinical Social Work Psychosocial (Addendum)
Clinical Social Work Department BRIEF PSYCHOSOCIAL ASSESSMENT 09/01/2014  Patient:  Joel [name], Joel [name]     Account Number:  [id number]     Admit date:  08/22/2014  Clinical Social Worker:  Elam Dutch  Date/Time:  09/01/2014 01:55 PM  Referred by:  Physician  Date Referred:  08/31/2014 Referred for  Residential hospice placement   Other Referral:   Interview type:  Family Other interview type:   Patient is not responsive    PSYCHOSOCIAL DATA Living Status:  WITH ADULT CHILDREN Admitted from facility:   Level of care:   Primary support name:  Marris Frontera- daughter  [phone] Primary support relationship to patient:  CHILD, ADULT Degree of support available:   Strong support    CURRENT CONCERNS Current Concerns  Other - See comment   Other Concerns:   Home with [facility] / PLAN 80 year old male- admitted from home where he lives with his daughter Katharine Look and is followed by [facility].  Patient was scheduled to return home with daughter until the d/c plan changed to residential hospice placement. Daughter met extesively with Hospice SW and RN and they communicated with patient's daughter in [place] as well. After a very lengthy discussion (patient has been stable for d/c since yesterday)- daugher agreed to residential hospice and move to [facility] arranged.   Assessment/plan status:  No Further Intervention Required Other assessment/ plan:   Information/referral to community resources:   Referral to [facility] completed by Hospice SW    PATIENT'S/FAMILY'S RESPONSE TO PLAN OF CARE: Patient was noted to be unresponsive during each visit today; condition has deteriorated a great deal and he is not eating. He would not speak or open his eyes. MD feels that his condition has definately deteriorated. Daughter is aware of need for comfort care and is now agreeable to residential hospice.  [facility] arranged and CSW notified RN  to call report.  CSW signing off.

## 2014-09-16 DEATH — deceased

## 2014-10-06 IMAGING — DX DG CHEST 1V PORT
1 series · 1 of 1 positions shown · non-contrast
Comparison: 02/19/2014

CLINICAL DATA: Syncope

EXAM:
PORTABLE CHEST - 1 VIEW

[chest ap]
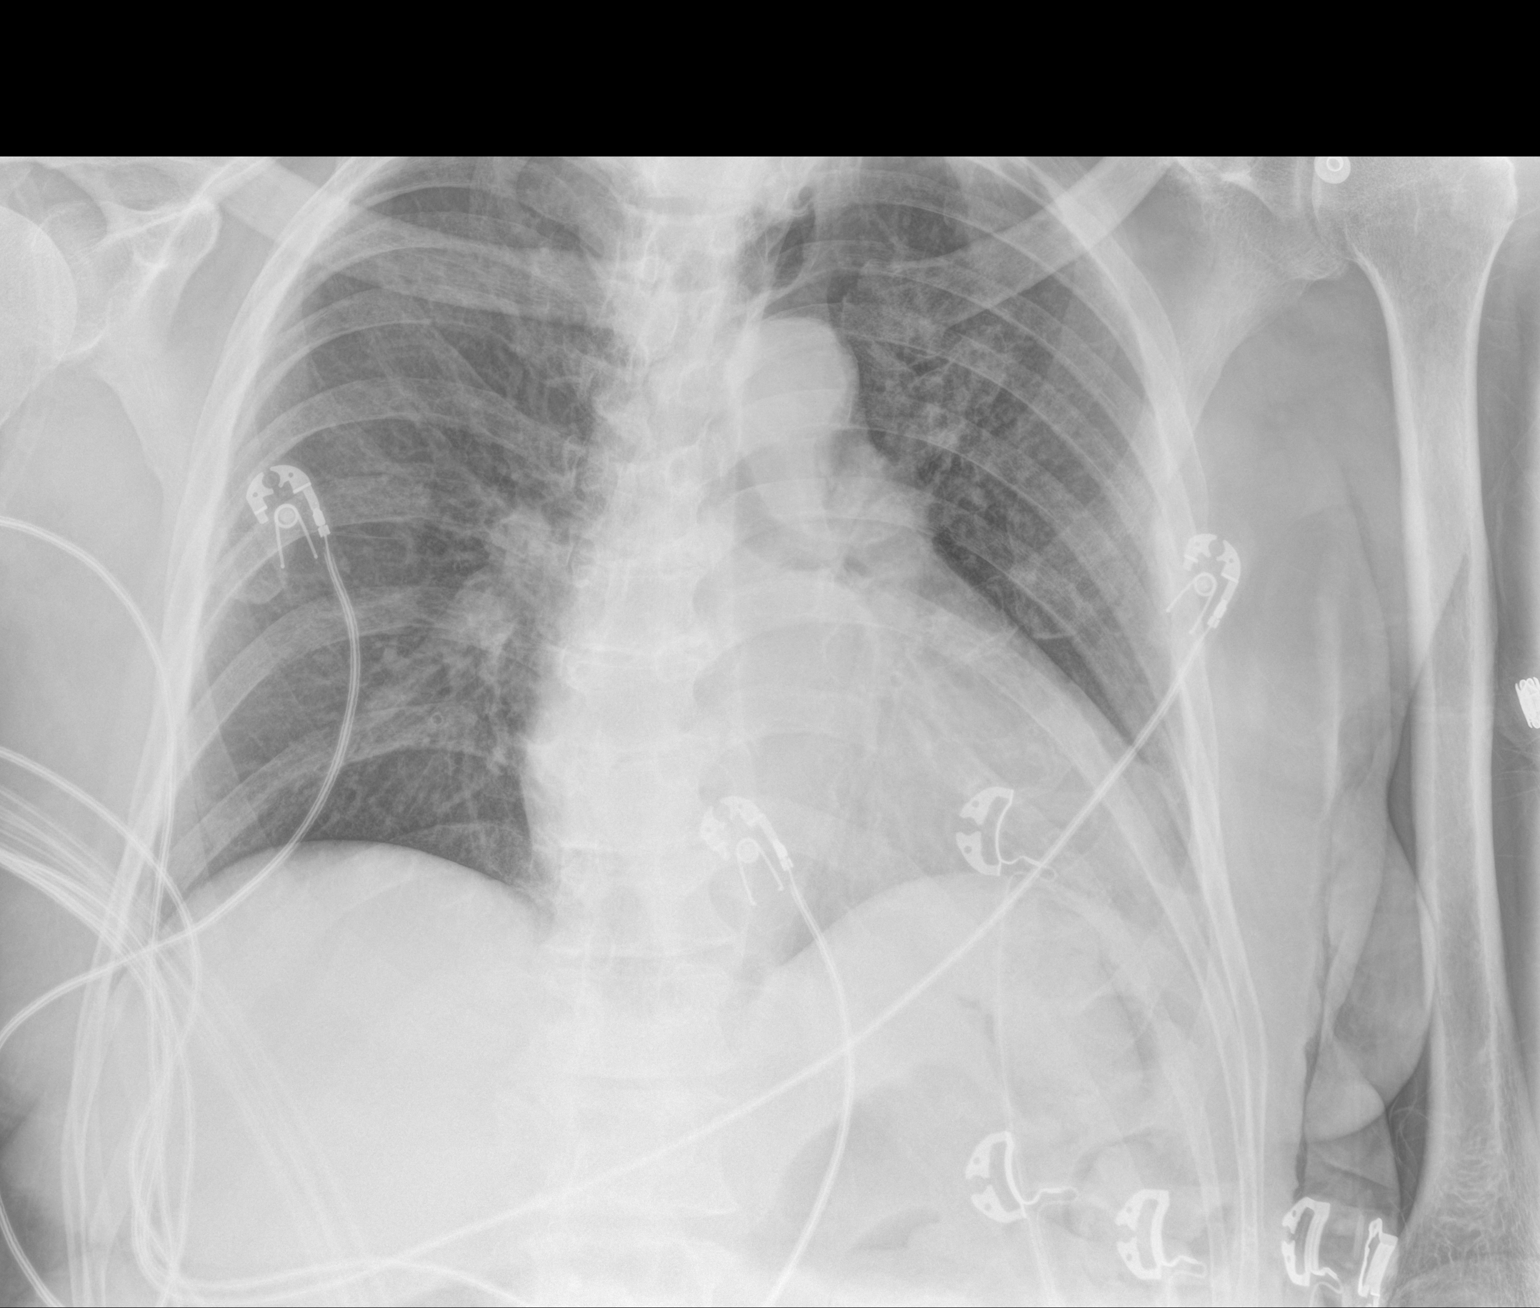

[1 of 1 positions shown; findings below may reference images not displayed]

FINDINGS: Cardiomegaly noted with a slight increase vascular and interstitial
prominence throughout both lungs. Early edema not excluded. No
pneumothorax. No large effusion evident. Minor basilar atelectasis.
Degenerative changes of the spine.
IMPRESSION: Cardiomegaly with vascular and interstitial prominence compared to
the prior study, suspect early edema pattern.

## 2014-10-06 IMAGING — CT CT HEAD W/O CM
1 series · 16 of 30 positions shown, 20 images · non-contrast
Comparison: 01/19/2014 and MRI dated 01/10/2014

CLINICAL DATA: Loss of consciousness.

EXAM:
CT HEAD WITHOUT CONTRAST
TECHNIQUE: Contiguous axial images were obtained from the base of the skull
through the vertex without intravenous contrast.

[Series 2: head 5.0 h30s · axial · 0.42mm/px · z∈[-133,+7]mm · 16 of 32 slices shown, 20 images]
[im 2/32  brain]
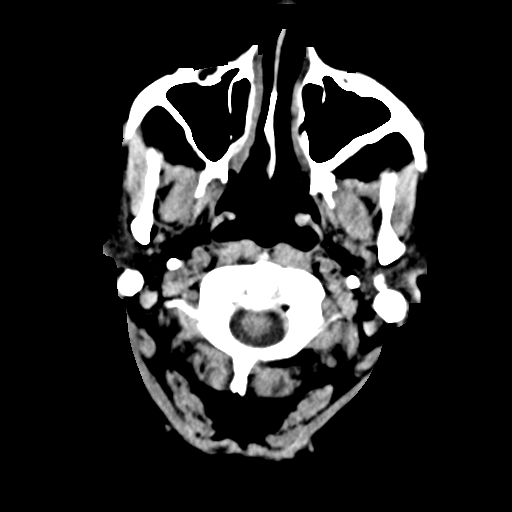
[im 2/32  bone]
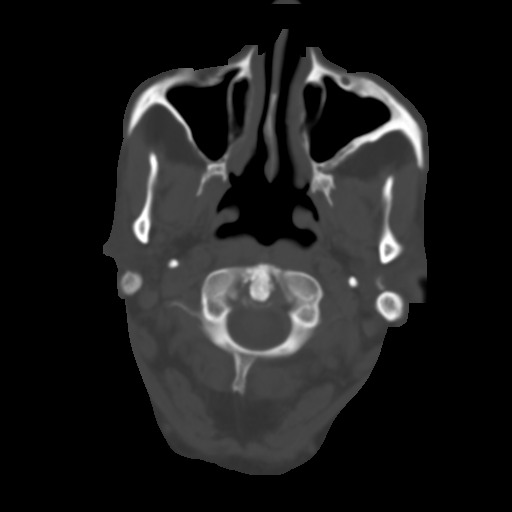
[im 4/32  brain]
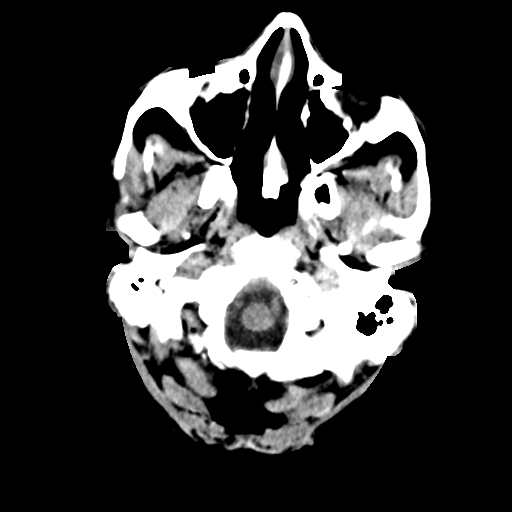
[im 6/32  brain]
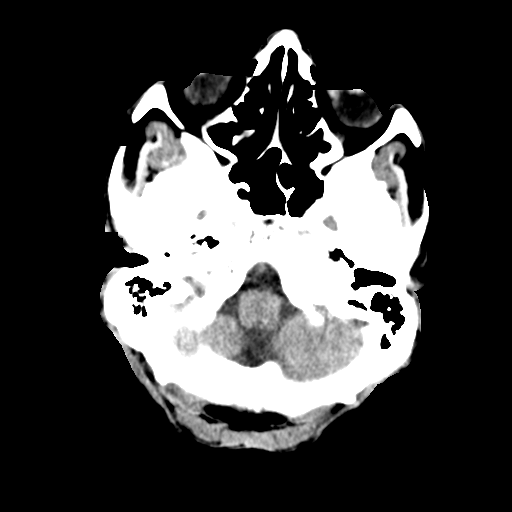
[im 8/32  brain]
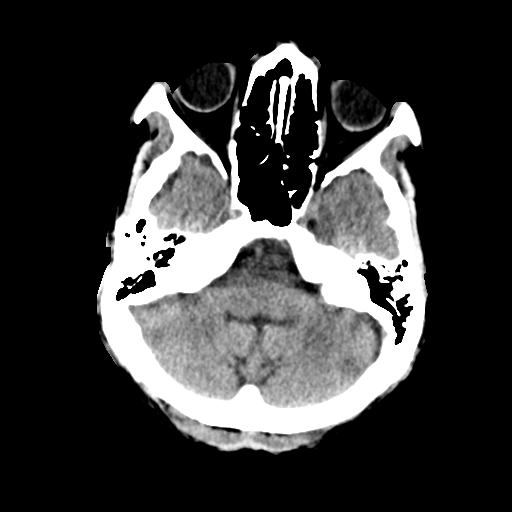
[im 9/32  brain]
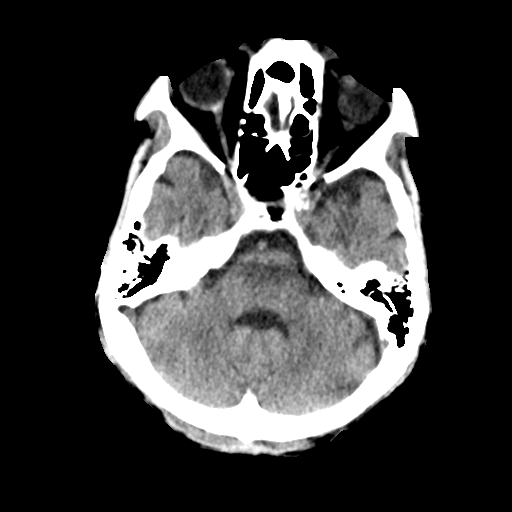
[im 9/32  bone]
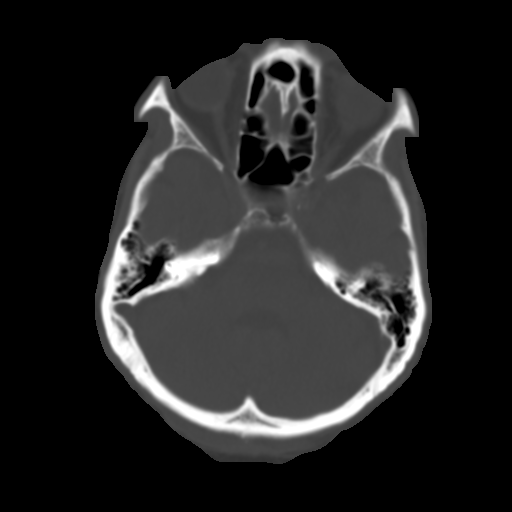
[im 11/32  brain]
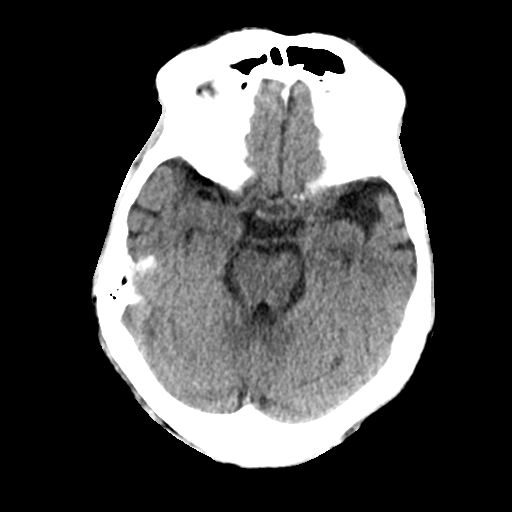
[im 13/32  brain]
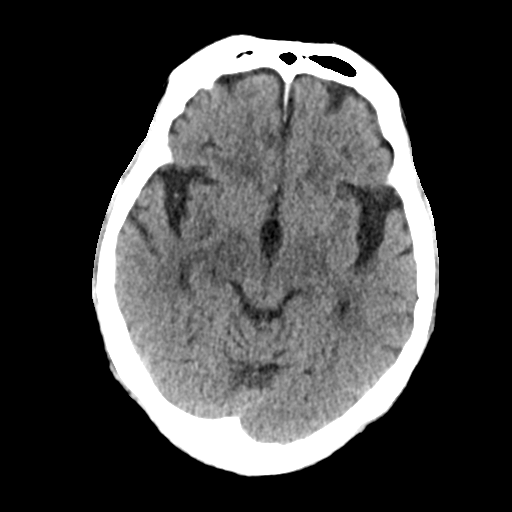
[im 15/32  brain]
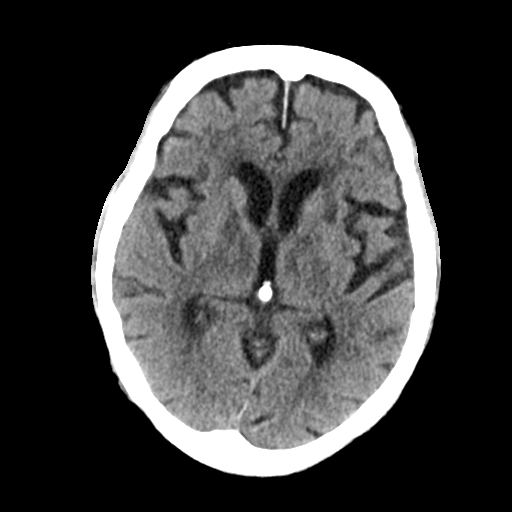
[im 17/32  brain]
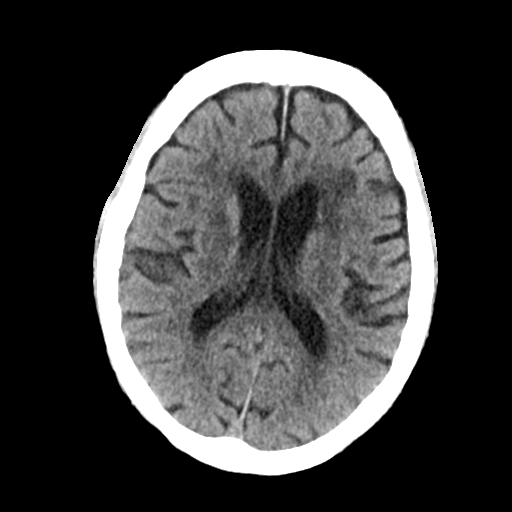
[im 17/32  bone]
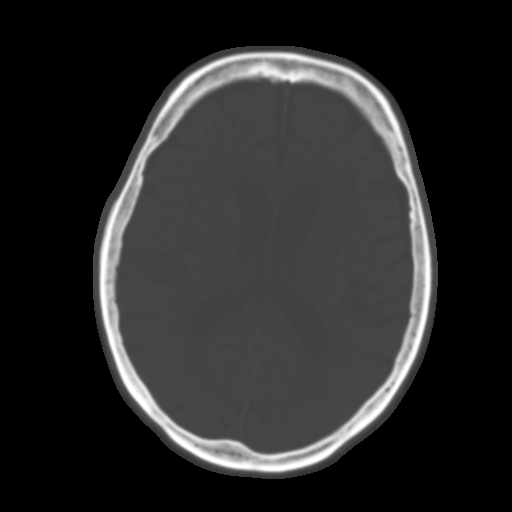
[im 19/32  brain]
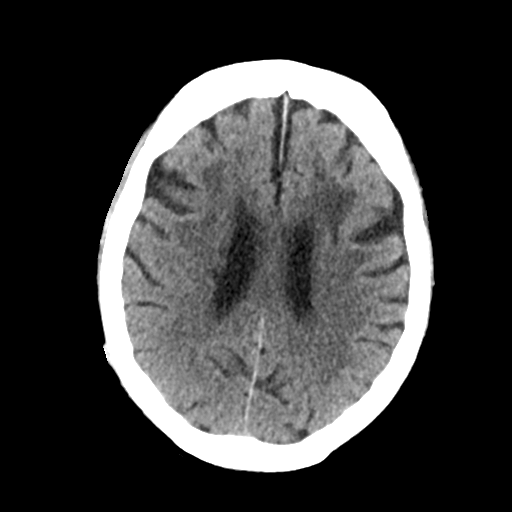
[im 21/32  brain]
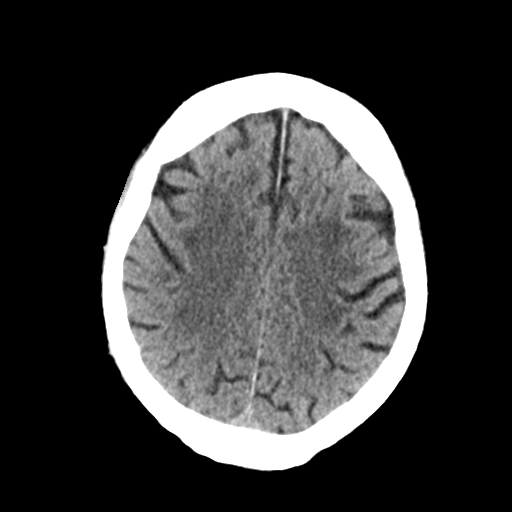
[im 23/32  brain]
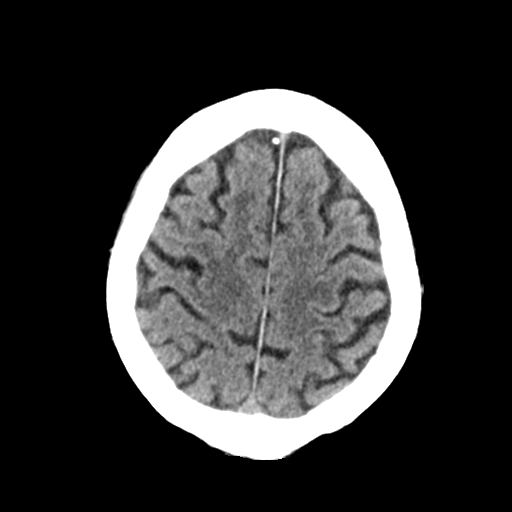
[im 24/32  brain]
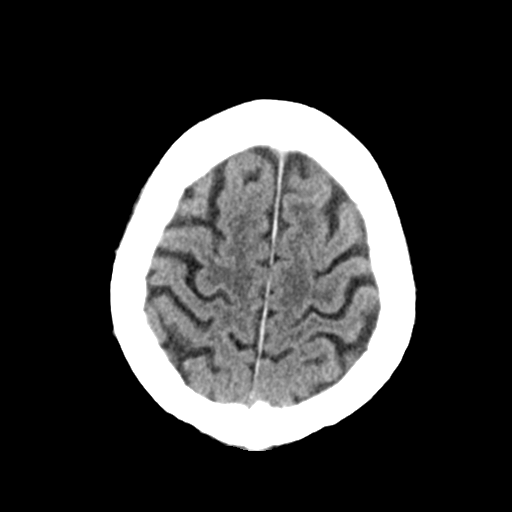
[im 24/32  bone]
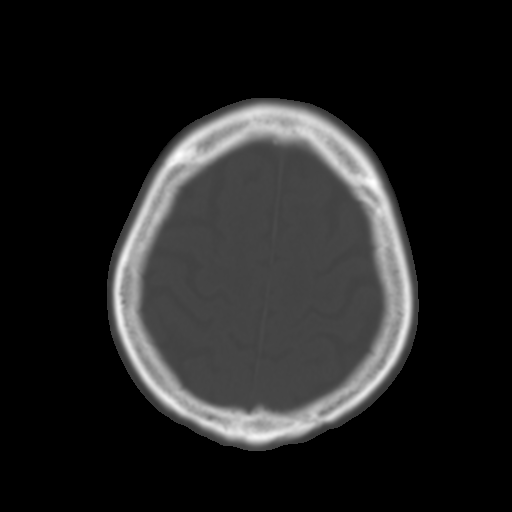
[im 26/32  brain]
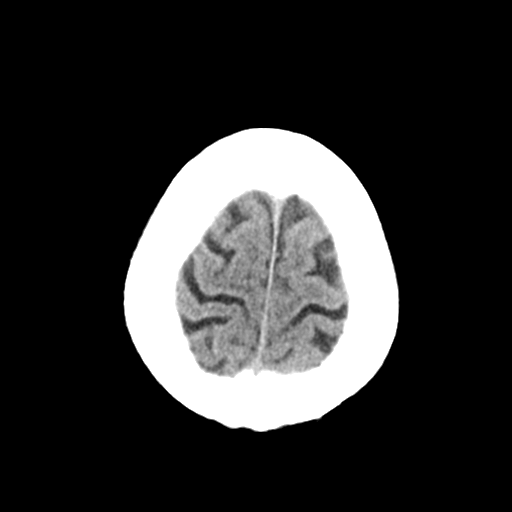
[im 28/32  brain]
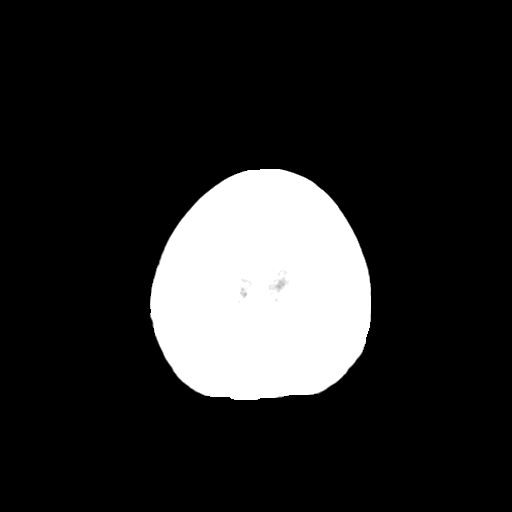
[im 30/32  brain]
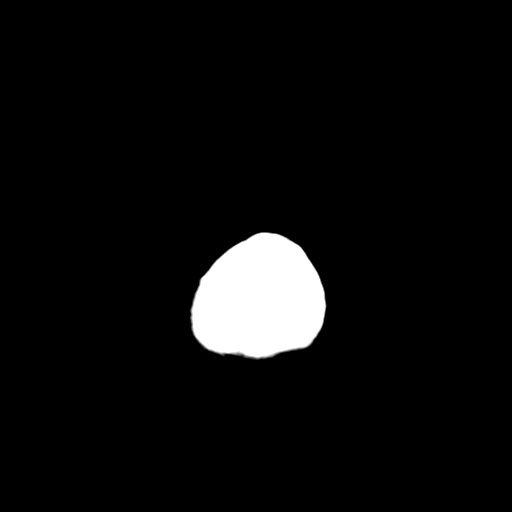

[16 of 30 positions shown; findings below may reference images not displayed]

FINDINGS: No mass lesion. No midline shift. No acute hemorrhage or hematoma.
No extra-axial fluid collections. No evidence of acute infarction.
There are numerous old bilateral basal ganglia and periventricular
white matter infarcts, unchanged since the prior exams. There is
diffuse mild atrophy, most severe in the temporal lobes, stable.

No osseous abnormality.
IMPRESSION: No acute intracranial abnormality. Stable appearance of the brain
since the prior study. Multiple old infarcts.
